# Patient Record
Sex: Male | Born: 1960 | Race: White | Hispanic: No | Marital: Married | Smoking: Current every day smoker
Health system: Southern US, Community
[De-identification: ages and names within clinical notes are randomized; demographics above are authoritative.]

## PROBLEM LIST (undated history)

## (undated) DIAGNOSIS — F32A Depression, unspecified: Secondary | ICD-10-CM

## (undated) DIAGNOSIS — J449 Chronic obstructive pulmonary disease, unspecified: Secondary | ICD-10-CM

## (undated) DIAGNOSIS — K219 Gastro-esophageal reflux disease without esophagitis: Secondary | ICD-10-CM

## (undated) DIAGNOSIS — N200 Calculus of kidney: Secondary | ICD-10-CM

## (undated) DIAGNOSIS — I10 Essential (primary) hypertension: Secondary | ICD-10-CM

## (undated) DIAGNOSIS — I639 Cerebral infarction, unspecified: Secondary | ICD-10-CM

## (undated) DIAGNOSIS — M1990 Unspecified osteoarthritis, unspecified site: Secondary | ICD-10-CM

## (undated) DIAGNOSIS — F329 Major depressive disorder, single episode, unspecified: Secondary | ICD-10-CM

## (undated) HISTORY — DX: Cerebral infarction, unspecified: I63.9

## (undated) HISTORY — PX: CHOLECYSTECTOMY: SHX55

## (undated) HISTORY — DX: Depression, unspecified: F32.A

## (undated) HISTORY — DX: Major depressive disorder, single episode, unspecified: F32.9

## (undated) HISTORY — DX: Chronic obstructive pulmonary disease, unspecified: J44.9

## (undated) HISTORY — DX: Essential (primary) hypertension: I10

---

## 1998-05-09 ENCOUNTER — Inpatient Hospital Stay (HOSPITAL_COMMUNITY): Admission: EM | Admit: 1998-05-09 | Discharge: 1998-05-11 | Payer: Self-pay | Admitting: Emergency Medicine

## 1998-05-20 ENCOUNTER — Encounter: Admission: RE | Admit: 1998-05-20 | Discharge: 1998-05-20 | Payer: Self-pay | Admitting: Hematology and Oncology

## 1998-10-29 ENCOUNTER — Emergency Department (HOSPITAL_COMMUNITY): Admission: EM | Admit: 1998-10-29 | Discharge: 1998-10-29 | Payer: Self-pay | Admitting: Emergency Medicine

## 1998-10-29 ENCOUNTER — Encounter: Payer: Self-pay | Admitting: Emergency Medicine

## 1999-01-06 ENCOUNTER — Emergency Department (HOSPITAL_COMMUNITY): Admission: EM | Admit: 1999-01-06 | Discharge: 1999-01-06 | Payer: Self-pay | Admitting: Emergency Medicine

## 2000-01-20 ENCOUNTER — Emergency Department (HOSPITAL_COMMUNITY): Admission: EM | Admit: 2000-01-20 | Discharge: 2000-01-20 | Payer: Self-pay | Admitting: Emergency Medicine

## 2001-12-24 ENCOUNTER — Encounter: Payer: Self-pay | Admitting: Emergency Medicine

## 2001-12-24 ENCOUNTER — Inpatient Hospital Stay (HOSPITAL_COMMUNITY): Admission: EM | Admit: 2001-12-24 | Discharge: 2001-12-25 | Payer: Self-pay | Admitting: Emergency Medicine

## 2004-01-29 ENCOUNTER — Emergency Department (HOSPITAL_COMMUNITY): Admission: EM | Admit: 2004-01-29 | Discharge: 2004-01-29 | Payer: Self-pay | Admitting: Emergency Medicine

## 2004-08-09 ENCOUNTER — Emergency Department (HOSPITAL_COMMUNITY): Admission: EM | Admit: 2004-08-09 | Discharge: 2004-08-09 | Payer: Self-pay | Admitting: Emergency Medicine

## 2004-08-11 ENCOUNTER — Inpatient Hospital Stay (HOSPITAL_COMMUNITY): Admission: EM | Admit: 2004-08-11 | Discharge: 2004-08-13 | Payer: Self-pay

## 2004-11-18 ENCOUNTER — Ambulatory Visit: Payer: Self-pay | Admitting: Internal Medicine

## 2004-11-18 ENCOUNTER — Inpatient Hospital Stay (HOSPITAL_COMMUNITY): Admission: EM | Admit: 2004-11-18 | Discharge: 2004-11-21 | Payer: Self-pay | Admitting: Emergency Medicine

## 2004-11-24 ENCOUNTER — Ambulatory Visit (HOSPITAL_COMMUNITY): Admission: RE | Admit: 2004-11-24 | Discharge: 2004-11-24 | Payer: Self-pay | Admitting: Internal Medicine

## 2005-02-01 ENCOUNTER — Emergency Department (HOSPITAL_COMMUNITY): Admission: EM | Admit: 2005-02-01 | Discharge: 2005-02-01 | Payer: Self-pay | Admitting: Emergency Medicine

## 2005-02-02 ENCOUNTER — Emergency Department (HOSPITAL_COMMUNITY): Admission: EM | Admit: 2005-02-02 | Discharge: 2005-02-03 | Payer: Self-pay | Admitting: Emergency Medicine

## 2005-10-28 ENCOUNTER — Emergency Department (HOSPITAL_COMMUNITY): Admission: EM | Admit: 2005-10-28 | Discharge: 2005-10-28 | Payer: Self-pay | Admitting: Emergency Medicine

## 2006-08-07 ENCOUNTER — Inpatient Hospital Stay (HOSPITAL_COMMUNITY): Admission: EM | Admit: 2006-08-07 | Discharge: 2006-08-11 | Payer: Self-pay | Admitting: Emergency Medicine

## 2006-08-08 ENCOUNTER — Encounter (INDEPENDENT_AMBULATORY_CARE_PROVIDER_SITE_OTHER): Payer: Self-pay | Admitting: *Deleted

## 2010-09-29 ENCOUNTER — Emergency Department (HOSPITAL_COMMUNITY)
Admission: EM | Admit: 2010-09-29 | Discharge: 2010-09-29 | Payer: Self-pay | Source: Home / Self Care | Admitting: Emergency Medicine

## 2010-10-03 ENCOUNTER — Emergency Department (HOSPITAL_COMMUNITY)
Admission: EM | Admit: 2010-10-03 | Discharge: 2010-10-03 | Payer: Self-pay | Source: Home / Self Care | Admitting: Emergency Medicine

## 2010-12-30 ENCOUNTER — Emergency Department (HOSPITAL_COMMUNITY)
Admission: EM | Admit: 2010-12-30 | Discharge: 2010-12-30 | Disposition: A | Discharge status: Home / Self Care | Payer: Self-pay | Attending: Emergency Medicine | Admitting: Emergency Medicine

## 2010-12-30 ENCOUNTER — Emergency Department (HOSPITAL_COMMUNITY): Payer: Self-pay

## 2010-12-30 DIAGNOSIS — R05 Cough: Secondary | ICD-10-CM | POA: Insufficient documentation

## 2010-12-30 DIAGNOSIS — K219 Gastro-esophageal reflux disease without esophagitis: Secondary | ICD-10-CM | POA: Insufficient documentation

## 2010-12-30 DIAGNOSIS — J209 Acute bronchitis, unspecified: Secondary | ICD-10-CM | POA: Insufficient documentation

## 2010-12-30 DIAGNOSIS — R059 Cough, unspecified: Secondary | ICD-10-CM | POA: Insufficient documentation

## 2011-01-04 LAB — URINE MICROSCOPIC-ADD ON

## 2011-01-04 LAB — HEPATIC FUNCTION PANEL
ALT: 18 U/L (ref 0–53)
AST: 17 U/L (ref 0–37)
Albumin: 4 g/dL (ref 3.5–5.2)
Alkaline Phosphatase: 75 U/L (ref 39–117)
Bilirubin, Direct: 0.1 mg/dL (ref 0.0–0.3)
Indirect Bilirubin: 0.7 mg/dL (ref 0.3–0.9)
Total Bilirubin: 0.8 mg/dL (ref 0.3–1.2)
Total Protein: 8 g/dL (ref 6.0–8.3)

## 2011-01-04 LAB — BASIC METABOLIC PANEL
BUN: 24 mg/dL — ABNORMAL HIGH (ref 6–23)
BUN: 30 mg/dL — ABNORMAL HIGH (ref 6–23)
CO2: 21 mEq/L (ref 19–32)
CO2: 25 mEq/L (ref 19–32)
Calcium: 9.4 mg/dL (ref 8.4–10.5)
Calcium: 9.8 mg/dL (ref 8.4–10.5)
Chloride: 102 mEq/L (ref 96–112)
Chloride: 103 mEq/L (ref 96–112)
Creatinine, Ser: 0.91 mg/dL (ref 0.4–1.5)
Creatinine, Ser: 1.05 mg/dL (ref 0.4–1.5)
GFR calc Af Amer: >60 mL/min (ref >60)
GFR calc Af Amer: >60 mL/min (ref >60)
GFR calc non Af Amer: >60 mL/min (ref >60)
GFR calc non Af Amer: >60 mL/min (ref >60)
Glucose, Bld: 95 mg/dL (ref 70–99)
Glucose, Bld: 97 mg/dL (ref 70–99)
Potassium: 3.8 mEq/L (ref 3.5–5.1)
Potassium: 4.1 mEq/L (ref 3.5–5.1)
Sodium: 137 mEq/L (ref 135–145)
Sodium: 140 mEq/L (ref 135–145)

## 2011-01-04 LAB — URINALYSIS, ROUTINE W REFLEX MICROSCOPIC
Glucose, UA: NEGATIVE mg/dL
Ketones, ur: 40 mg/dL — AB
Leukocytes, UA: NEGATIVE
Nitrite: NEGATIVE
Protein, ur: 30 mg/dL — AB
Specific Gravity, Urine: >1.03 — ABNORMAL HIGH (ref 1.005–1.030)
Urobilinogen, UA: 1 mg/dL (ref 0.0–1.0)
pH: 6 (ref 5.0–8.0)

## 2011-01-04 LAB — CBC
HCT: 44.7 % (ref 39.0–52.0)
Hemoglobin: 16.2 g/dL (ref 13.0–17.0)
MCH: 32 pg (ref 26.0–34.0)
MCHC: 36.2 g/dL — ABNORMAL HIGH (ref 30.0–36.0)
MCV: 88.2 fL (ref 78.0–100.0)
Platelets: 303 10*3/uL (ref 150–400)
RBC: 5.07 MIL/uL (ref 4.22–5.81)
RDW: 12.8 % (ref 11.5–15.5)
WBC: 9 10*3/uL (ref 4.0–10.5)

## 2011-01-04 LAB — DIFFERENTIAL
Basophils Absolute: 0 10*3/uL (ref 0.0–0.1)
Basophils Relative: 0 % (ref 0–1)
Eosinophils Absolute: 0 10*3/uL (ref 0.0–0.7)
Eosinophils Relative: 0 % (ref 0–5)
Lymphocytes Relative: 20 % (ref 12–46)
Lymphs Abs: 1.8 10*3/uL (ref 0.7–4.0)
Monocytes Absolute: 0.5 10*3/uL (ref 0.1–1.0)
Monocytes Relative: 5 % (ref 3–12)
Neutro Abs: 6.7 10*3/uL (ref 1.7–7.7)
Neutrophils Relative %: 75 % (ref 43–77)

## 2011-01-04 LAB — LIPASE, BLOOD: Lipase: 24 U/L (ref 11–59)

## 2011-03-12 NOTE — H&P (Signed)
[name], [name]                  ACCOUNT NO.:  [id number]   MEDICAL RECORD NO.:  [id number]          PATIENT TYPE:  INP   LOCATION:  1826                         FACILITY:  [facility]   PHYSICIAN:  Kela Millin, M.D.DATE OF BIRTH:  02-22-1961   DATE OF ADMISSION:  08/06/2006  DATE OF DISCHARGE:                                HISTORY & PHYSICAL   CHIEF COMPLAINT:  Persistent nausea and vomiting.   HISTORY OF PRESENT ILLNESS:  The patient is a 50 year old white male with  past medical history significant for GERD. He states that he just started  taking erythromycin today for ? bronchitis (patient is unclear of  diagnosis). He presents with worsening nausea and vomiting x1 day. He states  that he has been vomiting about every 35-45 minutes and that he has also  been having associated abdominal pain lasting about an hour at a time,  crampy in nature, 10/10 in intensity and diffuse. He denies fevers, chest  pain, dysuria, diarrhea, melena and hematemesis. He admits to a mild cough  productive of phlegm. He was seen in the ER and a Gastroccult was positive.  His lipase was within normal limits at 20. Urinalysis was unremarkable. LFTs  within normal limits. His white cell count 12.3 and his H&H stable at 17.5  and 51.2. He is admitted to the [facility] for further  evaluation and management. The patient had abdominal/chest x-ray done which  showed no acute cardiopulmonary disease, nonspecific bowel gas pattern, no  obstruction/free air, status post cholecystectomy noted.   PAST MEDICAL HISTORY:  1. As stated above.  2. History of kidney stones.  3. History of COPD.   MEDICATIONS:  1. Prilosec.  2. Erythromycin.  3. Rondec DM.   ALLERGIES:  PENICILLIN.   SOCIAL HISTORY:  He states that he quit tobacco 1 week ago. He denies  alcohol.   FAMILY HISTORY:  His dad has hypertension and heart problems.   REVIEW OF SYSTEMS:  As per HPI. Other review of systems  negative.   PHYSICAL EXAMINATION:  GENERAL:  The patient is a middle-aged white male. He  is alert and oriented, in no apparent distress.  VITAL SIGNS:  His temperature is 97.2, blood pressure 146/90, pulse 93, O2  sat of 99%.  HEENT:  PERRL. EOMI. Dry mucous membranes. No oral exudates.  NECK:  Supple. No adenopathy. No JVD.  LUNGS:  Moderate air movement. No crackles and no wheezes.  CARDIOVASCULAR:  Regular rate and rhythm. Normal S1 and S2.  ABDOMEN:  Epigastric tenderness. Bowel sounds present. Nondistended. No  organomegaly. No rebound tenderness. No masses palpable.  EXTREMITIES:  No cyanosis and no edema.  NEURO:  Alert and oriented x3. Cranial nerves 2-12 grossly intact. Nonfocal  exam.   LABORATORY DATA:  White cell count is 12.3, hemoglobin 17.5, hematocrit  51.2, platelet count 271, neutrophil count 85%. Sodium is 137, potassium  3.7, chloride 101, CO2 18, glucose 114, BUN 35, creatinine 1.1. His LFTs are  unremarkable. Lipase is 20. Gastroccult is positive. Urinalysis is  unremarkable. The chest/abdominal x-rays show no acute  cardiopulmonary  disease, nonspecific bowel gas pattern, no obstruction/free air, status post  chole.   ASSESSMENT/PLAN:  1. Persistent nausea and vomiting with abdominal pain. Mostly epigastric      on exam. As discussed above he has a history of gastroesophageal reflux      disease and also was just started on erythromycin prior to symptom      onset. I will keep the patient nothing by mouth, discontinue      erythromycin. The abdominal films show a nonspecific gas pattern with      no obstruction or free air. I will place the patient on a proton pump      inhibitor and Reglan. Per emergency room emesis, Gastroccult positive.      Will follow hemoglobin and hematocrit. Also noted above the lipase is      within normal limits, urinalysis is unremarkable and the patient is      status post cholecystectomy. Will check cardiac enzymes. Follow and       then consider Gastroenterology consult. Will follow up hemoglobins.  2. History of chronic obstructive pulmonary disease, as needed      bronchodilators.  3. Azotemia, secondary to #1. Hydrate and follow.      Kela Millin, M.D.  Electronically Signed     ACV/MEDQ  D:  08/07/2006  T:  08/07/2006  Job:  [id number]

## 2011-03-12 NOTE — Consult Note (Signed)
[name], [name]                  ACCOUNT NO.:  [id number]   MEDICAL RECORD NO.:  [id number]          PATIENT TYPE:  INP   LOCATION:  A323                          FACILITY:  [facility]   PHYSICIAN:  Lionel December, M.D.    DATE OF BIRTH:  [birth year]   DATE OF CONSULTATION:  11/19/2004  DATE OF DISCHARGE:                                   CONSULTATION   REASON FOR CONSULTATION:  Abnormal ultrasound.   HISTORY OF PRESENT ILLNESS:  The patient is a 50 year old Caucasian male who  was admitted with a two day history of epigastric pain, nausea and vomiting,  and possible coffee-ground emesis.  He states Tuesday he developed a severe  headache.  He went to bed and woke up at 1 a.m. in the morning with multiple  episodes of vomiting.  His symptoms persisted throughout Wednesday and  actually worsened.  He came into the emergency department, where he was  evaluated.  He vomited some brownish emesis.  His stool was heme-negative by  rectal examination in the ED.  Today he has no further nausea and vomiting  or abdominal pain.  He states he has had abdominal problems for quite some  time.  He had his gallbladder out two years ago in [place] for  cholelithiasis.  He has had at least two upper endoscopies at [facility], which he reports to be normal.  He also reports a normal  colonoscopy at some point.  He does have gastroesophageal reflux disease and  takes intermittent PPI therapy when he can afford it or obtain samples.  Currently he is on ranitidine 75 mg three times a day.   He had an abdominal ultrasound today, which revealed mild intrahepatic  biliary dilatation and mild hepatomegaly.  The common bile duct measured 5.5  mm.  There was mild fullness of the pancreatic duct and a possible small  stone in the common bile duct of the pancreatic head.  He is status post  cholecystectomy.  His LFTs have been normal twice since admission.  His  amylase and lipase are also  normal.  His urine drug screen was positive for  opiates and tetrahydrocannabinoids.  Notably, he had an abdominal ultrasound  in October 2005, which was unremarkable.   MEDICATIONS PRIOR TO ADMISSION:  Ranitidine 75 mg t.i.d. p.r.n.   ALLERGIES:  PENICILLIN causes hives.   PAST MEDICAL HISTORY:  1.  Gastroesophageal reflux disease.  2.  He was admitted for acute gastroenteritis in October 1975.  3.  He has a history of kidney stones.  4.  Status post cholecystectomy as outlined above.   FAMILY HISTORY:  Significant for leukemia but no GI illnesses, liver  disease, or colorectal cancer.   SOCIAL HISTORY:  He is married to his second wife.  He has two biological  children from his first wife.  He denies any alcohol use, although he was a  heavy user in the past.  He quit over 16 years ago.  He smokes two packs of  cigarettes daily and has smoked for most of his life.  He occasionally  smokes marijuana.  He denies any other drug use.   REVIEW OF SYSTEMS:  GASTROINTESTINAL :  See HPI.  In addition, he denies any  melena or rectal bleeding, constipation, diarrhea, or dysphasia.  CARDIOPULMONARY:  He denies any chest pain or shortness of breath.  GENITOURINARY:  He denies any dysuria.   PHYSICAL EXAMINATION:  VITAL SIGNS:  Temperature 98.3, pulse 100,  respirations 20, blood pressure 159/81.  Height 68 inches, weight 156.5.  GENERAL:  A pleasant, thin, Caucasian male in no acute distress.  SKIN:  Warm and dry.  No jaundice.  HEENT:  Conjunctivae were pink.  Sclerae nonicteric.  Oropharyngeal mucosa  moist and pink.  NECK:  No lymphadenopathy.  LUNGS:  Clear to auscultation after multiple coughs.  CARDIAC:  Reveals a regular rate and rhythm.  No murmurs, rubs, or gallops.  ABDOMEN:  Positive bowel sounds.  Soft, nontender, nondistended.  No  organomegaly or masses.  EXTREMITIES:  No edema.   LABORATORIES:  WBC 10,000 down from 12,700 on admission, hemoglobin 16.1,  hematocrit  45.3, platelets 287,000.  Total bilirubin 0.5, alkaline  phosphatase 72, AST 24, ALT 26, albumin 4.2, amylase 122, lipase 15.  Please  note LFTs were also normal on admission.  Sodium 132, potassium 3.5, BUN 16,  creatinine 1, glucose 121.  Urine drug screen positive opiates and  marijuana.  Alcohol level less than 5.  Urinalysis positive for ketones and  protein.  Acute abdominal series negative.   IMPRESSION:  The patient is a 50 year old gentleman with epigastric pain and  nausea and vomiting felt to be due to biliary colic.  An abdominal  ultrasound report suggest choledocholithiasis.  This would explain his  symptoms.  The patient is currently asymptomatic.  His liver function tests,  amylase, and lipase have been normal.  Given the liver function tests were  normal, endoscopic retrograde cholangiopancreatography would not be  indicated at this time.   RECOMMENDATIONS:  1.  Recommend and MRCP given his LFTs have been normal.  If he is found to      indeed have a common bile duct stone, he will then need to have an ERCP      for therapeutic measures.  2.  We will allow him to have a clear liquid diet.   I would like to thank Dr. Felecia Shelling for allowing [place] to take part in the care of  this patient.      LL/MEDQ  D:  11/19/2004  T:  11/19/2004  Job:  [id number]

## 2011-03-12 NOTE — Discharge Summary (Signed)
NAME:  [name], [name]                  ACCOUNT NO.:  [id number]   MEDICAL RECORD NO.:  [id number]          PATIENT TYPE:  INP   LOCATION:  A323                          FACILITY:  [facility]   PHYSICIAN:  Tesfaye D. Felecia Shelling, MD   DATE OF BIRTH:  Jan 19, 1961   DATE OF ADMISSION:  11/18/2004  DATE OF DISCHARGE:  01/28/2006LH                                 DISCHARGE SUMMARY   DISCHARGE DIAGNOSES:  1.  Nausea and vomiting, probably secondary to viral gastroenteritis.  2.  History of kidney stone.  3.  Gastroesophageal reflux disease.   DISCHARGE MEDICATIONS:  Protonix 40 mg p.o. q.d.   DISPOSITION:  The patient was discharged home in stable condition.   HOSPITAL COURSE:  This is a 50 year old male patient with no significant  past medical history who was admitted to nausea and vomiting. He was started  on IV fluids and was kept NPO. A GI consult was done. Ultrasound of the  abdomen showed mild dilatation of common bile duct; however, MRCP was  normal. The patient's symptoms resolved. He was back to his baseline. The  patient was evaluated and followed by GI. He was discharged in stable  condition. The patient is advised to be further evaluated by  gastroenterologist.      TDF/MEDQ  D:  12/16/2004  T:  12/16/2004  Job:  [id number]

## 2011-03-12 NOTE — Discharge Summary (Signed)
[name], [name]                  ACCOUNT NO.:  [id number]   MEDICAL RECORD NO.:  [id number]          PATIENT TYPE:  INP   LOCATION:  5738                         FACILITY:  [facility]   PHYSICIAN:  Hollice Espy, M.D.DATE OF BIRTH:  [birth year]   DATE OF ADMISSION:  08/06/2006  DATE OF DISCHARGE:  08/11/2006                                 DISCHARGE SUMMARY   CONSULTATIONS:  Dr. Jeanie Sewer of psychiatry and Dr. Elnoria Howard of GI.   DISCHARGE DIAGNOSES:  1. Esophagitis.  2. Gastritis.  3. Nonsteroidal antiinflammatory drugs causing numbers 1 and 2.  4. Benign-appearing 1.7 cm mass on liver unclear by MRI and CT __________      follow up.  5. Depressive disorder.  6. Chronic obstructive pulmonary disease.  7. Tobacco use.  8. Acute renal failure, now resolved.   HOSPITAL COURSE:  The patient is a 50 year old white male who comes with a  history of COPD and frequent use of Goody powder who presented with  localized abdominal pain that was mid epigastric __________ renal failure.  He was admitted for IV hydration __________  treatment and evaluation of  __________  pain.  The patient also had a history of __________      Hollice Espy, M.D.  Electronically Signed     SKK/MEDQ  D:  08/11/2006  T:  08/12/2006  Job:  [id number]

## 2011-03-12 NOTE — Discharge Summary (Signed)
[name], [name] NO.:  [id number]   MEDICAL RECORD NO.:  [id number]           PATIENT TYPE:   LOCATION:                                 FACILITY:   PHYSICIAN:  Hollice Espy, M.D.    DATE OF BIRTH:   DATE OF ADMISSION:  DATE OF DISCHARGE:                               DISCHARGE SUMMARY   DISCHARGE DIAGNOSES:  1. Severe reflux secondary to gastritis.  2. Depression.  3. Chronic obstructive pulmonary disease, stable.  4. Hypertension.   DISCHARGE MEDICATIONS:  1. Prilosec to be increased to 2 times a day for the next month.  2. Percocet 5/325 p.o. q. 8h. p.r.n.  3. Stop all use of  Goody's powder or any other form of NSAID powder.  4. Continue Ventolin and Atrovent inhalers.   ACTIVITY:  As tolerated.   DIET:  Regular diet.   FOLLOWUP:  With his PCP in [place] in 1 to 2 weeks.  As well, [facility] [place] will call him for an appointment to schedule  for help with his depression.   HOSPITAL COURSE:  The patient is a 50 year old white male with a past  medical history of hypertension and depression, previous GERD, who  presented to the emergency room complaining of severe abdominal pain and  epigastric burning.  He also had some nausea and vomiting, and there was  a concern that he may have an abdominal ulcer.  An EGD was done with  antral biopsies showing a narrow esophagus, a small hiatal hernia and  diffuse gastritis but no evidence of any ulceration.  CT abdomen and  pelvis was done which was unremarkable.  It was felt by GI that likely  he had severe gastritis and they recommended doubling his PPI.  In the  meantime, the patient still continued to feel overwhelmed with his  depression issues.  He was evaluated by Psych who recommended treatment  of his chronic headaches and counseling.  Counseling appointments were  set up.  The rest of his issues were stable, and he was discharged home.      Hollice Espy, M.D.  Electronically Signed    SKK/MEDQ  D:  03/07/2007  T:  03/07/2007  Job:  [id number]   cc:   Antonietta Breach, M.D.  Jordan Hawks Elnoria Howard, MD

## 2011-03-12 NOTE — Discharge Summary (Signed)
NAME:  [name], CELLI                  ACCOUNT NO.:  [id number]   MEDICAL RECORD NO.:  [id number]          PATIENT TYPE:  INP   LOCATION:  A330                          FACILITY:  [facility]   PHYSICIAN:  Tesfaye D. Felecia Shelling, MD   DATE OF BIRTH:  04-08-1961   DATE OF ADMISSION:  08/11/2004  DATE OF DISCHARGE:  LH                                 DISCHARGE SUMMARY   CHIEF COMPLAINT:  Nausea and vomiting of three days duration.   HISTORY OF PRESENT ILLNESS:  This is a 50 year old male patient with no  significant past medical history who came to the emergency room with the  above complaint.  The patient had sudden onset of nausea and vomiting four  days back.  He came to the emergency room where he was evaluated, and was  treated and sent home with Phenergan suppository.  For two days, his  symptoms subsided.  Yesterday while he was at work, the patient started  having recurrent nausea and vomiting.  He vomited more than 20 times.  The  patient became very weak and was brought to the emergency room.  He was  evaluated and was started on IV fluid.  His routine labs including amylase  and lipase became within the normal limits.  The patient was admitted for  further treatment.   REVIEW OF SYSTEMS:  The patient has no fever, chills, cough, chest pain,  palpitations, abdominal pain, diarrhea, dysuria, urgency or frequency of  urination.   PAST MEDICAL HISTORY:  The patient has no chronic known medical illness.   MEDICATIONS:  The patient is not on any prescription medications at this  time.   SOCIAL HISTORY:  The patient is self employed.  He smokes about three packs  of cigarettes a day.  No history of alcohol or substance abuse.  The patient  is married.   PHYSICAL EXAMINATION:  GENERAL:  The patient is alert, awake, sick looking.  VITAL SIGNS: Blood pressure 125/93, pulse 103, respiratory rate 24,  temperature 98.1 degrees Fahrenheit.  HEENT:  Pupils are equal and reactive.  NECK:  Supple.  CHEST:  Clear lung field.  Good air entry.  CARDIOVASCULAR:  First and second heart sounds heard.  No murmur, no gallop.  ABDOMEN:  Soft and relaxed.  Bowel sounds are positive.  No mass, no  organomegaly.  No area of tenderness.  EXTREMITIES:  No leg edema.   LABORATORY DATA:  On admission, WBC 12.4, hemoglobin 10.3, hematocrit 47.7.  Platelets 249.  Sodium 135.  Potassium 3.7.  Chloride 99.  Carbon dioxide  28.  Glucose 122.  BUN 28, creatinine 1.2.  Total bilirubin 0.9.  Alkaline  phosphatase 24, AST 20, ALT 19.  Total protein 7.4.  Albumin 4.1.  Calcium  9.2.  Amylase 90.  Lipase 18.  Urinalysis:  Specific gravity 1.020, pH  greater than 9.  Ketones positive.  Protein 30.  The wbc's 0-2 and rbc's 0-  2.   ASSESSMENT:  Acute gastroenteritis, probably viral in etiology.   PLAN:  1.  We will continue the patient on IV  fluid.  2.  We will keep him n.p.o. until his symptoms improve.  3.  We will do an ultrasound of the abdomen.  4.  We will continue the patient on Phenergan 25 mg IV q.6h.     Tesf   TDF/MEDQ  D:  08/12/2004  T:  08/12/2004  Job:  [id number]

## 2011-03-12 NOTE — H&P (Signed)
[facility]  Patient:    [name], [name] Visit Number: [id number] MRN: [id number]          Service Type: MED Location: 5000 [id number] Attending Physician:  Katy Apo. Dictated by:   Wayne C. Dorna Bloom, M.D. Admit Date:  12/24/2001   CC:         Renford Dills, M.D.   History and Physical  CHIEF COMPLAINT:  Vomiting.  HISTORY OF PRESENT ILLNESS:  This is a 50 year old white male who complains of unrelenting vomiting for two weeks, occasional hematemesis, no melena or bowel changes, no bloody bowel movements, positive reflux symptoms, mild lower abdominal discomfort.  He denies fever or weight loss.  Went to a [facility] hospital five times over last week, admitted overnight x1.  Had rectal exam, ultrasound and EGD which were apparently unremarkable.  Patient has been on Phenergan and Prevacid without good effect.  PAST MEDICAL HISTORY:  Unremarkable otherwise.  PAST SURGICAL HISTORY:  None.  REVIEW OF SYSTEMS:  Otherwise negative.  No respiratory or urinary complaints. No reports or eating spoiled foods, antibiotic use, travel outside of the area or recent exposure to illness.  Patient denies headache, dizziness, vision changes, stiff neck.  SOCIAL HISTORY:  Patient is married with children and lives in [place], [place].  Parents [place] in [place].  He is a former smoker. Denies alcohol or recreational drug use.  FAMILY MEDICAL HISTORY:  Father with cardiovascular disease.  PREVIOUS MEDICATIONS:  None prior to this episode.  ALLERGIES:  PENICILLIN, which causes hives.  PHYSICAL EXAMINATION:  GENERAL:  Patient looks well and in no distress whatsoever.  VITAL SIGNS:  Temperature is 97.3, blood pressure is 131/103, pulse is 109 and regular, respiratory rate of 24.  HEENT:  Negative.  CARDIOVASCULAR:  Regular rate and rhythm without murmurs, rubs, or gallops.  LUNGS:  Clear to auscultation bilaterally.  ABDOMEN:   Soft.  Normoactive bowel sounds.  Nontender.  Without organomegaly or masses felt.  RECTAL:  He is heme-negative.  EXTREMITIES:  No clubbing, cyanosis, or edema.  NEUROLOGIC:  Motor strength is 5/5 bilaterally.  Sensation intact to light touch and pinprick.  Cranial nerves II-XII are intact.  LABORATORY AND ACCESSORY DATA:  CT scan shows tiny punctate nonobstructing right renal calculus.  Bowels are negative.  Appendix negative.  Degenerative changes in LS spine.  Disk bulge and right paracentral herniated nucleus pulposus, L4-L5.  Labs showed a sodium of 132, potassium 3.8, BUN 24, creatinine 1.1, chloride 103, CO2 19, glucose of 69.  Liver function tests were essentially normal. Amylase and lipase are normal.  CBC:  White count 10.2, hemoglobin 16.9, hematocrit 50.9, platelet count of 232,000.  Urinalysis showed 80 ketones, total protein 30, negative nitrite, negative hemoglobin, 0-2 white cells, 0-2 red cells and rare bacteria.  ASSESSMENT AND PLAN:  Unrelenting nausea and vomiting, questionable gastroenteritis.  Workup apparently unrevealing so far.  Review the records from [place].  Questionable repeat workup if necessary.  Give intravenous fluids and intravenous Zofran for now.  Gastrointestinal consult considered. Dictated by:   Wayne C. Dorna Bloom, M.D. Attending Physician:  Renford Dills D. DD:  12/25/01 TD:  12/25/01 Job: [id number]

## 2011-03-12 NOTE — H&P (Signed)
NAMEDANNEL, RAFTER                  ACCOUNT NO.:  [id number]   MEDICAL RECORD NO.:  [id number]          PATIENT TYPE:  INP   LOCATION:  A209                          FACILITY:  [facility]   PHYSICIAN:  Tesfaye D. Felecia Shelling, MD   DATE OF BIRTH:  09/13/1961   DATE OF ADMISSION:  11/18/2004  DATE OF DISCHARGE:  LH                                HISTORY & PHYSICAL   CHIEF COMPLAINT:  Nausea/vomiting.   HISTORY OF PRESENT ILLNESS:  This is a 50 year old male patient who has no  significant past medical history.  Came to the emergency room with above  complaints.  The patient developed the sudden onset of nausea and vomiting  over two days.  These symptoms started getting worse as of yesterday.  He  had severe recurrent vomiting yesterday.  He came to the emergency room  where he was evaluated.  The patient claims his vomiting is coffee grounds  in appearance, however, there was no drop in his hemoglobin or hematocrit.  The patient has a similar history in the past for which he was admitted and  treated for gastroenteritis.   REVIEW OF SYSTEMS:  There are no fevers, chills, chest pain, cough,  abdominal pain, diarrhea, melena or hematemesis.  No dysuria or frequency of  urination.   PAST MEDICAL HISTORY:  History of acute viral gastroenteritis.   CURRENT MEDICATIONS:  The patient is not on any regular medications.   SOCIAL HISTORY:  The patient smokes cigarettes heavily.  He is married.  No  history of alcohol or substance abuse.   PHYSICAL EXAMINATION:  GENERAL:  The patient is alert, awake, and sick  looking.  VITALS:  Blood pressure 130/86, pulse 110, respiratory rate 20, temperature  97.7 degrees Fahrenheit.  HEENT:  Pupils are equal, reactive.  NECK:  Supple.  CHEST:  Decreased air entry.  A few rhonchi.  CARDIOVASCULAR:  First and second heart sounds heard.  No murmur, no gallop.  ABDOMEN:  Soft.  Bowel sounds are positive.  No organomegaly.  EXTREMITIES:  No leg edema.   LABS ON  ADMISSION:  CBC:  WBC 12.7, hemoglobin 16.4, hematocrit 46.6,  platelets 288.  BNP:  Sodium 132, potassium 3.7, chloride 99, CO2 24, sodium  121, BUN 16, creatinine 1, bilirubin 0.9, alkaline phosphatase 74, AST 28,  ALT 27, total protein 6.9, albumin 4.6, calcium 9.3, __________ 5.   ASSESSMENT:  This is a 50 year old male patient with no significant past  medical history, admitted with a history of nausea and vomiting.  The  patient's stool guaiac is negative and there is no drop in his  hemoglobin/hematocrit.  The patient probably has viral gastroenteritis.  It  is less likely to be a GI bleed.   PLAN:  Will continue patient on IV fluids.  We will keep him NPO.  We will  give Protonix 40 mg IV piggyback daily.  We will do an amylase and a lipase  level.  We will do an ultrasound of the abdomen.  We will get a GI consult.      TDF/MEDQ  D:  11/19/2004  T:  11/19/2004  Job:  [id number]
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
## 2011-03-12 NOTE — Op Note (Signed)
NAMEBRAELIN, COSTLOW NO.:  [id number]   MEDICAL RECORD NO.:  [id number]          PATIENT TYPE:  INP   LOCATION:  5738                         FACILITY:  [facility]   PHYSICIAN:  Anselmo Rod, M.D.  DATE OF BIRTH:  1961-08-05   DATE OF PROCEDURE:  DATE OF DISCHARGE:                                 OPERATIVE REPORT   PROCEDURE PERFORMED:  Esophagogastroduodenoscopy with antral biopsies.   ENDOSCOPIST:  Anselmo Rod, M.D.   INSTRUMENT USED:  Olympus video panendoscope.   INDICATIONS FOR PROCEDURE:  The patient is a 50 year old white male with a  history of nausea and vomiting.  The patient has a longstanding history of  Goody powder and BC powder use.  Rule out peptic ulcer disease, esophagitis,  gastritis, etc.   PREPROCEDURE PREPARATION:  Informed consent was procured from the patient.  The patient was fasted for four hours prior to the procedure.  Risks and  benefits of the procedure were discussed with the patient in great detail.   PREPROCEDURE PHYSICAL:  The patient had stable vital signs.  Neck supple,  chest clear to auscultation.  S1, S2 regular.  Abdomen soft with normal  bowel sounds.   DESCRIPTION OF PROCEDURE:  The patient was placed in the left lateral  decubitus position and sedated with fentanyl and Versed.  Once the patient  was adequately sedated and maintained on low-flow oxygen and continuous  cardiac monitoring, the Olympus video panendoscope was advanced through the  mouth piece over the tongue into the esophagus under direct vision.  The  entire esophagus was widely patent with no evidence of ring, stricture,  masses, esophagitis or Barrett's mucosa.  The scope was then advanced to the  stomach.  Diffuse gastritis was noted and biopsies were done to rule out  presence of Helicobacter pylori by pathology.  A hiatal hernia was seen on  high retroflexion.  Mild duodenitis was noted in the duodenal bulb.  There  was no evidence of ulcer  disease.  The proximal small bowel distal to the  bulb appeared normal.  There was no outlet obstruction.  The patient  tolerated the procedure well without complications.   IMPRESSION:  1. Normal-appearing esophagus with no evidence of ring, stricture, masses,      esophagitis or Barrett's mucosa.  2. Hiatal hernia present.  3. Antral biopsies done for Helicobacter pylori.  4. Mild duodenitis in the bulb.  5. Normal small bowel distal to the bulb up to 60 cm.   RECOMMENDATIONS:  1. CT scan of the abdomen should also be done to further evaluate the      patient's problem with nausea and vomiting to rule out gastric outlet      obstruction.  2. Continue serial CBCs.  3. Patient has been advised to avoid all nonsteroidals for now.  4. Agree with proton pump inhibitor.      Anselmo Rod, M.D.  Electronically Signed     JNM/MEDQ  D:  08/09/2006  T:  08/11/2006  Job:  [id number]

## 2011-04-13 ENCOUNTER — Emergency Department (HOSPITAL_COMMUNITY)
Admission: EM | Admit: 2011-04-13 | Discharge: 2011-04-13 | Disposition: A | Discharge status: Home / Self Care | Payer: Self-pay | Attending: Emergency Medicine | Admitting: Emergency Medicine

## 2011-04-13 DIAGNOSIS — K2970 Gastritis, unspecified, without bleeding: Secondary | ICD-10-CM | POA: Insufficient documentation

## 2011-04-13 DIAGNOSIS — R1013 Epigastric pain: Secondary | ICD-10-CM | POA: Insufficient documentation

## 2011-04-13 DIAGNOSIS — J4489 Other specified chronic obstructive pulmonary disease: Secondary | ICD-10-CM | POA: Insufficient documentation

## 2011-04-13 DIAGNOSIS — R112 Nausea with vomiting, unspecified: Secondary | ICD-10-CM | POA: Insufficient documentation

## 2011-04-13 DIAGNOSIS — K219 Gastro-esophageal reflux disease without esophagitis: Secondary | ICD-10-CM | POA: Insufficient documentation

## 2011-04-13 DIAGNOSIS — K2990 Gastroduodenitis, unspecified, without bleeding: Secondary | ICD-10-CM | POA: Insufficient documentation

## 2011-04-13 DIAGNOSIS — Z79899 Other long term (current) drug therapy: Secondary | ICD-10-CM | POA: Insufficient documentation

## 2011-04-13 DIAGNOSIS — J449 Chronic obstructive pulmonary disease, unspecified: Secondary | ICD-10-CM | POA: Insufficient documentation

## 2011-04-13 LAB — COMPREHENSIVE METABOLIC PANEL
ALT: 20 U/L (ref 0–53)
AST: 15 U/L (ref 0–37)
Albumin: 4.2 g/dL (ref 3.5–5.2)
Alkaline Phosphatase: 86 U/L (ref 39–117)
BUN: 37 mg/dL — ABNORMAL HIGH (ref 6–23)
CO2: 22 mEq/L (ref 19–32)
Calcium: 10 mg/dL (ref 8.4–10.5)
Chloride: 97 mEq/L (ref 96–112)
Creatinine, Ser: 1.01 mg/dL (ref 0.50–1.35)
GFR calc Af Amer: >60 mL/min (ref >60)
GFR calc non Af Amer: >60 mL/min (ref >60)
Glucose, Bld: 94 mg/dL (ref 70–99)
Potassium: 3.7 mEq/L (ref 3.5–5.1)
Sodium: 133 mEq/L — ABNORMAL LOW (ref 135–145)
Total Bilirubin: 0.5 mg/dL (ref 0.3–1.2)
Total Protein: 8.1 g/dL (ref 6.0–8.3)

## 2011-04-13 LAB — CBC
HCT: 45.2 % (ref 39.0–52.0)
Hemoglobin: 15.9 g/dL (ref 13.0–17.0)
MCH: 30.9 pg (ref 26.0–34.0)
MCHC: 35.2 g/dL (ref 30.0–36.0)
MCV: 87.9 fL (ref 78.0–100.0)
Platelets: 245 10*3/uL (ref 150–400)
RBC: 5.14 MIL/uL (ref 4.22–5.81)
RDW: 13.6 % (ref 11.5–15.5)
WBC: 8.9 10*3/uL (ref 4.0–10.5)

## 2011-04-13 LAB — DIFFERENTIAL
Basophils Absolute: 0 10*3/uL (ref 0.0–0.1)
Basophils Relative: 0 % (ref 0–1)
Eosinophils Absolute: 0 10*3/uL (ref 0.0–0.7)
Eosinophils Relative: 0 % (ref 0–5)
Lymphocytes Relative: 17 % (ref 12–46)
Lymphs Abs: 1.5 10*3/uL (ref 0.7–4.0)
Monocytes Absolute: 0.5 10*3/uL (ref 0.1–1.0)
Monocytes Relative: 5 % (ref 3–12)
Neutro Abs: 6.9 10*3/uL (ref 1.7–7.7)
Neutrophils Relative %: 78 % — ABNORMAL HIGH (ref 43–77)

## 2011-04-13 LAB — LIPASE, BLOOD: Lipase: 19 U/L (ref 11–59)

## 2011-04-18 ENCOUNTER — Emergency Department (HOSPITAL_COMMUNITY): Payer: Self-pay

## 2011-04-18 ENCOUNTER — Emergency Department (HOSPITAL_COMMUNITY)
Admission: EM | Admit: 2011-04-18 | Discharge: 2011-04-18 | Disposition: A | Discharge status: Home / Self Care | Payer: Self-pay | Attending: Emergency Medicine | Admitting: Emergency Medicine

## 2011-04-18 DIAGNOSIS — J4489 Other specified chronic obstructive pulmonary disease: Secondary | ICD-10-CM | POA: Insufficient documentation

## 2011-04-18 DIAGNOSIS — J449 Chronic obstructive pulmonary disease, unspecified: Secondary | ICD-10-CM | POA: Insufficient documentation

## 2011-04-18 DIAGNOSIS — I10 Essential (primary) hypertension: Secondary | ICD-10-CM | POA: Insufficient documentation

## 2011-04-18 DIAGNOSIS — K2970 Gastritis, unspecified, without bleeding: Secondary | ICD-10-CM | POA: Insufficient documentation

## 2011-04-18 DIAGNOSIS — R1013 Epigastric pain: Secondary | ICD-10-CM | POA: Insufficient documentation

## 2011-04-18 DIAGNOSIS — K219 Gastro-esophageal reflux disease without esophagitis: Secondary | ICD-10-CM | POA: Insufficient documentation

## 2011-04-18 LAB — COMPREHENSIVE METABOLIC PANEL
ALT: 16 U/L (ref 0–53)
AST: 13 U/L (ref 0–37)
Albumin: 4.3 g/dL (ref 3.5–5.2)
Alkaline Phosphatase: 82 U/L (ref 39–117)
BUN: 33 mg/dL — ABNORMAL HIGH (ref 6–23)
CO2: 31 mEq/L (ref 19–32)
Calcium: 10 mg/dL (ref 8.4–10.5)
Chloride: 96 mEq/L (ref 96–112)
Creatinine, Ser: 1.11 mg/dL (ref 0.50–1.35)
GFR calc Af Amer: >60 mL/min (ref >60)
GFR calc non Af Amer: >60 mL/min (ref >60)
Glucose, Bld: 107 mg/dL — ABNORMAL HIGH (ref 70–99)
Potassium: 4.6 mEq/L (ref 3.5–5.1)
Sodium: 138 mEq/L (ref 135–145)
Total Bilirubin: 0.6 mg/dL (ref 0.3–1.2)
Total Protein: 7.9 g/dL (ref 6.0–8.3)

## 2011-04-18 LAB — URINALYSIS, ROUTINE W REFLEX MICROSCOPIC
Glucose, UA: NEGATIVE mg/dL
Ketones, ur: 15 mg/dL — AB
Leukocytes, UA: NEGATIVE
Nitrite: NEGATIVE
Protein, ur: 100 mg/dL — AB
Specific Gravity, Urine: 1.028 (ref 1.005–1.030)
Urobilinogen, UA: 0.2 mg/dL (ref 0.0–1.0)
pH: 6 (ref 5.0–8.0)

## 2011-04-18 LAB — CBC
HCT: 48.2 % (ref 39.0–52.0)
Hemoglobin: 17.5 g/dL — ABNORMAL HIGH (ref 13.0–17.0)
MCH: 31.8 pg (ref 26.0–34.0)
MCHC: 36.3 g/dL — ABNORMAL HIGH (ref 30.0–36.0)
MCV: 87.5 fL (ref 78.0–100.0)
Platelets: 245 10*3/uL (ref 150–400)
RBC: 5.51 MIL/uL (ref 4.22–5.81)
RDW: 13.4 % (ref 11.5–15.5)
WBC: 12.4 10*3/uL — ABNORMAL HIGH (ref 4.0–10.5)

## 2011-04-18 LAB — DIFFERENTIAL
Basophils Absolute: 0 10*3/uL (ref 0.0–0.1)
Basophils Relative: 0 % (ref 0–1)
Eosinophils Absolute: 0.1 10*3/uL (ref 0.0–0.7)
Eosinophils Relative: 1 % (ref 0–5)
Lymphocytes Relative: 20 % (ref 12–46)
Lymphs Abs: 2.5 10*3/uL (ref 0.7–4.0)
Monocytes Absolute: 0.7 10*3/uL (ref 0.1–1.0)
Monocytes Relative: 6 % (ref 3–12)
Neutro Abs: 9.1 10*3/uL — ABNORMAL HIGH (ref 1.7–7.7)
Neutrophils Relative %: 73 % (ref 43–77)

## 2011-04-18 LAB — URINE MICROSCOPIC-ADD ON

## 2011-04-18 LAB — LIPASE, BLOOD: Lipase: 24 U/L (ref 11–59)

## 2011-04-20 ENCOUNTER — Emergency Department (HOSPITAL_COMMUNITY)
Admission: EM | Admit: 2011-04-20 | Discharge: 2011-04-20 | Discharge status: Against Medical Advice | Payer: Self-pay | Attending: Emergency Medicine | Admitting: Emergency Medicine

## 2011-04-20 DIAGNOSIS — R1110 Vomiting, unspecified: Secondary | ICD-10-CM | POA: Insufficient documentation

## 2012-01-25 DIAGNOSIS — R072 Precordial pain: Secondary | ICD-10-CM

## 2012-02-05 ENCOUNTER — Emergency Department (HOSPITAL_COMMUNITY): Payer: PRIVATE HEALTH INSURANCE

## 2012-02-05 ENCOUNTER — Emergency Department (HOSPITAL_COMMUNITY)
Admission: EM | Admit: 2012-02-05 | Discharge: 2012-02-05 | Disposition: A | Discharge status: Home / Self Care | Payer: PRIVATE HEALTH INSURANCE | Attending: Emergency Medicine | Admitting: Emergency Medicine

## 2012-02-05 ENCOUNTER — Encounter (HOSPITAL_COMMUNITY): Payer: Self-pay | Admitting: *Deleted

## 2012-02-05 DIAGNOSIS — F19939 Other psychoactive substance use, unspecified with withdrawal, unspecified: Secondary | ICD-10-CM | POA: Insufficient documentation

## 2012-02-05 DIAGNOSIS — K219 Gastro-esophageal reflux disease without esophagitis: Secondary | ICD-10-CM | POA: Insufficient documentation

## 2012-02-05 DIAGNOSIS — F19239 Other psychoactive substance dependence with withdrawal, unspecified: Secondary | ICD-10-CM | POA: Insufficient documentation

## 2012-02-05 DIAGNOSIS — F17203 Nicotine dependence unspecified, with withdrawal: Secondary | ICD-10-CM

## 2012-02-05 DIAGNOSIS — R112 Nausea with vomiting, unspecified: Secondary | ICD-10-CM | POA: Insufficient documentation

## 2012-02-05 DIAGNOSIS — R51 Headache: Secondary | ICD-10-CM

## 2012-02-05 DIAGNOSIS — F17200 Nicotine dependence, unspecified, uncomplicated: Secondary | ICD-10-CM | POA: Insufficient documentation

## 2012-02-05 DIAGNOSIS — R42 Dizziness and giddiness: Secondary | ICD-10-CM | POA: Insufficient documentation

## 2012-02-05 HISTORY — DX: Gastro-esophageal reflux disease without esophagitis: K21.9

## 2012-02-05 LAB — CBC
HCT: 43.6 % (ref 39.0–52.0)
Hemoglobin: 15.6 g/dL (ref 13.0–17.0)
MCH: 31.4 pg (ref 26.0–34.0)
MCHC: 35.8 g/dL (ref 30.0–36.0)
MCV: 87.7 fL (ref 78.0–100.0)
Platelets: 287 10*3/uL (ref 150–400)
RBC: 4.97 MIL/uL (ref 4.22–5.81)
RDW: 12.9 % (ref 11.5–15.5)
WBC: 9.9 10*3/uL (ref 4.0–10.5)

## 2012-02-05 LAB — BASIC METABOLIC PANEL
BUN: 25 mg/dL — ABNORMAL HIGH (ref 6–23)
CO2: 27 mEq/L (ref 19–32)
Calcium: 9.7 mg/dL (ref 8.4–10.5)
Chloride: 95 mEq/L — ABNORMAL LOW (ref 96–112)
Creatinine, Ser: 0.84 mg/dL (ref 0.50–1.35)
GFR calc Af Amer: >90 mL/min (ref >90)
GFR calc non Af Amer: >90 mL/min (ref >90)
Glucose, Bld: 124 mg/dL — ABNORMAL HIGH (ref 70–99)
Potassium: 3.7 mEq/L (ref 3.5–5.1)
Sodium: 132 mEq/L — ABNORMAL LOW (ref 135–145)

## 2012-02-05 LAB — DIFFERENTIAL
Basophils Absolute: 0 10*3/uL (ref 0.0–0.1)
Basophils Relative: 0 % (ref 0–1)
Eosinophils Absolute: 0.1 10*3/uL (ref 0.0–0.7)
Eosinophils Relative: 1 % (ref 0–5)
Lymphocytes Relative: 21 % (ref 12–46)
Lymphs Abs: 2.1 10*3/uL (ref 0.7–4.0)
Monocytes Absolute: 0.7 10*3/uL (ref 0.1–1.0)
Monocytes Relative: 7 % (ref 3–12)
Neutro Abs: 7 10*3/uL (ref 1.7–7.7)
Neutrophils Relative %: 71 % (ref 43–77)

## 2012-02-05 MED ORDER — METOCLOPRAMIDE HCL 5 MG/ML IJ SOLN
10.0000 mg | Freq: Once | INTRAMUSCULAR | Status: AC
  Administered 2012-02-05: 10 mg via INTRAVENOUS
  Filled 2012-02-05: qty 2

## 2012-02-05 MED ORDER — CYCLOBENZAPRINE HCL 5 MG PO TABS: 5.0000 mg | ORAL_TABLET | Freq: Three times a day (TID) | ORAL | Status: AC | PRN

## 2012-02-05 MED ORDER — PROMETHAZINE HCL 25 MG RE SUPP: RECTAL | Status: DC

## 2012-02-05 MED ORDER — SODIUM CHLORIDE 0.9 % IV BOLUS (SEPSIS)
1000.0000 mL | Freq: Once | INTRAVENOUS | Status: AC
  Administered 2012-02-05: 1000 mL via INTRAVENOUS

## 2012-02-05 MED ORDER — SODIUM CHLORIDE 0.9 % IV SOLN
INTRAVENOUS | Status: DC
  Administered 2012-02-05: 15:00:00 via INTRAVENOUS

## 2012-02-05 MED ORDER — DIPHENHYDRAMINE HCL 50 MG/ML IJ SOLN
25.0000 mg | Freq: Once | INTRAMUSCULAR | Status: AC
  Administered 2012-02-05: 25 mg via INTRAVENOUS
  Filled 2012-02-05: qty 1

## 2012-02-05 MED ORDER — KETOROLAC TROMETHAMINE 30 MG/ML IJ SOLN
30.0000 mg | Freq: Once | INTRAMUSCULAR | Status: AC
  Administered 2012-02-05: 30 mg via INTRAVENOUS
  Filled 2012-02-05: qty 1

## 2012-02-05 MED ORDER — IBUPROFEN 600 MG PO TABS: 600.0000 mg | ORAL_TABLET | Freq: Four times a day (QID) | ORAL | Status: AC | PRN

## 2012-02-05 NOTE — ED Notes (Addendum)
Pt c/o dizziness since last Saturday. Pt states that he feels like the room is spinning and it makes him nauseous. Able to smile, stick out tongue and squint eyes with symmetry. Able to raise arms and legs up and hold them with no drift noted. Also c/o headache radiating down into neck since dizziness started.

## 2012-02-05 NOTE — Discharge Instructions (Signed)
Look at the nicotine withdrawal symptoms sheet I gave you. Drink plenty of fluids. Take the medications as needed. CONTINUE THE GOOD WORK STOPPING SMOKING! Recheck if you feel worse.

## 2012-02-05 NOTE — ED Provider Notes (Signed)
History     CSN: [id number]  Arrival date & time 02/05/12  1234   First MD Initiated Contact with Patient 02/05/12 1259      Chief Complaint  Patient presents with  . Dizziness    (Consider location/radiation/quality/duration/timing/severity/associated sxs/prior treatment) HPI  Patient relates 2 weeks ago he was seen at [facility] ED for chest pain and was admitted to the hospital overnight. He had a stress test in the morning that was normal and he was discharged. He relates 8 days ago he was having nausea and vomiting without diarrhea or abdominal pain and begin return to [facility] ED and was admitted and discharged 6 days ago. He had a CT of his abdomen that was normal. He relates he felt well until days ago when he woke up with a headache. He states the headache starts in the back of his head and then shoots into the back of his eyes. He states he feels like he has pressure behind his eyes. He states he feels dizzy like he is going to pass out and denies spinning or vertigo symptoms. He relates that headache feels worse if he stands and in feels better if he lays flat. He states sometimes when the headache gets very intense he also gets a epigastric/periumbilical pain that is sharp it feels better when he lays flat. He states the headache is a throbbing pain and he denies visual changes. He states he had vomiting last night from the headache. He denies fever. He states he's never had headaches before. He denies any change in activity or injury. However during the course of our discussion I found out he quit smoking 3 packs a day approximately a week before all these symptoms started.  PCP none  Past Medical History  Diagnosis Date  . Acid reflux     Past Surgical History  Procedure Date  . Cholecystectomy     History reviewed. No pertinent family history. Coronary artery disease, diabetes, cancer No history of migraines  History  Substance Use Topics  . Smoking status: Former  Smoker quit 3 weeks ago was smoking 3ppd    Quit date: 01/15/2012  . Smokeless tobacco: Not on file  . Alcohol Use: No  lives at home a Lives with spouse Employed in saw mill    Review of Systems  All other systems reviewed and are negative.    Allergies  Penicillins  Home Medications   Current Outpatient Rx   prilosec OTC   BP 118/82  Pulse 102  Temp(Src) 97.5 F (36.4 C) (Oral)  Resp 24  Ht 5\' 9"  (1.753 m)  Wt 171 lb (77.565 kg)  BMI 25.25 kg/m2  SpO2 100%  Vital signs normal except tachycardia   Physical Exam  Nursing note and vitals reviewed. Constitutional: He is oriented to person, place, and time. He appears well-developed and well-nourished.  Non-toxic appearance. He does not appear ill. No distress.  HENT:  Head: Normocephalic and atraumatic.  Right Ear: External ear normal.  Left Ear: External ear normal.  Nose: Nose normal. No mucosal edema or rhinorrhea.  Mouth/Throat: Mucous membranes are normal. No dental abscesses or uvula swelling.       Tongue is dry  Eyes: Conjunctivae and EOM are normal. Pupils are equal, round, and reactive to light.  Neck: Normal range of motion and full passive range of motion without pain. Neck supple.       Patient states turning his head from left to right makes him feel like he  needs to "pop" his neck. He states that actually flexion of his neck forward makes his neck feel better. Therefore no nuchal rigidity  Cardiovascular: Normal rate, regular rhythm and normal heart sounds.  Exam reveals no gallop and no friction rub.   No murmur heard. Pulmonary/Chest: Effort normal and breath sounds normal. No respiratory distress. He has no wheezes. He has no rhonchi. He has no rales. He exhibits no tenderness and no crepitus.  Abdominal: Soft. Normal appearance and bowel sounds are normal. He exhibits no distension. There is no tenderness. There is no rebound and no guarding.  Musculoskeletal: Normal range of motion. He exhibits  no edema and no tenderness.       Moves all extremities well.   Neurological: He is alert and oriented to person, place, and time. He has normal strength. No cranial nerve deficit.  Skin: Skin is warm, dry and intact. No rash noted. No erythema. No pallor.  Psychiatric: His speech is normal and behavior is normal. His mood appears not anxious.       Affect is flat    ED Course  Procedures (including critical care time)   Medications  0.9 %  sodium chloride infusion (  Intravenous New Bag/Given 02/05/12 1448)  ibuprofen (ADVIL,MOTRIN) 600 MG tablet (not administered)  cyclobenzaprine (FLEXERIL) 5 MG tablet (not administered)  promethazine (PHENERGAN) 25 MG suppository (not administered)  sodium chloride 0.9 % bolus 1,000 mL (1000 mL Intravenous Given 02/05/12 1343)  metoCLOPramide (REGLAN) injection 10 mg (10 mg Intravenous Given 02/05/12 1342)  diphenhydrAMINE (BENADRYL) injection 25 mg (25 mg Intravenous Given 02/05/12 1342)  ketorolac (TORADOL) 30 MG/ML injection 30 mg (30 mg Intravenous Given 02/05/12 1525)    Pt is feeling better. His symptoms are most likely nicotene withdrawal symptoms. Pt given information sheet off the internet from [email or web address].  Results for orders placed during the hospital encounter of 02/05/12  BASIC METABOLIC PANEL      Component Value Range   Sodium 132 (*) 135 - 145 (mEq/L)   Potassium 3.7  3.5 - 5.1 (mEq/L)   Chloride 95 (*) 96 - 112 (mEq/L)   CO2 27  19 - 32 (mEq/L)   Glucose, Bld 124 (*) 70 - 99 (mg/dL)   BUN 25 (*) 6 - 23 (mg/dL)   Creatinine, Ser 1.61  0.50 - 1.35 (mg/dL)   Calcium 9.7  8.4 - 09.6 (mg/dL)   GFR calc non Af Amer >90  >90 (mL/min)   GFR calc Af Amer >90  >90 (mL/min)  CBC      Component Value Range   WBC 9.9  4.0 - 10.5 (K/uL)   RBC 4.97  4.22 - 5.81 (MIL/uL)   Hemoglobin 15.6  13.0 - 17.0 (g/dL)   HCT 04.5  40.9 - 81.1 (%)   MCV 87.7  78.0 - 100.0 (fL)   MCH 31.4  26.0 - 34.0 (pg)   MCHC 35.8  30.0 - 36.0 (g/dL)    RDW 91.4  78.2 - 95.6 (%)   Platelets 287  150 - 400 (K/uL)  DIFFERENTIAL      Component Value Range   Neutrophils Relative 71  43 - 77 (%)   Neutro Abs 7.0  1.7 - 7.7 (K/uL)   Lymphocytes Relative 21  12 - 46 (%)   Lymphs Abs 2.1  0.7 - 4.0 (K/uL)   Monocytes Relative 7  3 - 12 (%)   Monocytes Absolute 0.7  0.1 - 1.0 (K/uL)   Eosinophils Relative 1  0 -  5 (%)   Eosinophils Absolute 0.1  0.0 - 0.7 (K/uL)   Basophils Relative 0  0 - 1 (%)   Basophils Absolute 0.0  0.0 - 0.1 (K/uL)   Laboratory interpretation all normal except concentrated hemoglobin and BUN consistent with dehydration    Dg Cervical Spine Complete  02/05/2012  *RADIOLOGY REPORT*  Clinical Data: Neck pain arm numbness  CERVICAL SPINE - COMPLETE 4+ VIEW  Comparison: None.  Findings: No prevertebral soft tissue swelling.  Normal alignment of the cervical vertebral bodies.  Normal spinal laminar line. Neural foramen are patent.  Open mouth odontoid view is normal.  IMPRESSION: No acute findings of cervical spine.  Original Report Authenticated By: Genevive Bi, M.D.   Ct Head Wo Contrast  02/05/2012  *RADIOLOGY REPORT*  Clinical Data: 51 year old male with headache and dizziness.  CT HEAD WITHOUT CONTRAST  Technique:  Contiguous axial images were obtained from the base of the skull through the vertex without contrast.  Comparison: None  Findings: No acute intracranial abnormalities are identified, including mass lesion or mass effect, hydrocephalus, extra-axial fluid collection, midline shift, hemorrhage, or acute infarction.  The visualized bony calvarium is unremarkable. The visualized upper right maxillary sinus is opacified.  IMPRESSION: No evidence of intracranial abnormality.  Opacified upper right maxillary sinus - question mucous retention cyst/polyp or sinusitis.  Original Report Authenticated By: Rosendo Gros, M.D.     1. Headache   2. Nicotine withdrawal     New Prescriptions   CYCLOBENZAPRINE (FLEXERIL) 5  MG TABLET    Take 1 tablet (5 mg total) by mouth 3 (three) times daily as needed for muscle spasms.   IBUPROFEN (ADVIL,MOTRIN) 600 MG TABLET    Take 1 tablet (600 mg total) by mouth every 6 (six) hours as needed for pain.   PROMETHAZINE (PHENERGAN) 25 MG SUPPOSITORY    Unwrap and insert 1 PR PRN nausea, vomiting or headache   Plan discharge Devoria Albe, MD, FACEP   MDM          Ward Givens, MD 02/05/12 1550

## 2012-02-05 NOTE — ED Notes (Signed)
Pt a/ox4. Resp even and unlabored. NAD at this time. Pt presents with dizziness since last Saturday. Pt states he also is feeling nauseated. Pt states the dizziness makes him feel like the room is spinning. Pt has no neurological abnormalities noted upon assessment. IV established, blood drawn and sent to lab, and IVF at TKO rate. Pt hooked up to cardiac monitor. Pt lying supine in stretcher. Stretcher in low locked position. Side rail up for pt safety. Call light within reach. Education on plan of care provided. Pt verbalized understanding. Family member at bedside with pt. Waiting MD evaluation and orders. Will continue to monitor.

## 2012-10-20 ENCOUNTER — Emergency Department (HOSPITAL_COMMUNITY): Payer: Self-pay

## 2012-10-20 ENCOUNTER — Encounter (HOSPITAL_COMMUNITY): Payer: Self-pay | Admitting: *Deleted

## 2012-10-20 ENCOUNTER — Emergency Department (HOSPITAL_COMMUNITY)
Admission: EM | Admit: 2012-10-20 | Discharge: 2012-10-20 | Disposition: A | Discharge status: Home / Self Care | Payer: Self-pay | Attending: Emergency Medicine | Admitting: Emergency Medicine

## 2012-10-20 DIAGNOSIS — IMO0002 Reserved for concepts with insufficient information to code with codable children: Secondary | ICD-10-CM | POA: Insufficient documentation

## 2012-10-20 DIAGNOSIS — S39012A Strain of muscle, fascia and tendon of lower back, initial encounter: Secondary | ICD-10-CM

## 2012-10-20 DIAGNOSIS — X503XXA Overexertion from repetitive movements, initial encounter: Secondary | ICD-10-CM | POA: Insufficient documentation

## 2012-10-20 DIAGNOSIS — Y92009 Unspecified place in unspecified non-institutional (private) residence as the place of occurrence of the external cause: Secondary | ICD-10-CM | POA: Insufficient documentation

## 2012-10-20 DIAGNOSIS — X500XXA Overexertion from strenuous movement or load, initial encounter: Secondary | ICD-10-CM | POA: Insufficient documentation

## 2012-10-20 DIAGNOSIS — Y9389 Activity, other specified: Secondary | ICD-10-CM | POA: Insufficient documentation

## 2012-10-20 DIAGNOSIS — F17200 Nicotine dependence, unspecified, uncomplicated: Secondary | ICD-10-CM | POA: Insufficient documentation

## 2012-10-20 DIAGNOSIS — K219 Gastro-esophageal reflux disease without esophagitis: Secondary | ICD-10-CM | POA: Insufficient documentation

## 2012-10-20 DIAGNOSIS — Z79899 Other long term (current) drug therapy: Secondary | ICD-10-CM | POA: Insufficient documentation

## 2012-10-20 MED ORDER — METHOCARBAMOL 500 MG PO TABS
1000.0000 mg | ORAL_TABLET | Freq: Once | ORAL | Status: AC
  Administered 2012-10-20: 1000 mg via ORAL
  Filled 2012-10-20: qty 2

## 2012-10-20 MED ORDER — HYDROCODONE-ACETAMINOPHEN 5-325 MG PO TABS: 1.0000 | ORAL_TABLET | ORAL | Status: AC | PRN

## 2012-10-20 MED ORDER — HYDROCODONE-ACETAMINOPHEN 5-325 MG PO TABS
1.0000 | ORAL_TABLET | Freq: Once | ORAL | Status: AC
  Administered 2012-10-20: 1 via ORAL
  Filled 2012-10-20: qty 1

## 2012-10-20 MED ORDER — IBUPROFEN 600 MG PO TABS: 600.0000 mg | ORAL_TABLET | Freq: Four times a day (QID) | ORAL | Status: DC | PRN

## 2012-10-20 MED ORDER — METHOCARBAMOL 500 MG PO TABS: 1000.0000 mg | ORAL_TABLET | Freq: Four times a day (QID) | ORAL | Status: AC

## 2012-10-20 NOTE — ED Notes (Signed)
Low back pain, pt bent over to pick up a piece of wood and  Felt a"pop". Pain since then.

## 2012-10-20 NOTE — ED Notes (Signed)
Pt states lower back pain after cutting wood yesterday. Denies previous hx of back problems.

## 2012-10-22 NOTE — ED Provider Notes (Signed)
History     CSN: [id number]  Arrival date & time 10/20/12  1158   First MD Initiated Contact with Patient 10/20/12 1438      Chief Complaint  Patient presents with  . Back Pain    (Consider location/radiation/quality/duration/timing/severity/associated sxs/prior treatment) HPI Comments: [name] presents with new onset low back pain without radiation after working at home yesterday lifting and chopping wood.  Pain is worse with movement and with attempts to sit straight up, and worse when getting up after sitting for a while.  He denies numbness,  Weakness, radiation of pain and has had no urinary or bowel retention or incontinence.  He has taken tylenol without relief of symptoms.    The history is provided by the patient and the spouse.    Past Medical History  Diagnosis Date  . Acid reflux     Past Surgical History  Procedure Date  . Cholecystectomy     No family history on file.  History  Substance Use Topics  . Smoking status: Current Every Day Smoker    Last Attempt to Quit: 01/15/2012  . Smokeless tobacco: Not on file  . Alcohol Use: No      Review of Systems  Constitutional: Negative for fever.  Respiratory: Negative for shortness of breath.   Cardiovascular: Negative for chest pain and leg swelling.  Gastrointestinal: Negative for abdominal pain, constipation and abdominal distention.  Genitourinary: Negative for dysuria, urgency, frequency, flank pain and difficulty urinating.  Musculoskeletal: Positive for back pain. Negative for joint swelling and gait problem.  Skin: Negative for rash.  Neurological: Negative for weakness and numbness.    Allergies  Penicillins  Home Medications   Current Outpatient Rx  Name  Route  Sig  Dispense  Refill  . OMEPRAZOLE 20 MG PO CPDR   Oral   Take 20 mg by mouth daily.         Marland Kitchen HYDROCODONE-ACETAMINOPHEN 5-325 MG PO TABS   Oral   Take 1 tablet by mouth every 4 (four) hours as needed for pain.   15  tablet   0   . IBUPROFEN 600 MG PO TABS   Oral   Take 1 tablet (600 mg total) by mouth every 6 (six) hours as needed for pain.   30 tablet   0   . METHOCARBAMOL 500 MG PO TABS   Oral   Take 2 tablets (1,000 mg total) by mouth 4 (four) times daily.   40 tablet   0     BP 142/83  Pulse 75  Temp 98.1 F (36.7 C) (Oral)  Resp 16  Ht 5\' 9"  (1.753 m)  Wt 145 lb (65.772 kg)  BMI 21.41 kg/m2  SpO2 100%  Physical Exam  Nursing note and vitals reviewed. Constitutional: He appears well-developed and well-nourished.  HENT:  Head: Normocephalic.  Eyes: Conjunctivae normal are normal.  Neck: Normal range of motion. Neck supple.  Cardiovascular: Normal rate and intact distal pulses.        Pedal pulses normal.  Pulmonary/Chest: Effort normal.  Abdominal: Soft. Bowel sounds are normal. He exhibits no distension and no mass.  Musculoskeletal: Normal range of motion. He exhibits no edema.       Lumbar back: He exhibits tenderness and bony tenderness. He exhibits no swelling, no edema and no spasm.       Lumbar midline and paralumbar ttp.    Neurological: He is alert. He has normal strength. He displays no atrophy and no tremor.  No sensory deficit. Gait normal.  Reflex Scores:      Patellar reflexes are 2+ on the right side and 2+ on the left side.      Achilles reflexes are 2+ on the right side and 2+ on the left side.      No strength deficit noted in hip and knee flexor and extensor muscle groups.  Ankle flexion and extension intact.  Skin: Skin is warm and dry.  Psychiatric: He has a normal mood and affect.    ED Course  Procedures (including critical care time)  Labs Reviewed - No data to display Dg Lumbar Spine Complete  10/20/2012  *RADIOLOGY REPORT*  Clinical Data: Low back pain.  Lifting injury.  LUMBAR SPINE - COMPLETE 4+ VIEW  Comparison: 01/28/2012  Findings: Prior cholecystectomy.  Normal alignment.  No fracture. SI joints are symmetric and unremarkable.  Partial  sacralization of the L5 vertebral body.  IMPRESSION: No acute findings.   Original Report Authenticated By: Charlett Nose, M.D.      1. Lumbar strain       MDM  No neuro deficit on exam or by history to suggest emergent or surgical presentation.  Also discussed worsened sx that should prompt immediate re-evaluation including distal weakness, bowel/bladder retention/incontinence. Pt was prescribed hydrocodone, robaxin and ibuprofen - cautioned to dc ibuprofen if reflux worsens,  Continue taking prilosec.  F/u with pcp if not improved over next 5 days.  Patients labs and/or radiological studies were reviewed during the medical decision making and disposition process.               Burgess Amor, [place] 10/22/12 1116

## 2012-10-22 NOTE — ED Provider Notes (Signed)
Medical screening examination/treatment/procedure(s) were performed by non-physician practitioner and as supervising physician I was immediately available for consultation/collaboration.  Geoffery Lyons, MD 10/22/12 [phone]

## 2013-07-15 ENCOUNTER — Emergency Department (HOSPITAL_COMMUNITY): Payer: BC Managed Care – PPO

## 2013-07-15 ENCOUNTER — Encounter (HOSPITAL_COMMUNITY): Payer: Self-pay | Admitting: *Deleted

## 2013-07-15 ENCOUNTER — Emergency Department (HOSPITAL_COMMUNITY)
Admission: EM | Admit: 2013-07-15 | Discharge: 2013-07-15 | Disposition: A | Discharge status: Home / Self Care | Payer: BC Managed Care – PPO | Attending: Emergency Medicine | Admitting: Emergency Medicine

## 2013-07-15 DIAGNOSIS — R000 Tachycardia, unspecified: Secondary | ICD-10-CM | POA: Insufficient documentation

## 2013-07-15 DIAGNOSIS — H5789 Other specified disorders of eye and adnexa: Secondary | ICD-10-CM | POA: Insufficient documentation

## 2013-07-15 DIAGNOSIS — J3489 Other specified disorders of nose and nasal sinuses: Secondary | ICD-10-CM | POA: Insufficient documentation

## 2013-07-15 DIAGNOSIS — R509 Fever, unspecified: Secondary | ICD-10-CM | POA: Insufficient documentation

## 2013-07-15 DIAGNOSIS — H579 Unspecified disorder of eye and adnexa: Secondary | ICD-10-CM | POA: Insufficient documentation

## 2013-07-15 DIAGNOSIS — Z79899 Other long term (current) drug therapy: Secondary | ICD-10-CM | POA: Insufficient documentation

## 2013-07-15 DIAGNOSIS — K219 Gastro-esophageal reflux disease without esophagitis: Secondary | ICD-10-CM | POA: Insufficient documentation

## 2013-07-15 DIAGNOSIS — J40 Bronchitis, not specified as acute or chronic: Secondary | ICD-10-CM

## 2013-07-15 DIAGNOSIS — R51 Headache: Secondary | ICD-10-CM | POA: Insufficient documentation

## 2013-07-15 DIAGNOSIS — IMO0001 Reserved for inherently not codable concepts without codable children: Secondary | ICD-10-CM | POA: Insufficient documentation

## 2013-07-15 DIAGNOSIS — Z880 Allergy status to penicillin: Secondary | ICD-10-CM | POA: Insufficient documentation

## 2013-07-15 DIAGNOSIS — F17200 Nicotine dependence, unspecified, uncomplicated: Secondary | ICD-10-CM | POA: Insufficient documentation

## 2013-07-15 DIAGNOSIS — J209 Acute bronchitis, unspecified: Secondary | ICD-10-CM | POA: Insufficient documentation

## 2013-07-15 MED ORDER — PREDNISONE 50 MG PO TABS
60.0000 mg | ORAL_TABLET | Freq: Once | ORAL | Status: AC
  Administered 2013-07-15: 60 mg via ORAL
  Filled 2013-07-15: qty 1

## 2013-07-15 MED ORDER — ALBUTEROL SULFATE HFA 108 (90 BASE) MCG/ACT IN AERS
2.0000 | INHALATION_SPRAY | RESPIRATORY_TRACT | Status: DC | PRN
  Administered 2013-07-15: 2 via RESPIRATORY_TRACT
  Filled 2013-07-15: qty 6.7

## 2013-07-15 MED ORDER — PREDNISONE 10 MG PO TABS: ORAL_TABLET | ORAL | Status: DC

## 2013-07-15 MED ORDER — PROMETHAZINE-CODEINE 6.25-10 MG/5ML PO SYRP: 5.0000 mL | ORAL_SOLUTION | ORAL | Status: DC | PRN

## 2013-07-15 MED ORDER — AZITHROMYCIN 250 MG PO TABS: ORAL_TABLET | ORAL | Status: DC

## 2013-07-15 MED ORDER — HYDROCODONE-ACETAMINOPHEN 5-325 MG PO TABS
1.0000 | ORAL_TABLET | Freq: Once | ORAL | Status: AC
  Administered 2013-07-15: 1 via ORAL
  Filled 2013-07-15: qty 1

## 2013-07-15 MED ORDER — SODIUM CHLORIDE 0.9 % IN NEBU
INHALATION_SOLUTION | RESPIRATORY_TRACT | Status: AC
  Filled 2013-07-15: qty 3

## 2013-07-15 MED ORDER — ALBUTEROL SULFATE (5 MG/ML) 0.5% IN NEBU
2.5000 mg | INHALATION_SOLUTION | Freq: Once | RESPIRATORY_TRACT | Status: AC
  Administered 2013-07-15: 2.5 mg via RESPIRATORY_TRACT
  Filled 2013-07-15: qty 0.5

## 2013-07-15 MED ORDER — ALBUTEROL SULFATE (5 MG/ML) 0.5% IN NEBU
5.0000 mg | INHALATION_SOLUTION | Freq: Once | RESPIRATORY_TRACT | Status: AC
  Administered 2013-07-15: 5 mg via RESPIRATORY_TRACT
  Filled 2013-07-15: qty 1

## 2013-07-15 MED ORDER — IPRATROPIUM BROMIDE 0.02 % IN SOLN
0.5000 mg | Freq: Once | RESPIRATORY_TRACT | Status: AC
  Administered 2013-07-15: 0.5 mg via RESPIRATORY_TRACT
  Filled 2013-07-15: qty 2.5

## 2013-07-15 NOTE — ED Notes (Signed)
HHN in progress 

## 2013-07-15 NOTE — ED Provider Notes (Signed)
CSN: [id number]     Arrival date & time 07/15/13  1528 History   First MD Initiated Contact with Patient 07/15/13 1623     Chief Complaint  Patient presents with  . Cough   (Consider location/radiation/quality/duration/timing/severity/associated sxs/prior Treatment) Patient is a 52 y.o. male presenting with cough. The history is provided by the patient.  Cough Cough characteristics:  Productive Sputum characteristics:  White Severity:  Moderate Onset quality:  Gradual Duration:  3 days Timing:  Sporadic Chronicity:  New Smoker: yes   Relieved by:  Nothing Worsened by:  Activity and smoking Ineffective treatments:  Cough suppressants, decongestant and fluids Associated symptoms: chills, fever, headaches (from coughing so much), myalgias, shortness of breath and sinus congestion   Associated symptoms: no rash and no sore throat    [name] is a 52 y.o. male who presents to the ED with cough, congestion and fever x 3 days. He has tried over the counter medication without relief. He has had fever up to 100. He coughs until he vomits. Feels sore from coughing so much.   Past Medical History  Diagnosis Date  . Acid reflux    Past Surgical History  Procedure Laterality Date  . Cholecystectomy     No family history on file. History  Substance Use Topics  . Smoking status: Current Every Day Smoker    Last Attempt to Quit: 01/15/2012  . Smokeless tobacco: Not on file  . Alcohol Use: No    Review of Systems  Constitutional: Positive for fever and chills.  HENT: Positive for congestion. Negative for sore throat, trouble swallowing and neck pain.   Eyes: Positive for redness and itching. Negative for pain.  Respiratory: Positive for cough and shortness of breath.   Gastrointestinal: Negative for nausea. Vomiting: with cough.  Genitourinary: Negative for urgency and frequency.  Musculoskeletal: Positive for myalgias.  Skin: Negative for rash.  Neurological: Positive for  headaches (from coughing so much).  Psychiatric/Behavioral: The patient is not nervous/anxious.     Allergies  Penicillins  Home Medications   Current Outpatient Rx  Name  Route  Sig  Dispense  Refill  . ibuprofen (ADVIL,MOTRIN) 600 MG tablet   Oral   Take 1 tablet (600 mg total) by mouth every 6 (six) hours as needed for pain.   30 tablet   0   . omeprazole (PRILOSEC) 20 MG capsule   Oral   Take 20 mg by mouth daily.          BP 136/97  Pulse 131  Temp(Src) 100 F (37.8 C) (Oral)  Resp 20  Ht 5\' 9"  (1.753 m)  Wt 162 lb (73.483 kg)  BMI 23.91 kg/m2  SpO2 96% Physical Exam  Nursing note and vitals reviewed. Constitutional: He is oriented to person, place, and time. He appears well-developed and well-nourished. No distress.  HENT:  Head: Normocephalic and atraumatic.  Mouth/Throat: Uvula is midline, oropharynx is clear and moist and mucous membranes are normal.  Eyes: EOM are normal. Right conjunctiva is injected. Left conjunctiva is injected.  Neck: Normal range of motion. Neck supple.  Cardiovascular: Tachycardia present.   Pulmonary/Chest: He has decreased breath sounds. He has wheezes. He has rhonchi.  Abdominal: Soft. Bowel sounds are normal. There is no tenderness.  Musculoskeletal: Normal range of motion.  Lymphadenopathy:    He has no cervical adenopathy.  Neurological: He is alert and oriented to person, place, and time. No cranial nerve deficit.  Skin: Skin is warm and dry.  Psychiatric: He has a normal mood and affect. His behavior is normal.   Patient given Prednisone 60 mg PO and hydrocodone 5/325 mg PO  Albuterol and Atrovent neb treatment given and patient feeling some better. Re examined and lung sounds have improved but there continues to be wheezing and rhonchi bilateral.  Second neb treatment given and patient re examined. More improvement and lungs have wheezing but much less.   ED Course  ProceduresImaging Review Dg Chest 2 View  07/15/2013    CLINICAL DATA:  Cough, shortness of breath, fever, congestion, body aches  EXAM: CHEST  2 VIEW  COMPARISON:  03/15/2013  FINDINGS: Lungs are clear. No pleural effusion or pneumothorax.  The heart is normal in size.  Mild degenerative changes of the visualized thoracolumbar spine.  IMPRESSION: No evidence of acute cardiopulmonary disease.   Electronically Signed   By: Charline Bills M.D.   On: 07/15/2013 16:17    MDM  53 y.o. male with cough and congestion and fever x 3 days. Improved with treatments in the ED. Patient stable for discharge home without any immediate complications. Will treat with steroid taper, Z-pak and Phenergan Expectorant with Codeine. Patient also given Albuterol Inhaler with instructions prior to discharge.  I have reviewed this patient's vital signs, nurses notes, appropriate imaging and discussed findings and plan of care with the patient. He voices understanding.    Medication List    TAKE these medications       azithromycin 250 MG tablet  Commonly known as:  ZITHROMAX  Take first 2 tablets together, then 1 every day until finished.     predniSONE 10 MG tablet  Commonly known as:  DELTASONE  Starting 07/16/13 take 5 tablets PO then 4, 3, 2, 1     promethazine-codeine 6.25-10 MG/5ML syrup  Commonly known as:  PHENERGAN with CODEINE  Take 5 mLs by mouth every 4 (four) hours as needed for cough.      ASK your doctor about these medications       omeprazole 20 MG capsule  Commonly known as:  PRILOSEC  Take 20 mg by mouth daily.             [place], [place] 07/15/13 2024

## 2013-07-15 NOTE — ED Notes (Signed)
Repeat HHN

## 2013-07-15 NOTE — ED Notes (Addendum)
Pt c/o cough that is productive "very little" generalized body aches that started Thursday evening,

## 2013-07-15 NOTE — ED Notes (Signed)
Onset Thursday with cough,  Sinus congestion, vomits after coughing episodes , no nausea

## 2013-07-16 NOTE — ED Provider Notes (Signed)
Medical screening examination/treatment/procedure(s) were performed by non-physician practitioner and as supervising physician I was immediately available for consultation/collaboration.   Tiegan Jambor M Kiyla Ringler, MD 07/16/13 1704 

## 2014-02-15 ENCOUNTER — Ambulatory Visit (INDEPENDENT_AMBULATORY_CARE_PROVIDER_SITE_OTHER): Payer: 59 | Admitting: General Practice

## 2014-02-15 ENCOUNTER — Encounter: Payer: Self-pay | Admitting: General Practice

## 2014-02-15 ENCOUNTER — Encounter (INDEPENDENT_AMBULATORY_CARE_PROVIDER_SITE_OTHER): Payer: Self-pay

## 2014-02-15 VITALS — BP 147/90 | HR 81 | Temp 97.1°F | Ht 69.0 in | Wt 188.6 lb

## 2014-02-15 DIAGNOSIS — K219 Gastro-esophageal reflux disease without esophagitis: Secondary | ICD-10-CM

## 2014-02-15 DIAGNOSIS — I10 Essential (primary) hypertension: Secondary | ICD-10-CM

## 2014-02-15 MED ORDER — METOPROLOL TARTRATE 25 MG PO TABS: 25.0000 mg | ORAL_TABLET | Freq: Every day | ORAL | Status: DC

## 2014-02-15 MED ORDER — HYDROCHLOROTHIAZIDE 25 MG PO TABS: 12.5000 mg | ORAL_TABLET | Freq: Every day | ORAL | Status: DC

## 2014-02-15 MED ORDER — LISINOPRIL-HYDROCHLOROTHIAZIDE 20-12.5 MG PO TABS: 1.0000 | ORAL_TABLET | Freq: Every day | ORAL | Status: DC

## 2014-02-15 MED ORDER — PANTOPRAZOLE SODIUM 20 MG PO TBEC: 20.0000 mg | DELAYED_RELEASE_TABLET | Freq: Every day | ORAL | Status: DC

## 2014-02-15 NOTE — Patient Instructions (Addendum)
Hypertension As your heart beats, it forces blood through your arteries. This force is your blood pressure. If the pressure is too high, it is called hypertension (HTN) or high blood pressure. HTN is dangerous because you may have it and not know it. High blood pressure may mean that your heart has to work harder to pump blood. Your arteries may be narrow or stiff. The extra work puts you at risk for heart disease, stroke, and other problems.  Blood pressure consists of two numbers, a higher number over a lower, 110/72, for example. It is stated as "110 over 72." The ideal is below 120 for the top number (systolic) and under 80 for the bottom (diastolic). Write down your blood pressure today. You should pay close attention to your blood pressure if you have certain conditions such as:  Heart failure.  Prior heart attack.  Diabetes  Chronic kidney disease.  Prior stroke.  Multiple risk factors for heart disease. To see if you have HTN, your blood pressure should be measured while you are seated with your arm held at the level of the heart. It should be measured at least twice. A one-time elevated blood pressure reading (especially in the Emergency Department) does not mean that you need treatment. There may be conditions in which the blood pressure is different between your right and left arms. It is important to see your caregiver soon for a recheck. Most people have essential hypertension which means that there is not a specific cause. This type of high blood pressure may be lowered by changing lifestyle factors such as:  Stress.  Smoking.  Lack of exercise.  Excessive weight.  Drug/tobacco/alcohol use.  Eating less salt. Most people do not have symptoms from high blood pressure until it has caused damage to the body. Effective treatment can often prevent, delay or reduce that damage. TREATMENT  When a cause has been identified, treatment for high blood pressure is directed at the  cause. There are a large number of medications to treat HTN. These fall into several categories, and your caregiver will help you select the medicines that are best for you. Medications may have side effects. You should review side effects with your caregiver. If your blood pressure stays high after you have made lifestyle changes or started on medicines,   Your medication(s) may need to be changed.  Other problems may need to be addressed.  Be certain you understand your prescriptions, and know how and when to take your medicine.  Be sure to follow up with your caregiver within the time frame advised (usually within two weeks) to have your blood pressure rechecked and to review your medications.  If you are taking more than one medicine to lower your blood pressure, make sure you know how and at what times they should be taken. Taking two medicines at the same time can result in blood pressure that is too low. SEEK IMMEDIATE MEDICAL CARE IF:  You develop a severe headache, blurred or changing vision, or confusion.  You have unusual weakness or numbness, or a faint feeling.  You have severe chest or abdominal pain, vomiting, or breathing problems. MAKE SURE YOU:   Understand these instructions.  Will watch your condition.  Will get help right away if you are not doing well or get worse. Document Released: 10/11/2005 Document Revised: 01/03/2012 Document Reviewed: 05/31/2008 [facility] Patient Information 2014 [place], [place].  Diet for Gastroesophageal Reflux Disease, Adult Reflux (acid reflux) is when acid from your stomach  flows up into the esophagus. When acid comes in contact with the esophagus, the acid causes irritation and soreness (inflammation) in the esophagus. When reflux happens often or so severely that it causes damage to the esophagus, it is called gastroesophageal reflux disease (GERD). Nutrition therapy can help ease the discomfort of GERD. FOODS OR DRINKS TO AVOID OR  LIMIT  Smoking or chewing tobacco. Nicotine is one of the most potent stimulants to acid production in the gastrointestinal tract.  Caffeinated and decaffeinated coffee and black tea.  Regular or low-calorie carbonated beverages or energy drinks (caffeine-free carbonated beverages are allowed).   Strong spices, such as black pepper, white pepper, red pepper, cayenne, curry powder, and chili powder.  Peppermint or spearmint.  Chocolate.  High-fat foods, including meats and fried foods. Extra added fats including oils, butter, salad dressings, and nuts. Limit these to less than 8 tsp per day.  Fruits and vegetables if they are not tolerated, such as citrus fruits or tomatoes.  Alcohol.  Any food that seems to aggravate your condition. If you have questions regarding your diet, call your caregiver or a registered dietitian. OTHER THINGS THAT MAY HELP GERD INCLUDE:   Eating your meals slowly, in a relaxed setting.  Eating 5 to 6 small meals per day instead of 3 large meals.  Eliminating food for a period of time if it causes distress.  Not lying down until 3 hours after eating a meal.  Keeping the head of your bed raised 6 to 9 inches (15 to 23 cm) by using a foam wedge or blocks under the legs of the bed. Lying flat may make symptoms worse.  Being physically active. Weight loss may be helpful in reducing reflux in overweight or obese adults.  Wear loose fitting clothing EXAMPLE MEAL PLAN This meal plan is approximately 2,000 calories based on https://www.bernard.org/ChooseMyPlate.gov meal planning guidelines. Breakfast   cup cooked oatmeal.  1 cup strawberries.  1 cup low-fat milk.  1 oz almonds. Snack  1 cup cucumber slices.  6 oz yogurt (made from low-fat or fat-free milk). Lunch  2 slice whole-wheat bread.  2 oz sliced [place].  2 tsp mayonnaise.  1 cup blueberries.  1 cup snap peas. Snack  6 whole-wheat crackers.  1 oz string cheese. Dinner   cup brown rice.  1  cup mixed veggies.  1 tsp olive oil.  3 oz grilled fish. Document Released: 10/11/2005 Document Revised: 01/03/2012 Document Reviewed: 08/27/2011 [facility] Patient Information 2014 [place], [place].

## 2014-02-15 NOTE — Progress Notes (Signed)
   Subjective:    Patient ID: [name], male    DOB: 30-Sep-1961, 53 y.o.   MRN: [id number]  HPI Patient presents today to establish care. He reports being seen at [facility] in the emergency room in March, for headache and was diagnosed with hypertension. Started on metoprolol $RemoveBefor'25mg'akTOzbPFezGr$ , one tablet daily. Checked blood pressure after beginning medication and reading 134/87. Reports smoking cigarettes 2ppd. Reports eating healthy, but denies regular exercise. Reports a history of gerd, taking prilosec 20 mg daily and it is no longer effective. Hospital records indicate blood pressure ranged 150's/90-104 on 12/29/13. Also elevated heart rate 96-104.  Review of Systems  Constitutional: Negative for fever and chills.  Respiratory: Negative for chest tightness and shortness of breath.   Cardiovascular: Negative for chest pain and palpitations.  Gastrointestinal: Negative for nausea, vomiting, abdominal pain, diarrhea and blood in stool.  Genitourinary: Negative for difficulty urinating.  Neurological: Negative for dizziness, weakness and headaches.       Objective:   Physical Exam  Constitutional: He is oriented to person, place, and time. He appears well-developed and well-nourished.  HENT:  Head: Normocephalic and atraumatic.  Right Ear: External ear normal.  Left Ear: External ear normal.  Eyes: EOM are normal. Pupils are equal, round, and reactive to light.  Neck: Normal range of motion. Neck supple. No thyromegaly present.  Cardiovascular: Normal rate, regular rhythm and normal heart sounds.   Pulmonary/Chest: Effort normal and breath sounds normal. No respiratory distress. He exhibits no tenderness.  Abdominal: Soft. Bowel sounds are normal. He exhibits no distension. There is no tenderness.  Lymphadenopathy:    He has no cervical adenopathy.  Neurological: He is alert and oriented to person, place, and time.  Skin: Skin is warm and dry.  Psychiatric: He has a normal mood and  affect.          Assessment & Plan:  1. Hypertension - CMP14+EGFR - Lipid panel - hydrochlorothiazide (HYDRODIURIL) 25 MG tablet; Take 0.5 tablets (12.5 mg total) by mouth daily.  Dispense: 30 tablet; Refill: 1 - metoprolol tartrate (LOPRESSOR) 25 MG tablet; Take 1 tablet (25 mg total) by mouth daily.  Dispense: 30 tablet; Refill: 1  2. GERD (gastroesophageal reflux disease)  - pantoprazole (PROTONIX) 20 MG tablet; Take 1 tablet (20 mg total) by mouth daily.  Dispense: 30 tablet; Refill: 5 -discontinue prilosec Labs pending Maintain blood pressure diary and bring to next visit F/u in 2 weeks for blood pressure recheck Discussed benefits of regular exercise and healthy eating Patient verbalized understanding Erby Pian, FNP-C

## 2014-02-16 LAB — CMP14+EGFR
ALT: 18 IU/L (ref 0–44)
AST: 17 IU/L (ref 0–40)
Albumin/Globulin Ratio: 1.9 (ref 1.1–2.5)
Albumin: 4.3 g/dL (ref 3.5–5.5)
Alkaline Phosphatase: 73 IU/L (ref 39–117)
BUN/Creatinine Ratio: 15 (ref 9–20)
BUN: 14 mg/dL (ref 6–24)
CO2: 25 mmol/L (ref 18–29)
Calcium: 9.4 mg/dL (ref 8.7–10.2)
Chloride: 99 mmol/L (ref 97–108)
Creatinine, Ser: 0.95 mg/dL (ref 0.76–1.27)
GFR calc Af Amer: 107 mL/min/{1.73_m2} (ref >59)
GFR calc non Af Amer: 92 mL/min/{1.73_m2} (ref >59)
Globulin, Total: 2.3 g/dL (ref 1.5–4.5)
Glucose: 97 mg/dL (ref 65–99)
Potassium: 4.2 mmol/L (ref 3.5–5.2)
Sodium: 137 mmol/L (ref 134–144)
Total Bilirubin: 0.2 mg/dL (ref 0.0–1.2)
Total Protein: 6.6 g/dL (ref 6.0–8.5)

## 2014-02-16 LAB — LIPID PANEL
Chol/HDL Ratio: 8.1 ratio units — ABNORMAL HIGH (ref 0.0–5.0)
Cholesterol, Total: 244 mg/dL — ABNORMAL HIGH (ref 100–199)
HDL: 30 mg/dL — ABNORMAL LOW (ref >39)
LDL Calculated: 168 mg/dL — ABNORMAL HIGH (ref 0–99)
Triglycerides: 228 mg/dL — ABNORMAL HIGH (ref 0–149)
VLDL Cholesterol Cal: 46 mg/dL — ABNORMAL HIGH (ref 5–40)

## 2014-02-17 DIAGNOSIS — I10 Essential (primary) hypertension: Secondary | ICD-10-CM | POA: Insufficient documentation

## 2014-02-18 ENCOUNTER — Other Ambulatory Visit: Payer: Self-pay | Admitting: General Practice

## 2014-02-18 ENCOUNTER — Telehealth: Payer: Self-pay | Admitting: General Practice

## 2014-02-18 DIAGNOSIS — K219 Gastro-esophageal reflux disease without esophagitis: Secondary | ICD-10-CM

## 2014-02-18 MED ORDER — ESOMEPRAZOLE MAGNESIUM 20 MG PO CPDR: 20.0000 mg | DELAYED_RELEASE_CAPSULE | Freq: Every day | ORAL | Status: DC

## 2014-02-18 NOTE — Telephone Encounter (Signed)
Please inform nexium script sent to pharmacy. Also follow GERD diet. If symptoms persist RTO

## 2014-02-19 NOTE — Telephone Encounter (Signed)
Left detailed message on patients phone.

## 2014-02-22 ENCOUNTER — Telehealth: Payer: Self-pay | Admitting: *Deleted

## 2014-02-22 NOTE — Telephone Encounter (Signed)
Aware of lab results  

## 2014-02-22 NOTE — Telephone Encounter (Signed)
Message copied by Almeta MonasSTONE, Byanca Kasper M on Fri Feb 22, 2014  4:33 PM ------      Message from: Coralie KeensHALIBURTON, MAE E      Created: Fri Feb 22, 2014 10:28 AM       Please inform that cmp results normal. Cholesterol elevated, work on lifestyle modification (healthy eating-low fat, baked foods and some form of regular exercise). Will need labs rechecked in 3 months, if continues to rise or no improvement will need to start lipid lowering medication. ------

## 2014-02-28 ENCOUNTER — Ambulatory Visit: Payer: Self-pay | Admitting: Family Medicine

## 2014-02-28 ENCOUNTER — Telehealth: Payer: Self-pay | Admitting: Family Medicine

## 2014-02-28 NOTE — Telephone Encounter (Signed)
appt given for 5/13 with bill oxford

## 2014-03-06 ENCOUNTER — Ambulatory Visit (INDEPENDENT_AMBULATORY_CARE_PROVIDER_SITE_OTHER): Payer: 59 | Admitting: Family Medicine

## 2014-03-06 ENCOUNTER — Encounter: Payer: Self-pay | Admitting: Family Medicine

## 2014-03-06 VITALS — BP 136/78 | HR 84 | Temp 98.0°F | Ht 69.0 in | Wt 187.4 lb

## 2014-03-06 DIAGNOSIS — I10 Essential (primary) hypertension: Secondary | ICD-10-CM

## 2014-03-06 MED ORDER — METOPROLOL TARTRATE 25 MG PO TABS: 25.0000 mg | ORAL_TABLET | Freq: Every day | ORAL | Status: DC

## 2014-03-06 MED ORDER — OMEPRAZOLE 40 MG PO CPDR
40.0000 mg | DELAYED_RELEASE_CAPSULE | Freq: Every day | ORAL | Status: DC
Start: 2014-03-06 — End: 2017-06-26

## 2014-03-06 MED ORDER — HYDROCHLOROTHIAZIDE 25 MG PO TABS: 12.5000 mg | ORAL_TABLET | Freq: Every day | ORAL | Status: DC

## 2014-03-06 NOTE — Progress Notes (Signed)
   Subjective:    Patient ID: [name], male    DOB: 09/17/1961, 53 y.o.   MRN: [id number]  HPI  This 53 y.o. male presents for evaluation of hypertension.  He is having some muscle aches.  Review of Systems No chest pain, SOB, HA, dizziness, vision change, N/V, diarrhea, constipation, dysuria, urinary urgency or frequency, myalgias, arthralgias or rash.     Objective:   Physical Exam Vital signs noted  Well developed well nourished male.  HEENT - Head atraumatic Normocephalic                Eyes - PERRLA, Conjuctiva - clear Sclera- Clear EOMI                Ears - EAC's Wnl TM's Wnl Gross Hearing WNL                Nose - Nares patent                 Throat - oropharanx wnl Respiratory - Lungs CTA bilateral Cardiac - RRR S1 and S2 without murmur GI - Abdomen soft Nontender and bowel sounds active x 4 Extremities - No edema. Neuro - Grossly intact.       Assessment & Plan:  Hypertension - Plan: omeprazole (PRILOSEC) 40 MG capsule, metoprolol tartrate (LOPRESSOR) 25 MG tablet, hydrochlorothiazide (HYDRODIURIL) 25 MG tablet, BMP8+EGFR  Follow up in 3 months  Barry Penner FNP

## 2014-03-07 LAB — BMP8+EGFR
BUN/Creatinine Ratio: 20 (ref 9–20)
BUN: 19 mg/dL (ref 6–24)
CO2: 19 mmol/L (ref 18–29)
Calcium: 9.1 mg/dL (ref 8.7–10.2)
Chloride: 103 mmol/L (ref 97–108)
Creatinine, Ser: 0.96 mg/dL (ref 0.76–1.27)
GFR calc Af Amer: 105 mL/min/{1.73_m2} (ref >59)
GFR calc non Af Amer: 91 mL/min/{1.73_m2} (ref >59)
Glucose: 78 mg/dL (ref 65–99)
Potassium: 4.3 mmol/L (ref 3.5–5.2)
Sodium: 142 mmol/L (ref 134–144)

## 2014-06-25 ENCOUNTER — Ambulatory Visit: Payer: 59 | Admitting: Family Medicine

## 2014-08-16 ENCOUNTER — Emergency Department (HOSPITAL_COMMUNITY)
Admission: EM | Admit: 2014-08-16 | Discharge: 2014-08-16 | Disposition: A | Discharge status: Home / Self Care | Payer: Self-pay | Attending: Emergency Medicine | Admitting: Emergency Medicine

## 2014-08-16 ENCOUNTER — Encounter (HOSPITAL_COMMUNITY): Payer: Self-pay | Admitting: Emergency Medicine

## 2014-08-16 DIAGNOSIS — Z720 Tobacco use: Secondary | ICD-10-CM | POA: Insufficient documentation

## 2014-08-16 DIAGNOSIS — Z880 Allergy status to penicillin: Secondary | ICD-10-CM | POA: Insufficient documentation

## 2014-08-16 DIAGNOSIS — R21 Rash and other nonspecific skin eruption: Secondary | ICD-10-CM | POA: Insufficient documentation

## 2014-08-16 DIAGNOSIS — K219 Gastro-esophageal reflux disease without esophagitis: Secondary | ICD-10-CM | POA: Insufficient documentation

## 2014-08-16 DIAGNOSIS — I10 Essential (primary) hypertension: Secondary | ICD-10-CM | POA: Insufficient documentation

## 2014-08-16 DIAGNOSIS — Z79899 Other long term (current) drug therapy: Secondary | ICD-10-CM | POA: Insufficient documentation

## 2014-08-16 DIAGNOSIS — Z7952 Long term (current) use of systemic steroids: Secondary | ICD-10-CM | POA: Insufficient documentation

## 2014-08-16 MED ORDER — HYDROXYZINE HCL 25 MG PO TABS: 25.0000 mg | ORAL_TABLET | Freq: Four times a day (QID) | ORAL | Status: DC

## 2014-08-16 MED ORDER — PREDNISONE 10 MG PO TABS: ORAL_TABLET | ORAL | Status: DC

## 2014-08-16 MED ORDER — DEXAMETHASONE SODIUM PHOSPHATE 4 MG/ML IJ SOLN
10.0000 mg | Freq: Once | INTRAMUSCULAR | Status: AC
  Administered 2014-08-16: 10 mg via INTRAMUSCULAR
  Filled 2014-08-16: qty 3

## 2014-08-16 MED ORDER — HYDROXYZINE HCL 25 MG PO TABS
25.0000 mg | ORAL_TABLET | Freq: Once | ORAL | Status: AC
  Administered 2014-08-16: 25 mg via ORAL
  Filled 2014-08-16: qty 1

## 2014-08-16 NOTE — Discharge Instructions (Signed)
Rash A rash is a change in the color or feel of your skin. There are many different types of rashes. You may have other problems along with your rash. HOME CARE  Avoid the thing that caused your rash.  Do not scratch your rash.  You may take cools baths to help stop itching.  Only take medicines as told by your doctor.  Keep all doctor visits as told. GET HELP RIGHT AWAY IF:   Your pain, puffiness (swelling), or redness gets worse.  You have a fever.  You have new or severe problems.  You have body aches, watery poop (diarrhea), or you throw up (vomit).  Your rash is not better after 3 days. MAKE SURE YOU:   Understand these instructions.  Will watch your condition.  Will get help right away if you are not doing well or get worse. Document Released: 03/29/2008 Document Revised: 01/03/2012 Document Reviewed: 07/26/2011 ExitCare Patient Information 2015 ExitCare, LLC. This information is not intended to replace advice given to you by your health care provider. Make sure you discuss any questions you have with your health care provider.  

## 2014-08-16 NOTE — ED Notes (Signed)
Pt presents with rash to bilateral upper and lower ext. times 2 weeks, progressive worsening. Pt states he works outside. [facility], was told rash was due to "smoking" and given antibiotic which he completed with no relief. Rash is dry, in multiple states of healing. No temp.

## 2014-08-18 NOTE — ED Provider Notes (Signed)
CSN: [id number]     Arrival date & time 08/16/14  0920 History   First MD Initiated Contact with Patient 08/16/14 [phone]     Chief Complaint  Patient presents with  . Rash     (Consider location/radiation/quality/duration/timing/severity/associated sxs/prior Treatment) HPI  [name] is a 53 y.o. male who presents to the Emergency Department complaining of rash to both upper and lower extremities.  He states the rash has been present for two weeks.  He describes the rash as "severely itchy".  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  He also states that he was seen at another hospital ED at was treated with an antibiotic which did not help his symptoms.  He states the rash is continuing to spread and no OTC medications have helped his symptoms.  He denies fever, swelling, joint pain, difficulty swallowing or breathing.    Past Medical History  Diagnosis Date  . Acid reflux   . Hypertension    Past Surgical History  Procedure Laterality Date  . Cholecystectomy     Family History  Problem Relation Age of Onset  . Hypertension Father   . Heart disease Father   . Hyperlipidemia Father   . Diabetes Father    History  Substance Use Topics  . Smoking status: Current Every Day Smoker    Last Attempt to Quit: 01/15/2012  . Smokeless tobacco: Not on file  . Alcohol Use: No    Review of Systems  Constitutional: Negative for fever, chills, activity change and appetite change.  HENT: Negative for facial swelling, sore throat and trouble swallowing.   Respiratory: Negative for chest tightness, shortness of breath and wheezing.   Gastrointestinal: Negative for nausea and vomiting.  Musculoskeletal: Negative for myalgias, neck pain and neck stiffness.  Skin: Positive for rash. Negative for wound.  Neurological: Negative for dizziness, weakness, numbness and headaches.  All other systems reviewed and are  negative.     Allergies  Morphine and related and Penicillins  Home Medications   Prior to Admission medications   Medication Sig Start Date End Date Taking? Authorizing Provider  hydrochlorothiazide (HYDRODIURIL) 25 MG tablet Take 0.5 tablets (12.5 mg total) by mouth daily. 03/06/14   Deatra CanterWilliam J Oxford, FNP  hydrOXYzine (ATARAX/VISTARIL) 25 MG tablet Take 1 tablet (25 mg total) by mouth every 6 (six) hours. May cause drowiness 08/16/14   Navea Woodrow L. Annalycia Done, PA-C  metoprolol tartrate (LOPRESSOR) 25 MG tablet Take 1 tablet (25 mg total) by mouth daily. 03/06/14   Deatra CanterWilliam J Oxford, FNP  omeprazole (PRILOSEC) 40 MG capsule Take 1 capsule (40 mg total) by mouth daily. 03/06/14   Deatra CanterWilliam J Oxford, FNP  predniSONE (DELTASONE) 10 MG tablet Take 6 tablets day one, 5 tablets day two, 4 tablets day three, 3 tablets day four, 2 tablets day five, then 1 tablet day six 08/16/14   Nuno Brubacher L. Kenderick Kobler, PA-C   BP 168/103  Pulse 61  Temp(Src) 98.1 F (36.7 C) (Oral)  Resp 18  SpO2 99% Physical Exam  Nursing note and vitals reviewed. Constitutional: He is oriented to person, place, and time. He appears well-developed and well-nourished. No distress.  HENT:  Head: Normocephalic and atraumatic.  Mouth/Throat: Uvula is midline, oropharynx is clear and moist and mucous membranes are normal. No trismus in the jaw. No uvula swelling.  Neck: Normal range of motion. Neck supple.  Cardiovascular: Normal rate, regular rhythm,  normal heart sounds and intact distal pulses.   No murmur heard. Pulmonary/Chest: Effort normal and breath sounds normal. No respiratory distress.  Musculoskeletal: Normal range of motion. He exhibits no edema and no tenderness.  Lymphadenopathy:    He has no cervical adenopathy.  Neurological: He is alert and oriented to person, place, and time. He exhibits normal muscle tone. Coordination normal.  Skin: Skin is warm. Rash noted.  Multiple erythematous papules to the bilateral upper and  lower extremities with multiple excoriations.  No surrounding erythema, pustules, vesicles or petechiae.  No edema    ED Course  Procedures (including critical care time) Labs Review Labs Reviewed - No data to display  Imaging Review No results found.   EKG Interpretation None      MDM   Final diagnoses:  Rash/skin eruption    multiple excoriations and maculopapular rash to the bilateral upper and lower extremities.  Appears c/w plant dermatitis.  Hands and feet area spared, pt is well appearing.  VSS.  Will treat with prednisone taper and vistaril.  Given referral for dermatology and pt agrees to f/u with derm or his PMD.      Capone Schwinn L. Trisha Mangleriplett, PA-C 08/18/14 2058

## 2014-08-20 NOTE — ED Provider Notes (Signed)
Medical screening examination/treatment/procedure(s) were performed by non-physician practitioner and as supervising physician I was immediately available for consultation/collaboration.   EKG Interpretation None        [name] L [name], MD 08/20/14 0749 

## 2016-01-19 ENCOUNTER — Encounter (HOSPITAL_COMMUNITY): Payer: Self-pay | Admitting: Emergency Medicine

## 2016-01-19 ENCOUNTER — Emergency Department (HOSPITAL_COMMUNITY): Payer: BLUE CROSS/BLUE SHIELD

## 2016-01-19 ENCOUNTER — Emergency Department (HOSPITAL_COMMUNITY)
Admission: EM | Admit: 2016-01-19 | Discharge: 2016-01-19 | Disposition: A | Discharge status: Home / Self Care | Payer: BLUE CROSS/BLUE SHIELD | Attending: Emergency Medicine | Admitting: Emergency Medicine

## 2016-01-19 DIAGNOSIS — Y9339 Activity, other involving climbing, rappelling and jumping off: Secondary | ICD-10-CM | POA: Diagnosis not present

## 2016-01-19 DIAGNOSIS — I10 Essential (primary) hypertension: Secondary | ICD-10-CM | POA: Diagnosis not present

## 2016-01-19 DIAGNOSIS — Z79899 Other long term (current) drug therapy: Secondary | ICD-10-CM | POA: Insufficient documentation

## 2016-01-19 DIAGNOSIS — Y929 Unspecified place or not applicable: Secondary | ICD-10-CM | POA: Diagnosis not present

## 2016-01-19 DIAGNOSIS — F17210 Nicotine dependence, cigarettes, uncomplicated: Secondary | ICD-10-CM | POA: Diagnosis not present

## 2016-01-19 DIAGNOSIS — S8991XA Unspecified injury of right lower leg, initial encounter: Secondary | ICD-10-CM | POA: Diagnosis present

## 2016-01-19 DIAGNOSIS — S82141A Displaced bicondylar fracture of right tibia, initial encounter for closed fracture: Secondary | ICD-10-CM

## 2016-01-19 DIAGNOSIS — S82121A Displaced fracture of lateral condyle of right tibia, initial encounter for closed fracture: Secondary | ICD-10-CM | POA: Insufficient documentation

## 2016-01-19 DIAGNOSIS — Y999 Unspecified external cause status: Secondary | ICD-10-CM | POA: Diagnosis not present

## 2016-01-19 MED ORDER — HYDROCODONE-ACETAMINOPHEN 5-325 MG PO TABS: 1.0000 | ORAL_TABLET | Freq: Once | ORAL | Status: DC

## 2016-01-19 MED ORDER — HYDROCODONE-ACETAMINOPHEN 5-325 MG PO TABS: 1.0000 | ORAL_TABLET | ORAL | Status: DC | PRN

## 2016-01-19 MED ORDER — OXYCODONE-ACETAMINOPHEN 5-325 MG PO TABS
1.0000 | ORAL_TABLET | Freq: Once | ORAL | Status: AC
  Administered 2016-01-19: 1 via ORAL
  Filled 2016-01-19: qty 1

## 2016-01-19 MED ORDER — DICLOFENAC SODIUM 50 MG PO TBEC: 50.0000 mg | DELAYED_RELEASE_TABLET | Freq: Two times a day (BID) | ORAL | Status: DC
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
## 2016-01-21 ENCOUNTER — Encounter: Payer: Self-pay | Admitting: Orthopaedic Surgery

## 2016-01-21 ENCOUNTER — Ambulatory Visit (INDEPENDENT_AMBULATORY_CARE_PROVIDER_SITE_OTHER): Payer: BLUE CROSS/BLUE SHIELD | Admitting: Orthopaedic Surgery

## 2016-01-21 VITALS — BP 165/91 | HR 64 | Temp 98.1°F | Ht 69.0 in | Wt 200.0 lb

## 2016-01-21 DIAGNOSIS — S82141A Displaced bicondylar fracture of right tibia, initial encounter for closed fracture: Secondary | ICD-10-CM | POA: Diagnosis not present

## 2016-01-21 NOTE — Patient Instructions (Signed)
We will schedule CT scan for you and call you with appointment
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
## 2016-01-23 ENCOUNTER — Ambulatory Visit (HOSPITAL_COMMUNITY)
Admission: RE | Admit: 2016-01-23 | Discharge: 2016-01-23 | Disposition: A | Discharge status: Home / Self Care | Payer: BLUE CROSS/BLUE SHIELD | Source: Ambulatory Visit | Attending: Orthopaedic Surgery | Admitting: Orthopaedic Surgery

## 2016-01-23 DIAGNOSIS — W1789XD Other fall from one level to another, subsequent encounter: Secondary | ICD-10-CM | POA: Diagnosis not present

## 2016-01-23 DIAGNOSIS — M1711 Unilateral primary osteoarthritis, right knee: Secondary | ICD-10-CM | POA: Diagnosis not present

## 2016-01-23 DIAGNOSIS — S82141D Displaced bicondylar fracture of right tibia, subsequent encounter for closed fracture with routine healing: Secondary | ICD-10-CM | POA: Insufficient documentation

## 2016-01-23 DIAGNOSIS — S82141A Displaced bicondylar fracture of right tibia, initial encounter for closed fracture: Secondary | ICD-10-CM

## 2016-01-28 ENCOUNTER — Ambulatory Visit (INDEPENDENT_AMBULATORY_CARE_PROVIDER_SITE_OTHER): Payer: Self-pay | Admitting: Orthopaedic Surgery

## 2016-01-28 VITALS — BP 156/99 | HR 88 | Temp 97.9°F | Ht 69.0 in | Wt 200.0 lb

## 2016-01-28 DIAGNOSIS — S82141D Displaced bicondylar fracture of right tibia, subsequent encounter for closed fracture with routine healing: Secondary | ICD-10-CM

## 2016-01-28 MED ORDER — HYDROCODONE-ACETAMINOPHEN 5-325 MG PO TABS: 1.0000 | ORAL_TABLET | ORAL | Status: DC | PRN

## 2016-01-28 NOTE — Progress Notes (Signed)
CC:  My knee is better.  CT of the right knee shows subacute fracture of the lateral tibial plateau with old injury to the medial tibial plateau.   I have informed him of the findings.  His pain is less.  He is doing well.  Encounter Diagnosis  Name Primary?  . Tibial plateau fracture, right, closed, with routine healing, subsequent encounter Yes    I will see him in two weeks with x-rays at that time of the right knee.  Continue crutches and knee immobilizer.

## 2016-02-11 ENCOUNTER — Ambulatory Visit: Payer: BLUE CROSS/BLUE SHIELD | Admitting: Orthopaedic Surgery

## 2016-02-11 ENCOUNTER — Encounter: Payer: Self-pay | Admitting: Orthopaedic Surgery

## 2016-02-11 ENCOUNTER — Ambulatory Visit (INDEPENDENT_AMBULATORY_CARE_PROVIDER_SITE_OTHER): Payer: BLUE CROSS/BLUE SHIELD

## 2016-02-11 VITALS — BP 154/106 | HR 82 | Temp 98.6°F | Resp 16 | Ht 69.0 in | Wt 203.0 lb

## 2016-02-11 DIAGNOSIS — Z91199 Patient's noncompliance with other medical treatment and regimen due to unspecified reason: Secondary | ICD-10-CM

## 2016-02-11 DIAGNOSIS — Z9119 Patient's noncompliance with other medical treatment and regimen: Secondary | ICD-10-CM

## 2016-02-11 DIAGNOSIS — S82141D Displaced bicondylar fracture of right tibia, subsequent encounter for closed fracture with routine healing: Secondary | ICD-10-CM

## 2016-02-11 DIAGNOSIS — S82141A Displaced bicondylar fracture of right tibia, initial encounter for closed fracture: Secondary | ICD-10-CM | POA: Diagnosis not present

## 2016-02-11 NOTE — Progress Notes (Signed)
Patient ID: [name], male   DOB: 11/26/1960, 55 y.o.   MRN: [id number]  CC:  My knee is better.  His right knee is not hurting, no swelling.  He has no locking, no discomfort.  X-rays were done today.  He has healing lateral tibial plateau nondisplaced.  Encounter Diagnosis  Name Primary?  . Tibial plateau fracture, right, closed, with routine healing, subsequent encounter Yes   He can bear weight as tolerated now. He has been walking on it anyway, despite my precautions otherwise.  I will see him in one month.  X-rays on return.  Call if any problem.

## 2016-02-11 NOTE — Patient Instructions (Addendum)
Activities as tolearated No jumping off of anything

## 2016-02-16 ENCOUNTER — Telehealth: Payer: Self-pay | Admitting: Orthopaedic Surgery

## 2016-02-16 NOTE — Telephone Encounter (Signed)
Hydrocodone-Acetaminophen  7.5/325 mg    Qty 60 Tablets °

## 2016-02-17 MED ORDER — HYDROCODONE-ACETAMINOPHEN 5-325 MG PO TABS: 1.0000 | ORAL_TABLET | ORAL | Status: DC | PRN

## 2016-02-17 NOTE — Telephone Encounter (Signed)
Rx done. 

## 2016-03-10 ENCOUNTER — Ambulatory Visit (INDEPENDENT_AMBULATORY_CARE_PROVIDER_SITE_OTHER): Payer: BLUE CROSS/BLUE SHIELD

## 2016-03-10 ENCOUNTER — Encounter: Payer: Self-pay | Admitting: Orthopaedic Surgery

## 2016-03-10 ENCOUNTER — Ambulatory Visit (INDEPENDENT_AMBULATORY_CARE_PROVIDER_SITE_OTHER): Payer: BLUE CROSS/BLUE SHIELD | Admitting: Orthopaedic Surgery

## 2016-03-10 VITALS — BP 184/120 | HR 87 | Temp 98.1°F | Resp 16 | Ht 69.0 in | Wt 200.0 lb

## 2016-03-10 DIAGNOSIS — S82141D Displaced bicondylar fracture of right tibia, subsequent encounter for closed fracture with routine healing: Secondary | ICD-10-CM

## 2016-03-10 MED ORDER — HYDROCODONE-ACETAMINOPHEN 5-325 MG PO TABS: 1.0000 | ORAL_TABLET | ORAL | Status: DC | PRN

## 2016-03-10 NOTE — Progress Notes (Signed)
CC:  My right knee feels pretty good  He has done well with the right knee lateral nondisplaced tibial plateau fracture.  He has some pain at times.  He does a very manual type of work.  He has no new trauma.  Encounter Diagnosis  Name Primary?  . Tibial plateau fracture, right, closed, with routine healing, subsequent encounter Yes    I will see him in one month.  Pain medicine refilled.  Call if any problem.

## 2016-04-08 ENCOUNTER — Encounter: Payer: Self-pay | Admitting: Orthopaedic Surgery

## 2016-04-08 ENCOUNTER — Ambulatory Visit: Payer: BLUE CROSS/BLUE SHIELD | Admitting: Orthopaedic Surgery

## 2016-04-08 VITALS — BP 175/101 | HR 83 | Temp 98.1°F | Ht 69.0 in | Wt 197.0 lb

## 2016-04-08 DIAGNOSIS — S82141D Displaced bicondylar fracture of right tibia, subsequent encounter for closed fracture with routine healing: Secondary | ICD-10-CM

## 2016-04-08 MED ORDER — HYDROCODONE-ACETAMINOPHEN 5-325 MG PO TABS: 1.0000 | ORAL_TABLET | ORAL | Status: DC | PRN

## 2016-04-08 NOTE — Progress Notes (Signed)
CC:  My knees swells after a long day  He has some swelling of the right knee and feeling of tenderness.  He has no giving way, no locking.  NV is intact. ROM is 0 to 115.  He has crepitus.  Encounter Diagnosis  Name Primary?  . Tibial plateau fracture, right, closed, with routine healing, subsequent encounter Yes    I will see him as needed.  Call if any problem.  Precautions discussed.  Electronically Signed Darreld McleanWayne Terik Haughey, MD 6/15/20178:40 AM

## 2017-06-26 ENCOUNTER — Emergency Department (HOSPITAL_COMMUNITY)
Admission: EM | Admit: 2017-06-26 | Discharge: 2017-06-26 | Disposition: A | Discharge status: Home / Self Care | Payer: BLUE CROSS/BLUE SHIELD | Attending: Emergency Medicine | Admitting: Emergency Medicine

## 2017-06-26 ENCOUNTER — Encounter (HOSPITAL_COMMUNITY): Payer: Self-pay | Admitting: *Deleted

## 2017-06-26 ENCOUNTER — Emergency Department (HOSPITAL_COMMUNITY): Payer: BLUE CROSS/BLUE SHIELD

## 2017-06-26 DIAGNOSIS — R1084 Generalized abdominal pain: Secondary | ICD-10-CM | POA: Diagnosis present

## 2017-06-26 DIAGNOSIS — N201 Calculus of ureter: Secondary | ICD-10-CM | POA: Insufficient documentation

## 2017-06-26 DIAGNOSIS — N23 Unspecified renal colic: Secondary | ICD-10-CM

## 2017-06-26 DIAGNOSIS — F17210 Nicotine dependence, cigarettes, uncomplicated: Secondary | ICD-10-CM | POA: Diagnosis not present

## 2017-06-26 DIAGNOSIS — Z79899 Other long term (current) drug therapy: Secondary | ICD-10-CM | POA: Diagnosis not present

## 2017-06-26 DIAGNOSIS — I10 Essential (primary) hypertension: Secondary | ICD-10-CM | POA: Insufficient documentation

## 2017-06-26 HISTORY — DX: Calculus of kidney: N20.0

## 2017-06-26 LAB — COMPREHENSIVE METABOLIC PANEL
ALT: 17 U/L (ref 17–63)
AST: 23 U/L (ref 15–41)
Albumin: 4 g/dL (ref 3.5–5.0)
Alkaline Phosphatase: 70 U/L (ref 38–126)
Anion gap: 11 (ref 5–15)
BUN: 35 mg/dL — ABNORMAL HIGH (ref 6–20)
CO2: 25 mmol/L (ref 22–32)
Calcium: 9 mg/dL (ref 8.9–10.3)
Chloride: 103 mmol/L (ref 101–111)
Creatinine, Ser: 1.6 mg/dL — ABNORMAL HIGH (ref 0.61–1.24)
GFR calc Af Amer: 55 mL/min — ABNORMAL LOW (ref >60)
GFR calc non Af Amer: 47 mL/min — ABNORMAL LOW (ref >60)
Glucose, Bld: 115 mg/dL — ABNORMAL HIGH (ref 65–99)
Potassium: 3.7 mmol/L (ref 3.5–5.1)
Sodium: 139 mmol/L (ref 135–145)
Total Bilirubin: 0.6 mg/dL (ref 0.3–1.2)
Total Protein: 7.7 g/dL (ref 6.5–8.1)

## 2017-06-26 LAB — CBC
HCT: 43.5 % (ref 39.0–52.0)
Hemoglobin: 14.8 g/dL (ref 13.0–17.0)
MCH: 31.4 pg (ref 26.0–34.0)
MCHC: 34 g/dL (ref 30.0–36.0)
MCV: 92.2 fL (ref 78.0–100.0)
Platelets: 260 10*3/uL (ref 150–400)
RBC: 4.72 MIL/uL (ref 4.22–5.81)
RDW: 14.1 % (ref 11.5–15.5)
WBC: 13.7 10*3/uL — ABNORMAL HIGH (ref 4.0–10.5)

## 2017-06-26 LAB — URINALYSIS, ROUTINE W REFLEX MICROSCOPIC
Bacteria, UA: NONE SEEN
Bilirubin Urine: NEGATIVE
Glucose, UA: NEGATIVE mg/dL
Ketones, ur: 5 mg/dL — AB
Leukocytes, UA: NEGATIVE
Nitrite: NEGATIVE
Protein, ur: 100 mg/dL — AB
Specific Gravity, Urine: 1.031 — ABNORMAL HIGH (ref 1.005–1.030)
pH: 5 (ref 5.0–8.0)

## 2017-06-26 LAB — DIFFERENTIAL
Basophils Absolute: 0 10*3/uL (ref 0.0–0.1)
Basophils Relative: 0 %
Eosinophils Absolute: 0 10*3/uL (ref 0.0–0.7)
Eosinophils Relative: 0 %
Lymphocytes Relative: 15 %
Lymphs Abs: 1.9 10*3/uL (ref 0.7–4.0)
Monocytes Absolute: 0.8 10*3/uL (ref 0.1–1.0)
Monocytes Relative: 6 %
Neutro Abs: 10.3 10*3/uL — ABNORMAL HIGH (ref 1.7–7.7)
Neutrophils Relative %: 79 %

## 2017-06-26 LAB — LIPASE, BLOOD: Lipase: 20 U/L (ref 11–51)

## 2017-06-26 MED ORDER — SODIUM CHLORIDE 0.9 % IV SOLN
INTRAVENOUS | Status: DC
  Administered 2017-06-26: 16:00:00 via INTRAVENOUS

## 2017-06-26 MED ORDER — HYDROMORPHONE HCL 1 MG/ML IJ SOLN
1.0000 mg | Freq: Once | INTRAMUSCULAR | Status: AC
  Administered 2017-06-26: 1 mg via INTRAVENOUS
  Filled 2017-06-26: qty 1

## 2017-06-26 MED ORDER — ONDANSETRON 4 MG PO TBDP: 4.0000 mg | ORAL_TABLET | Freq: Three times a day (TID) | ORAL | 0 refills | Status: DC | PRN

## 2017-06-26 MED ORDER — ONDANSETRON HCL 4 MG/2ML IJ SOLN
4.0000 mg | INTRAMUSCULAR | Status: DC | PRN
  Administered 2017-06-26: 4 mg via INTRAVENOUS
  Filled 2017-06-26: qty 2

## 2017-06-26 MED ORDER — TAMSULOSIN HCL 0.4 MG PO CAPS
0.4000 mg | ORAL_CAPSULE | Freq: Every day | ORAL | 0 refills | Status: DC
Start: 2017-06-26 — End: 2018-08-14

## 2017-06-26 NOTE — Discharge Instructions (Signed)
Take the prescriptions as directed.  Call the Urologist on Tuesday to schedule a follow up appointment this week.  Return to the Emergency Department immediately if worsening.

## 2017-06-26 NOTE — ED Triage Notes (Signed)
Pt c/o generalized abdominal pain that radiates around to bilateral flank areas, nausea, vomiting since Friday. Pt [facility] and was told he had kidney stones. Pt was having trouble urinating Friday evening, but now is not having any difficulties. Denies dysuria, hematuria, fever.

## 2017-06-26 NOTE — ED Provider Notes (Signed)
[facility]-EMERGENCY DEPT Provider Note   CSN: [id number] Arrival date & time: 06/26/17  1318     History   Chief Complaint Chief Complaint  Patient presents with  . Abdominal Pain    HPI [name] is a 56 y.o. male.  HPI  Pt was seen at 1535.  Per pt, c/o gradual onset and persistence of constant generalized abd "pain" for the past 2 days.  Has been associated with multiple intermittent episodes of N/V.  Describes the abd pain as "aching" with radiation into his bilat lower back area.  Denies diarrhea, no fevers, no back pain, no rash, no CP/SOB, no black or blood in stools or emesis, denies hematuria/dysuria, no testicular pain/swelling. Pt states he was evaluated at OSH 2 days ago for this complaint, dx kidney stones, rx "some pain meds."     Past Medical History:  Diagnosis Date  . Acid reflux   . Hypertension   . Kidney stone     Patient Active Problem List   Diagnosis Date Noted  . Hypertension 02/17/2014    Past Surgical History:  Procedure Laterality Date  . CHOLECYSTECTOMY         Home Medications    Prior to Admission medications   Medication Sig Start Date End Date Taking? Authorizing Provider  diclofenac (VOLTAREN) 50 MG EC tablet Take 1 tablet (50 mg total) by mouth 2 (two) times daily. 01/19/16   Janne NapoleonNeese, Hope M, NP  HYDROcodone-acetaminophen (NORCO/VICODIN) 5-325 MG tablet Take 1 tablet by mouth every 4 (four) hours as needed for moderate pain (Must last 30 days.  Do not take and drive a car or use machinery.). 04/08/16   Darreld McleanKeeling, Wayne, MD  hydrOXYzine (ATARAX/VISTARIL) 25 MG tablet Take 1 tablet (25 mg total) by mouth every 6 (six) hours. May cause drowiness 08/16/14   Triplett, Tammy, PA-C  metoprolol tartrate (LOPRESSOR) 25 MG tablet Take 25 mg by mouth 2 (two) times daily.    [provider]  omeprazole (PRILOSEC) 40 MG capsule Take 1 capsule (40 mg total) by mouth daily. 03/06/14   Deatra Canterxford, William J, FNP    Family History Family History    Problem Relation Age of Onset  . Hypertension Father   . Heart disease Father   . Hyperlipidemia Father   . Diabetes Father     Social History Social History  Substance Use Topics  . Smoking status: Current Every Day Smoker    Packs/day: 2.00    Types: Cigarettes    Last attempt to quit: 01/15/2012  . Smokeless tobacco: Never Used  . Alcohol use No     Allergies   Morphine and related and Penicillins   Review of Systems Review of Systems ROS: Statement: All systems negative except as marked or noted in the HPI; Constitutional: Negative for fever and chills. ; ; Eyes: Negative for eye pain, redness and discharge. ; ; ENMT: Negative for ear pain, hoarseness, nasal congestion, sinus pressure and sore throat. ; ; Cardiovascular: Negative for chest pain, palpitations, diaphoresis, dyspnea and peripheral edema. ; ; Respiratory: Negative for cough, wheezing and stridor. ; ; Gastrointestinal: +N/V, abd pain. Negative for diarrhea, blood in stool, hematemesis, jaundice and rectal bleeding. . ; ; Genitourinary: Negative for dysuria and hematuria. ; ; Genital:  No penile drainage or rash, no testicular pain or swelling, no scrotal rash or swelling. ;;  Musculoskeletal: +LBP. Negative for neck pain. Negative for swelling and trauma.; ; Skin: Negative for pruritus, rash, abrasions, blisters, bruising and skin  lesion.; ; Neuro: Negative for headache, lightheadedness and neck stiffness. Negative for weakness, altered level of consciousness, altered mental status, extremity weakness, paresthesias, involuntary movement, seizure and syncope.       Physical Exam Updated Vital Signs BP (!) 173/105 (BP Location: Right Arm)   Pulse (!) 103   Temp 98.1 F (36.7 C) (Oral)   Resp 18   Ht 5\' 9"  (1.753 m)   Wt 74.8 kg (165 lb)   SpO2 98%   BMI 24.37 kg/m   Physical Exam 1540: Physical examination:  Nursing notes reviewed; Vital signs and O2 SAT reviewed;  Constitutional: Well developed, Well  nourished, Uncomfortable appearing.; Head:  Normocephalic, atraumatic; Eyes: EOMI, PERRL, No scleral icterus; ENMT: Mouth and pharynx normal, Mucous membranes dry; Neck: Supple, Full range of motion, No lymphadenopathy; Cardiovascular: Regular rate and rhythm, No gallop; Respiratory: Breath sounds clear & equal bilaterally, No wheezes.  Speaking full sentences with ease, Normal respiratory effort/excursion; Chest: Nontender, Movement normal; Abdomen: Soft, Nontender, Nondistended, Normal bowel sounds; Genitourinary: No CVA tenderness; Spine:  No midline CS, TS, LS tenderness.;; Extremities: Pulses normal, No tenderness, No edema, No calf edema or asymmetry.; Neuro: AA&Ox3, Major CN grossly intact.  Speech clear. No gross focal motor or sensory deficits in extremities.; Skin: Color normal, Warm, Dry.   ED Treatments / Results  Labs (all labs ordered are listed, but only abnormal results are displayed)   EKG  EKG Interpretation None       Radiology   Procedures Procedures (including critical care time)  Medications Ordered in ED Medications  0.9 %  sodium chloride infusion (not administered)  ondansetron (ZOFRAN) injection 4 mg (not administered)  HYDROmorphone (DILAUDID) injection 1 mg (not administered)     Initial Impression / Assessment and Plan / ED Course  I have reviewed the triage vital signs and the nursing notes.  Pertinent labs & imaging results that were available during my care of the patient were reviewed by me and considered in my medical decision making (see chart for details).  MDM Reviewed: previous chart, nursing note and vitals Reviewed previous: labs Interpretation: labs and CT scan   Results for orders placed or performed during the hospital encounter of 06/26/17  Lipase, blood  Result Value Ref Range   Lipase 20 11 - 51 U/L  Comprehensive metabolic panel  Result Value Ref Range   Sodium 139 135 - 145 mmol/L   Potassium 3.7 3.5 - 5.1 mmol/L    Chloride 103 101 - 111 mmol/L   CO2 25 22 - 32 mmol/L   Glucose, Bld 115 (H) 65 - 99 mg/dL   BUN 35 (H) 6 - 20 mg/dL   Creatinine, Ser 8.11 (H) 0.61 - 1.24 mg/dL   Calcium 9.0 8.9 - 91.4 mg/dL   Total Protein 7.7 6.5 - 8.1 g/dL   Albumin 4.0 3.5 - 5.0 g/dL   AST 23 15 - 41 U/L   ALT 17 17 - 63 U/L   Alkaline Phosphatase 70 38 - 126 U/L   Total Bilirubin 0.6 0.3 - 1.2 mg/dL   GFR calc non Af Amer 47 (L) >60 mL/min   GFR calc Af Amer 55 (L) >60 mL/min   Anion gap 11 5 - 15  CBC  Result Value Ref Range   WBC 13.7 (H) 4.0 - 10.5 K/uL   RBC 4.72 4.22 - 5.81 MIL/uL   Hemoglobin 14.8 13.0 - 17.0 g/dL   HCT 78.2 95.6 - 21.3 %   MCV 92.2 78.0 -  100.0 fL   MCH 31.4 26.0 - 34.0 pg   MCHC 34.0 30.0 - 36.0 g/dL   RDW 16.1 09.6 - 04.5 %   Platelets 260 150 - 400 K/uL  Urinalysis, Routine w reflex microscopic  Result Value Ref Range   Color, Urine YELLOW YELLOW   APPearance HAZY (A) CLEAR   Specific Gravity, Urine 1.031 (H) 1.005 - 1.030   pH 5.0 5.0 - 8.0   Glucose, UA NEGATIVE NEGATIVE mg/dL   Hgb urine dipstick SMALL (A) NEGATIVE   Bilirubin Urine NEGATIVE NEGATIVE   Ketones, ur 5 (A) NEGATIVE mg/dL   Protein, ur 409 (A) NEGATIVE mg/dL   Nitrite NEGATIVE NEGATIVE   Leukocytes, UA NEGATIVE NEGATIVE   RBC / HPF 6-30 0 - 5 RBC/hpf   WBC, UA 0-5 0 - 5 WBC/hpf   Bacteria, UA NONE SEEN NONE SEEN   Squamous Epithelial / LPF 0-5 (A) NONE SEEN   Mucus PRESENT   Differential  Result Value Ref Range   Neutrophils Relative % 79 %   Neutro Abs 10.3 (H) 1.7 - 7.7 K/uL   Lymphocytes Relative 15 %   Lymphs Abs 1.9 0.7 - 4.0 K/uL   Monocytes Relative 6 %   Monocytes Absolute 0.8 0.1 - 1.0 K/uL   Eosinophils Relative 0 %   Eosinophils Absolute 0.0 0.0 - 0.7 K/uL   Basophils Relative 0 %   Basophils Absolute 0.0 0.0 - 0.1 K/uL   Ct Renal Stone Study Result Date: 06/26/2017 CLINICAL DATA:  Flank pain.  Stone disease suspected. EXAM: CT ABDOMEN AND PELVIS WITHOUT CONTRAST TECHNIQUE:  Multidetector CT imaging of the abdomen and pelvis was performed following the standard protocol without IV contrast. COMPARISON:  06/24/2017 CT abdomen/ pelvis. FINDINGS: Lower chest: Peripheral left lower lobe 3 mm solid pulmonary nodule (series 4/image 9), stable since 06/18/2007 and considered benign. No acute abnormality at the lung bases . Hepatobiliary: Normal liver with no liver mass. Cholecystectomy . No biliary ductal dilatation. Pancreas: Normal, with no mass or duct dilation. Spleen: Normal size. No mass. Adrenals/Urinary Tract: Normal adrenals. Persistent obstructing 3 mm distal right ureteral stone just above the right ureterovesical junction, with mild right hydroureteronephrosis. The right renal collecting system and right ureter remain opacified by excreted contrast from the recent 06/24/2017 IV contrast-enhanced CT study. Asymmetric enlargement of the right kidney with patchy right contrast nephrogram. Simple 1.3 cm medial upper right renal cyst. Stable subcentimeter hypodense posterior lower right renal cortical lesion, too small to characterize. No additional contour deforming renal lesions. No left renal stones. No left hydronephrosis. Normal caliber left ureter, with no left ureteral stones. Excreted contrast within the nondistended bladder, which otherwise appears grossly normal. Stomach/Bowel: Grossly normal stomach. Normal caliber small bowel with no small bowel wall thickening. Normal appendix. Normal Ritter bowel with no diverticulosis, Ritter bowel wall thickening or pericolonic fat stranding. Vascular/Lymphatic: Atherosclerotic abdominal aorta with stable 3.0 cm infrarenal abdominal aortic aneurysm. No pathologically enlarged lymph nodes in the abdomen or pelvis. Reproductive: Normal size prostate. Nonspecific extensive coarse internal prostatic calcification. Other: No pneumoperitoneum, ascites or focal fluid collection. Musculoskeletal: No aggressive appearing focal osseous lesions. Mild  thoracolumbar spondylosis. IMPRESSION: 1. Persistent obstructing 3 mm distal right ureteral stone just above the right UVJ with mild right hydroureteronephrosis. Persistent IV contrast within the right renal parenchyma, right renal collecting system and right ureter due to the obstruction. 2.  Aortic Atherosclerosis (ICD10-I70.0). 3. Infrarenal 3.0 cm Abdominal Aortic Aneurysm (ICD10-I71.9). Recommend follow-up aortic ultrasound in 3 years.  This recommendation follows ACR consensus guidelines: White Paper of the ACR Incidental Findings Committee II on Vascular Findings. J Am Coll Radiol 2013; 10:789-794. Electronically Signed   By: Delbert Phenix M.D.   On: 06/26/2017 16:23     1750:  Pt's pain under control. States he feels better and wants to go home now. BUN/Cr mildly elevated from baseline, but previous values are from 3+ years ago. T/C to Uro Dr. Ronne Binning, case discussed, including:  HPI, pertinent PM/SHx, VS/PE, dx testing, ED course and treatment:  States delayed IV contrast excretion on CT scan there due to obstruction, no acute surgical intervention needed at this time, OK to d/c and f/u ofc if pt's pain under control, if not call back and pt will need admit.   1805:  Pt continues comfortable and wants to go home now. Dx and testing d/w pt and family.  Questions answered.  Verb understanding, agreeable to d/c home with outpt f/u.   Final Clinical Impressions(s) / ED Diagnoses   Final diagnoses:  None    New Prescriptions New Prescriptions   No medications on file     Samuel Jester, DO 06/29/17 2119

## 2018-08-11 ENCOUNTER — Other Ambulatory Visit: Payer: Self-pay

## 2018-08-11 ENCOUNTER — Emergency Department (HOSPITAL_COMMUNITY): Payer: Self-pay

## 2018-08-11 ENCOUNTER — Inpatient Hospital Stay (HOSPITAL_COMMUNITY)
Admission: EM | Admit: 2018-08-11 | Discharge: 2018-08-14 | DRG: 062 | Disposition: A | Discharge status: Rehabilitation Facility | Payer: Self-pay | Attending: Neurology | Admitting: Neurology

## 2018-08-11 ENCOUNTER — Encounter (HOSPITAL_COMMUNITY): Payer: Self-pay

## 2018-08-11 ENCOUNTER — Inpatient Hospital Stay (HOSPITAL_COMMUNITY): Payer: Self-pay

## 2018-08-11 DIAGNOSIS — E785 Hyperlipidemia, unspecified: Secondary | ICD-10-CM | POA: Diagnosis present

## 2018-08-11 DIAGNOSIS — I739 Peripheral vascular disease, unspecified: Secondary | ICD-10-CM | POA: Diagnosis present

## 2018-08-11 DIAGNOSIS — F1410 Cocaine abuse, uncomplicated: Secondary | ICD-10-CM | POA: Diagnosis present

## 2018-08-11 DIAGNOSIS — Z880 Allergy status to penicillin: Secondary | ICD-10-CM

## 2018-08-11 DIAGNOSIS — F17200 Nicotine dependence, unspecified, uncomplicated: Secondary | ICD-10-CM | POA: Diagnosis present

## 2018-08-11 DIAGNOSIS — E669 Obesity, unspecified: Secondary | ICD-10-CM | POA: Diagnosis present

## 2018-08-11 DIAGNOSIS — K219 Gastro-esophageal reflux disease without esophagitis: Secondary | ICD-10-CM | POA: Diagnosis present

## 2018-08-11 DIAGNOSIS — I10 Essential (primary) hypertension: Secondary | ICD-10-CM | POA: Diagnosis present

## 2018-08-11 DIAGNOSIS — F1910 Other psychoactive substance abuse, uncomplicated: Secondary | ICD-10-CM | POA: Diagnosis present

## 2018-08-11 DIAGNOSIS — F17210 Nicotine dependence, cigarettes, uncomplicated: Secondary | ICD-10-CM | POA: Diagnosis present

## 2018-08-11 DIAGNOSIS — R29707 NIHSS score 7: Secondary | ICD-10-CM | POA: Diagnosis present

## 2018-08-11 DIAGNOSIS — Z8249 Family history of ischemic heart disease and other diseases of the circulatory system: Secondary | ICD-10-CM

## 2018-08-11 DIAGNOSIS — Z87442 Personal history of urinary calculi: Secondary | ICD-10-CM

## 2018-08-11 DIAGNOSIS — I6350 Cerebral infarction due to unspecified occlusion or stenosis of unspecified cerebral artery: Secondary | ICD-10-CM | POA: Diagnosis present

## 2018-08-11 DIAGNOSIS — R062 Wheezing: Secondary | ICD-10-CM

## 2018-08-11 DIAGNOSIS — R29704 NIHSS score 4: Secondary | ICD-10-CM | POA: Diagnosis present

## 2018-08-11 DIAGNOSIS — G8194 Hemiplegia, unspecified affecting left nondominant side: Secondary | ICD-10-CM | POA: Diagnosis present

## 2018-08-11 DIAGNOSIS — I161 Hypertensive emergency: Secondary | ICD-10-CM | POA: Diagnosis present

## 2018-08-11 DIAGNOSIS — R471 Dysarthria and anarthria: Secondary | ICD-10-CM | POA: Diagnosis present

## 2018-08-11 DIAGNOSIS — Z79899 Other long term (current) drug therapy: Secondary | ICD-10-CM

## 2018-08-11 DIAGNOSIS — Z8349 Family history of other endocrine, nutritional and metabolic diseases: Secondary | ICD-10-CM

## 2018-08-11 DIAGNOSIS — N400 Enlarged prostate without lower urinary tract symptoms: Secondary | ICD-10-CM | POA: Diagnosis present

## 2018-08-11 DIAGNOSIS — R252 Cramp and spasm: Secondary | ICD-10-CM | POA: Diagnosis present

## 2018-08-11 DIAGNOSIS — Z6831 Body mass index (BMI) 31.0-31.9, adult: Secondary | ICD-10-CM

## 2018-08-11 DIAGNOSIS — F1290 Cannabis use, unspecified, uncomplicated: Secondary | ICD-10-CM | POA: Diagnosis present

## 2018-08-11 DIAGNOSIS — I6300 Cerebral infarction due to thrombosis of unspecified precerebral artery: Secondary | ICD-10-CM

## 2018-08-11 DIAGNOSIS — I5030 Unspecified diastolic (congestive) heart failure: Secondary | ICD-10-CM

## 2018-08-11 DIAGNOSIS — R29709 NIHSS score 9: Secondary | ICD-10-CM | POA: Diagnosis present

## 2018-08-11 DIAGNOSIS — I672 Cerebral atherosclerosis: Secondary | ICD-10-CM | POA: Diagnosis present

## 2018-08-11 DIAGNOSIS — R29711 NIHSS score 11: Secondary | ICD-10-CM | POA: Diagnosis present

## 2018-08-11 DIAGNOSIS — R29708 NIHSS score 8: Secondary | ICD-10-CM | POA: Diagnosis present

## 2018-08-11 DIAGNOSIS — I5189 Other ill-defined heart diseases: Secondary | ICD-10-CM

## 2018-08-11 DIAGNOSIS — Z23 Encounter for immunization: Secondary | ICD-10-CM

## 2018-08-11 DIAGNOSIS — I639 Cerebral infarction, unspecified: Principal | ICD-10-CM | POA: Diagnosis present

## 2018-08-11 DIAGNOSIS — Z885 Allergy status to narcotic agent status: Secondary | ICD-10-CM

## 2018-08-11 DIAGNOSIS — R29706 NIHSS score 6: Secondary | ICD-10-CM | POA: Diagnosis present

## 2018-08-11 LAB — CBC
HCT: 41.4 % (ref 39.0–52.0)
HCT: 42.8 % (ref 39.0–52.0)
Hemoglobin: 13.6 g/dL (ref 13.0–17.0)
Hemoglobin: 14.1 g/dL (ref 13.0–17.0)
MCH: 30.5 pg (ref 26.0–34.0)
MCH: 30.8 pg (ref 26.0–34.0)
MCHC: 32.9 g/dL (ref 30.0–36.0)
MCHC: 32.9 g/dL (ref 30.0–36.0)
MCV: 92.4 fL (ref 80.0–100.0)
MCV: 93.9 fL (ref 80.0–100.0)
Platelets: 220 10*3/uL (ref 150–400)
Platelets: 256 10*3/uL (ref 150–400)
RBC: 4.41 MIL/uL (ref 4.22–5.81)
RBC: 4.63 MIL/uL (ref 4.22–5.81)
RDW: 13.8 % (ref 11.5–15.5)
RDW: 13.8 % (ref 11.5–15.5)
WBC: 10 10*3/uL (ref 4.0–10.5)
WBC: 10.9 10*3/uL — ABNORMAL HIGH (ref 4.0–10.5)
nRBC: 0 % (ref 0.0–0.2)
nRBC: 0 % (ref 0.0–0.2)

## 2018-08-11 LAB — DIFFERENTIAL
Abs Immature Granulocytes: 0.05 10*3/uL (ref 0.00–0.07)
Basophils Absolute: 0.1 10*3/uL (ref 0.0–0.1)
Basophils Relative: 1 %
Eosinophils Absolute: 0.1 10*3/uL (ref 0.0–0.5)
Eosinophils Relative: 1 %
Immature Granulocytes: 1 %
Lymphocytes Relative: 14 %
Lymphs Abs: 1.5 10*3/uL (ref 0.7–4.0)
Monocytes Absolute: 0.5 10*3/uL (ref 0.1–1.0)
Monocytes Relative: 4 %
Neutro Abs: 8.8 10*3/uL — ABNORMAL HIGH (ref 1.7–7.7)
Neutrophils Relative %: 79 %

## 2018-08-11 LAB — ECHOCARDIOGRAM COMPLETE
Height: 67 in
Weight: 3262.81 oz

## 2018-08-11 LAB — COMPREHENSIVE METABOLIC PANEL
ALT: 29 U/L (ref 0–44)
AST: 24 U/L (ref 15–41)
Albumin: 4 g/dL (ref 3.5–5.0)
Alkaline Phosphatase: 62 U/L (ref 38–126)
Anion gap: 7 (ref 5–15)
BUN: 15 mg/dL (ref 6–20)
CO2: 25 mmol/L (ref 22–32)
Calcium: 8.7 mg/dL — ABNORMAL LOW (ref 8.9–10.3)
Chloride: 103 mmol/L (ref 98–111)
Creatinine, Ser: 0.93 mg/dL (ref 0.61–1.24)
GFR calc Af Amer: >60 mL/min (ref >60)
GFR calc non Af Amer: >60 mL/min (ref >60)
Glucose, Bld: 146 mg/dL — ABNORMAL HIGH (ref 70–99)
Potassium: 3.4 mmol/L — ABNORMAL LOW (ref 3.5–5.1)
Sodium: 135 mmol/L (ref 135–145)
Total Bilirubin: 0.4 mg/dL (ref 0.3–1.2)
Total Protein: 7.3 g/dL (ref 6.5–8.1)

## 2018-08-11 LAB — MRSA PCR SCREENING: MRSA by PCR: NEGATIVE

## 2018-08-11 LAB — LIPID PANEL
Cholesterol: 225 mg/dL — ABNORMAL HIGH (ref 0–200)
HDL: 33 mg/dL — ABNORMAL LOW (ref >40)
LDL Cholesterol: 152 mg/dL — ABNORMAL HIGH (ref 0–99)
Total CHOL/HDL Ratio: 6.8 RATIO
Triglycerides: 200 mg/dL — ABNORMAL HIGH (ref <150)
VLDL: 40 mg/dL (ref 0–40)

## 2018-08-11 LAB — APTT: aPTT: 26 seconds (ref 24–36)

## 2018-08-11 LAB — I-STAT CHEM 8, ED
BUN: 15 mg/dL (ref 6–20)
Calcium, Ion: 1.12 mmol/L — ABNORMAL LOW (ref 1.15–1.40)
Chloride: 101 mmol/L (ref 98–111)
Creatinine, Ser: 0.8 mg/dL (ref 0.61–1.24)
Glucose, Bld: 142 mg/dL — ABNORMAL HIGH (ref 70–99)
HCT: 43 % (ref 39.0–52.0)
Hemoglobin: 14.6 g/dL (ref 13.0–17.0)
Potassium: 3.7 mmol/L (ref 3.5–5.1)
Sodium: 136 mmol/L (ref 135–145)
TCO2: 23 mmol/L (ref 22–32)

## 2018-08-11 LAB — I-STAT TROPONIN, ED: Troponin i, poc: 0 ng/mL (ref 0.00–0.08)

## 2018-08-11 LAB — HEMOGLOBIN A1C
Hgb A1c MFr Bld: 5.6 % (ref 4.8–5.6)
Mean Plasma Glucose: 114.02 mg/dL

## 2018-08-11 LAB — HIV ANTIBODY (ROUTINE TESTING W REFLEX): HIV Screen 4th Generation wRfx: NONREACTIVE

## 2018-08-11 LAB — CBG MONITORING, ED: Glucose-Capillary: 137 mg/dL — ABNORMAL HIGH (ref 70–99)

## 2018-08-11 LAB — PROTIME-INR
INR: 0.92
Prothrombin Time: 12.3 seconds (ref 11.4–15.2)

## 2018-08-11 LAB — RPR: RPR Ser Ql: NONREACTIVE

## 2018-08-11 LAB — ETHANOL: Alcohol, Ethyl (B): <10 mg/dL (ref <10)

## 2018-08-11 MED ORDER — ACETAMINOPHEN 160 MG/5ML PO SOLN: 650.0000 mg | ORAL | Status: DC | PRN

## 2018-08-11 MED ORDER — AMLODIPINE BESYLATE 5 MG PO TABS
5.0000 mg | ORAL_TABLET | Freq: Every day | ORAL | Status: DC
  Administered 2018-08-11 – 2018-08-14 (×4): 5 mg via ORAL
  Filled 2018-08-11 (×4): qty 1

## 2018-08-11 MED ORDER — LABETALOL HCL 5 MG/ML IV SOLN: 10.0000 mg | Freq: Once | INTRAVENOUS | Status: DC | PRN

## 2018-08-11 MED ORDER — SODIUM CHLORIDE 0.9 % IV SOLN
50.0000 mL | Freq: Once | INTRAVENOUS | Status: AC
  Administered 2018-08-11: 50 mL via INTRAVENOUS

## 2018-08-11 MED ORDER — ONDANSETRON HCL 4 MG/2ML IJ SOLN
4.0000 mg | Freq: Once | INTRAMUSCULAR | Status: AC
  Administered 2018-08-11: 4 mg via INTRAVENOUS

## 2018-08-11 MED ORDER — STROKE: EARLY STAGES OF RECOVERY BOOK
Freq: Once | Status: AC
  Administered 2018-08-11: 1
  Filled 2018-08-11: qty 1

## 2018-08-11 MED ORDER — LABETALOL HCL 5 MG/ML IV SOLN
INTRAVENOUS | Status: AC
  Administered 2018-08-11: 20 mg
  Filled 2018-08-11: qty 4

## 2018-08-11 MED ORDER — ASPIRIN EC 325 MG PO TBEC: 325.0000 mg | DELAYED_RELEASE_TABLET | Freq: Every day | ORAL | Status: DC

## 2018-08-11 MED ORDER — IOPAMIDOL (ISOVUE-370) INJECTION 76%
100.0000 mL | Freq: Once | INTRAVENOUS | Status: AC | PRN
  Administered 2018-08-11: 100 mL via INTRAVENOUS

## 2018-08-11 MED ORDER — OMEPRAZOLE 20 MG PO CPDR
20.0000 mg | DELAYED_RELEASE_CAPSULE | Freq: Two times a day (BID) | ORAL | Status: DC
  Administered 2018-08-11 – 2018-08-14 (×8): 20 mg via ORAL
  Filled 2018-08-11 (×9): qty 1

## 2018-08-11 MED ORDER — AMLODIPINE BESY-BENAZEPRIL HCL 5-10 MG PO CAPS: 1.0000 | ORAL_CAPSULE | Freq: Every day | ORAL | Status: DC

## 2018-08-11 MED ORDER — ONDANSETRON HCL 4 MG/2ML IJ SOLN
INTRAMUSCULAR | Status: AC
  Filled 2018-08-11: qty 2

## 2018-08-11 MED ORDER — ACETAMINOPHEN 650 MG RE SUPP: 650.0000 mg | RECTAL | Status: DC | PRN

## 2018-08-11 MED ORDER — SODIUM CHLORIDE 0.9 % IV SOLN
50.0000 mL/h | INTRAVENOUS | Status: DC
  Administered 2018-08-11: 50 mL/h via INTRAVENOUS

## 2018-08-11 MED ORDER — ACETAMINOPHEN 325 MG PO TABS
650.0000 mg | ORAL_TABLET | ORAL | Status: DC | PRN
  Administered 2018-08-11 – 2018-08-14 (×4): 650 mg via ORAL
  Filled 2018-08-11 (×4): qty 2

## 2018-08-11 MED ORDER — ALTEPLASE 100 MG IV SOLR
INTRAVENOUS | Status: AC
  Filled 2018-08-11: qty 100

## 2018-08-11 MED ORDER — ASPIRIN EC 81 MG PO TBEC
81.0000 mg | DELAYED_RELEASE_TABLET | Freq: Every day | ORAL | Status: DC
  Administered 2018-08-12 – 2018-08-14 (×3): 81 mg via ORAL
  Filled 2018-08-11 (×3): qty 1

## 2018-08-11 MED ORDER — NICARDIPINE HCL IN NACL 20-0.86 MG/200ML-% IV SOLN
INTRAVENOUS | Status: AC
  Administered 2018-08-11: 5 mg/h via INTRAVENOUS
  Filled 2018-08-11: qty 200

## 2018-08-11 MED ORDER — ONDANSETRON HCL 4 MG/2ML IJ SOLN
4.0000 mg | INTRAMUSCULAR | Status: DC | PRN
  Administered 2018-08-11: 4 mg via INTRAVENOUS
  Filled 2018-08-11 (×2): qty 2

## 2018-08-11 MED ORDER — GABAPENTIN 300 MG PO CAPS
300.0000 mg | ORAL_CAPSULE | Freq: Three times a day (TID) | ORAL | Status: DC | PRN
  Administered 2018-08-11: 300 mg via ORAL
  Filled 2018-08-11: qty 1

## 2018-08-11 MED ORDER — NICARDIPINE HCL IN NACL 20-0.86 MG/200ML-% IV SOLN
0.0000 mg/h | INTRAVENOUS | Status: DC
  Administered 2018-08-11: 2.5 mg/h via INTRAVENOUS
  Administered 2018-08-11: 5 mg/h via INTRAVENOUS

## 2018-08-11 MED ORDER — BENAZEPRIL HCL 20 MG PO TABS
10.0000 mg | ORAL_TABLET | Freq: Every day | ORAL | Status: DC
  Administered 2018-08-11 – 2018-08-14 (×4): 10 mg via ORAL
  Filled 2018-08-11 (×4): qty 1

## 2018-08-11 MED ORDER — ACETAMINOPHEN-CODEINE #3 300-30 MG PO TABS
1.0000 | ORAL_TABLET | Freq: Four times a day (QID) | ORAL | Status: DC | PRN
  Administered 2018-08-11: 1 via ORAL
  Administered 2018-08-12: 2 via ORAL
  Filled 2018-08-11: qty 2
  Filled 2018-08-11: qty 1

## 2018-08-11 MED ORDER — CALCIUM CARBONATE ANTACID 500 MG PO CHEW: 1.0000 | CHEWABLE_TABLET | Freq: Once | ORAL | Status: DC

## 2018-08-11 MED ORDER — NICARDIPINE HCL IN NACL 20-0.86 MG/200ML-% IV SOLN: 0.0000 mg/h | INTRAVENOUS | Status: DC | PRN

## 2018-08-11 MED ORDER — CLOPIDOGREL BISULFATE 75 MG PO TABS
75.0000 mg | ORAL_TABLET | Freq: Every day | ORAL | Status: DC
  Administered 2018-08-11 – 2018-08-14 (×4): 75 mg via ORAL
  Filled 2018-08-11 (×4): qty 1

## 2018-08-11 MED ORDER — PANTOPRAZOLE SODIUM 40 MG PO TBEC: 40.0000 mg | DELAYED_RELEASE_TABLET | Freq: Every day | ORAL | Status: DC

## 2018-08-11 MED ORDER — TAMSULOSIN HCL 0.4 MG PO CAPS
0.4000 mg | ORAL_CAPSULE | Freq: Every day | ORAL | Status: DC
  Administered 2018-08-11 – 2018-08-13 (×3): 0.4 mg via ORAL
  Filled 2018-08-11 (×3): qty 1

## 2018-08-11 MED ORDER — ALTEPLASE (STROKE) FULL DOSE INFUSION
0.9000 mg/kg | Freq: Once | INTRAVENOUS | Status: AC
  Administered 2018-08-11: 75.5 mg via INTRAVENOUS
  Filled 2018-08-11: qty 100

## 2018-08-11 MED ORDER — NICOTINE 21 MG/24HR TD PT24
21.0000 mg | MEDICATED_PATCH | Freq: Every day | TRANSDERMAL | Status: DC
  Administered 2018-08-11 – 2018-08-14 (×4): 21 mg via TRANSDERMAL
  Filled 2018-08-11 (×4): qty 1

## 2018-08-11 NOTE — Progress Notes (Signed)
  Echocardiogram 2D Echocardiogram has been performed.  Zuzanna Maroney G Tovia Kisner 08/11/2018, 3:44 PM

## 2018-08-11 NOTE — Progress Notes (Signed)
PT Cancellation Note  Patient Details Name: MAIKA KACZMAREK MRN: [id number] DOB: 1960/12/05   Cancelled Treatment:    Reason Eval/Treat Not Completed: Patient not medically ready.  Bedrest post TpA. 08/11/2018  McMillin Bing, PT [facility] [phone]  (pager) [phone]  (office)   Eliseo Gum Danta Baumgardner 08/11/2018, 12:26 PM

## 2018-08-11 NOTE — Consult Note (Addendum)
[facility] Tele[facility]urology Consult Services    Date of Service:   08/11/2018 00:30:53  Impression:      .  RO Acute Ischemic Stroke     .  Right Hemispheric     .  MCA Distribution       Comments: Acute onset left sided weakness, purely motor- arm>leg. r/o right mca. ? hyperdensity on ica terminus on ct head. No absolute contraindications for TPA, discussed with family and patient- who gave verbal consent.  Mechanism of Stroke: Not Clear  Metrics: Last Known Well: 08/11/2018 11:00:00 [facility] Notification Time: 08/11/2018 00:30:53 Arrival Time: 08/11/2018 00:18:00 Stamp Time: 08/11/2018 00:30:53 Time First Login Attempt: 08/11/2018 00:36:00 Video Start Time: 08/11/2018 00:36:00  Symptoms: left sided weakness NIHSS Start Assessment Time: 08/11/2018 00:37:17 tPA Verbal Order Time: 08/11/2018 00:50:12 Patient is a candidate for tPA. tPA CPOE Order Time: 08/11/2018 00:55:29 Needle Time: 08/11/2018 00:56:00 Weight Noted by Staff: 83.9 kg Video End Time: 08/11/2018 01:03:24  CT head was reviewed.  Advanced imaging CTA head and neck obtained. Advanced imaging CTP obtained.  This is deemed to be a candidate for thrombectomy, cta head and neck was ordered  ER physician notified of the decision on thrombolytics management.  Verbal Consent to tPA: I have explained to the Patient and Family the nature of the patient's condition, the use of tPA fibrinolytic agent, and the benefits to be reasonably expected compared with alternative approaches. I have discussed the likelihood of major risks or complications of this procedure including (if applicable) but not limited to loss of limb function, brain damage, paralysis, hemorrhage, infection, complications from transfusion of blood components, drug reactions, blood clots and loss of life. I have also indicated that with any procedure there is always the possibility of an unexpected complication. All questions were answered  and Patient and Family express understanding of the treatment plan and consent to the treatment.  Our recommendations are outlined below.  Recommendations: IV tPA recommended.  tPA bolus given Without Complication.  IV tPA Total Dose - 75.5 mg IV tPA Bolus Dose - 7.6 mg IV tPA Infusion Dose - 68.0 mg  Routine post tPA monitoring including neuro checks and blood pressure control during/after treatment Monitor blood pressure Check blood pressure and NIHSS every 15 min for 2 h, then every 30 min for 6 h, and finally every hour for 16 h.  Manage Blood Pressure per post tPA protocol. Cardene drip for bp > 180/105  Cta head and neck with perfusion pending; Per ED physician, patient will be transferred to [facility] regardless of results. Will follow results.      .  Admission to ICU     .  CT brain 24 hours post tPA     .  NPO until swallowing screen performed and passed     .  No antiplatelet agents or anticoagulants (including heparin for DVT prophylaxis) in first 24 hours     .  No Foley catheter, nasogastric tube, arterial catheter or central venous catheter for 24 hr, unless absolutely necessary     .  Telemetry     .  Bedside swallow evaluation     .  HOB less than 30 degrees     .  Euglycemia     .  Avoid hyperthermia, PRN acetaminophen     .  DVT prophylaxis     .  Inpatient Neurology Consultation     .  Stroke evaluation as per inpatient neurology recommendations  Discussed with ED  physician  Addendum: Negative cta head and neck for lvo No perfusion abnormalitiy Per rad reads  ------------------------------------------------------------------------------  History of Present Illness:  Patient is a 57 years old Male.  Patient was brought by EMS for symptoms of left sided weakness  The patient was brought to the hospital for left sided weakness. The symptoms started at around 1100 last night. He was with his wife when this happened. The patient stood up and went to the  kitchen and when he tried to come back he had complaints of the left leg being numb and weak. Does not take any anticoagulants.  CT head was reviewed: 1. No acute abnormality of the brain identified. 2. Asymmetric mildly increased density in right terminal ICA may reflect thrombus. 3. ASPECTS is 10    Examination: BP(164/100), BG 137; PT 12.3; INR 0.92; platelets 220) 1A: Level of Consciousness - Alert; keenly responsive + 0 1B: Ask Month and Age - Both Questions Right + 0 1C: Blink Eyes & Squeeze Hands - Performs Both Tasks + 0 2: Test Horizontal Extraocular Movements - Normal + 0 3: Test Visual Fields - No Visual Loss + 0 4: Test Facial Palsy (Use Grimace if Obtunded) - Normal symmetry + 0 5A: Test Left Arm Motor Drift - No Movement + 4 5B: Test Right Arm Motor Drift - No Drift for 10 Seconds + 0 6A: Test Left Leg Motor Drift - Some Effort Against Gravity + 2 6B: Test Right Leg Motor Drift - No Drift for 5 Seconds + 0 7: Test Limb Ataxia (FNF/Heel-Shin) - No Ataxia + 0 8: Test Sensation - Normal; No sensory loss + 0 9: Test Language/Aphasia - Normal; No aphasia + 0 10: Test Dysarthria - Mild-Moderate Dysarthria: Slurring but can be understood + 1 11: Test Extinction/Inattention - No abnormality + 0  NIHSS Score: 7  Patient was informed the Neurology Consult would happen via TeleHealth consult by way of interactive audio and video telecommunications and consented to receiving care in this manner.  Due to the immediate potential for life-threatening deterioration due to underlying acute neurologic illness, I spent 35 minutes providing critical care. This time includes time for face to face visit via telemedicine, review of medical records, imaging studies and discussion of findings with providers, the patient and/or family.  Dr Ferd Glassing   [facility] [phone]

## 2018-08-11 NOTE — Progress Notes (Addendum)
STROKE TEAM PROGRESS NOTE  HPI:( Dr Amada Jupiter )  [name] is a 57 y.o. male with a history of hypertension who was in his normal state of health earlier tonight.  He went and laid down getting ready for bed and then when he wanted to get back up he was unable to.  Due to this, EMS activated and he was taken to [facility] where tele-neurology was activated and administered IV TPA LKW: 11 PM on 08/10/18 tpa given?:yes Modified Rankin Scale: 0-   INTERVAL HISTORY His wife and son is at the bedside.  He received tPA at [facility] and transferred here. He now has some movement in his L leg, which is better than before. His blood pressure is better controlled. He is complaining of headache which has not responded to Tylenol. 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   Vitals:   08/11/18 0715 08/11/18 0730 08/11/18 0800 08/11/18 0830  BP: (!) 162/87 (!) 146/98 (!) 169/89 (!) 142/96  Pulse: 93 91 86 86  Resp: 19 (!) 23 20 19   Temp:    97.6 F (36.4 C)  TempSrc:    Oral  SpO2: 99% 97% 98%   Weight:      Height:        CBC:  Recent Labs  Lab 08/11/18 0024 08/11/18 0103 08/11/18 0830  WBC 10.9*  --  10.0  NEUTROABS 8.8*  --   --   HGB 13.6 14.6 14.1  HCT 41.4 43.0 42.8  MCV 93.9  --  92.4  PLT 220  --  256    Basic Metabolic Panel:  Recent Labs  Lab 08/11/18 0024 08/11/18 0103  NA 135 136  K 3.4* 3.7  CL 103 101  CO2 25  --   GLUCOSE 146* 142*  BUN 15 15  CREATININE 0.93 0.80  CALCIUM 8.7*  --    Lipid Panel:     Component Value Date/Time   CHOL 244 (H) 02/15/2014 1630   TRIG 228 (H) 02/15/2014 1630   HDL 30 (L) 02/15/2014 1630   CHOLHDL 8.1 (H) 02/15/2014 1630   LDLCALC 168 (H) 02/15/2014 1630   HgbA1c: No results found for: HGBA1C Urine Drug Screen: No results found for: LABOPIA, COCAINSCRNUR, LABBENZ, AMPHETMU, THCU, LABBARB  Alcohol Level     Component Value Date/Time   ETH <10 08/11/2018 0024     IMAGING Ct Angio Head W Or Wo Contrast  Result Date: 08/11/2018 CLINICAL DATA:  57 y/o  M; acute onset left-sided weakness. EXAM: CT ANGIOGRAPHY HEAD AND NECK CT PERFUSION BRAIN TECHNIQUE: Multidetector CT imaging of the head and neck was performed using the standard protocol during bolus administration of intravenous contrast. Multiplanar CT image reconstructions and MIPs were obtained to evaluate the vascular anatomy. Carotid stenosis measurements (when applicable) are obtained utilizing NASCET criteria, using the distal internal carotid diameter as the denominator. Multiphase CT imaging of the brain was performed following IV bolus contrast injection. Subsequent parametric perfusion maps were calculated using RAPID software. CONTRAST:  ISOVUE-370 IOPAMIDOL (ISOVUE-370) INJECTION 76% COMPARISON:  08/11/2018 CT head. FINDINGS: CTA NECK FINDINGS Aortic arch: Bovine variant branching. Imaged portion shows no evidence of aneurysm or dissection. No significant stenosis of the major arch vessel origins. Right carotid system: No evidence of dissection, stenosis (50% or greater) or occlusion. Non stenotic calcific atherosclerosis of carotid bifurcation. Left carotid system: No evidence of dissection, stenosis (50% or greater) or occlusion. Non stenotic  calcific atherosclerosis of carotid bifurcation. Vertebral arteries: Left dominant vertebral artery. Calcific atherosclerosis of left vertebral artery origin with severe stenosis. Beaded appearance of the left vertebral artery in the neck multiple segments of mild-to-moderate stenosis. Patent normal appearance of the right vertebral artery. Skeleton: Negative. Other neck: Negative. Upper chest: 3 mm right middle fissure perifissural nodule, likely intrapulmonary lymph node. Review of the MIP images confirms the above findings CTA HEAD FINDINGS Anterior circulation: No significant stenosis, proximal occlusion, aneurysm, or vascular malformation. Mixed plaque  of carotid siphons with mild stenosis. Posterior circulation: No significant stenosis, proximal occlusion, aneurysm, or vascular malformation. Mild mid basilar stenosis. Venous sinuses: As permitted by contrast timing, patent. Anatomic variants: Large right A1, large anterior communicating artery, diminutive left A1, normal variant. Fetal left PCA. Small patent right posterior communicating artery. Review of the MIP images confirms the above findings CT Brain Perfusion Findings: CBF (<30%) Volume: 0mL Perfusion (Tmax>6.0s) volume: 0mL Mismatch Volume: 0mL Infarction Location:Negative. IMPRESSION: CTA neck: 1. Patent bilateral carotid systems and right vertebral artery without hemodynamically significant stenosis by NASCET criteria, dissection, or aneurysm. 2. 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 CTA head: 1. No large vessel occlusion, high-grade stenosis, or aneurysm. 2. Mild atherosclerosis of carotid siphons and the basilar artery without significant stenosis. CT brain perfusion: Normal CT brain perfusion. These results were called by telephone at the time of interpretation on 08/11/2018 at 1:36 am to Dr. Preston Fleeting, who verbally acknowledged these results. Electronically Signed   By: Mitzi Hansen M.D.   On: 08/11/2018 01:40   Ct Angio Neck W Or Wo Contrast  Result Date: 08/11/2018 CLINICAL DATA:  57 y/o  M; acute onset left-sided weakness. EXAM: CT ANGIOGRAPHY HEAD AND NECK CT PERFUSION BRAIN TECHNIQUE: Multidetector CT imaging of the head and neck was performed using the standard protocol during bolus administration of intravenous contrast. Multiplanar CT image reconstructions and MIPs were obtained to evaluate the vascular anatomy. Carotid stenosis measurements (when applicable) are obtained utilizing NASCET criteria, using  the distal internal carotid diameter as the denominator. Multiphase CT imaging of the brain was performed following IV bolus contrast injection. Subsequent parametric perfusion maps were calculated using RAPID software. CONTRAST:  ISOVUE-370 IOPAMIDOL (ISOVUE-370) INJECTION 76% COMPARISON:  08/11/2018 CT head. FINDINGS: CTA NECK FINDINGS Aortic arch: Bovine variant branching. Imaged portion shows no evidence of aneurysm or dissection. No significant stenosis of the major arch vessel origins. Right carotid system: No evidence of dissection, stenosis (50% or greater) or occlusion. Non stenotic calcific atherosclerosis of carotid bifurcation. Left carotid system: No evidence of dissection, stenosis (50% or greater) or occlusion. Non stenotic calcific atherosclerosis of carotid bifurcation. Vertebral arteries: Left dominant vertebral artery. Calcific atherosclerosis of left vertebral artery origin with severe stenosis. Beaded appearance of the left vertebral artery in the neck multiple segments of mild-to-moderate stenosis. Patent normal appearance of the right vertebral artery. Skeleton: Negative. Other neck: Negative. Upper chest: 3 mm right middle fissure perifissural nodule, likely intrapulmonary lymph node. Review of the MIP images confirms the above findings CTA HEAD FINDINGS Anterior circulation: No significant stenosis, proximal occlusion, aneurysm, or vascular malformation. Mixed plaque of carotid siphons with mild stenosis. Posterior circulation: No significant stenosis, proximal occlusion, aneurysm, or vascular malformation. Mild mid basilar stenosis. Venous sinuses: As permitted by contrast timing, patent. Anatomic variants: Large right A1, large anterior communicating artery, diminutive left A1, normal variant. Fetal  left PCA. Small patent right posterior communicating artery. Review of the MIP images confirms the above findings CT Brain Perfusion Findings: CBF (<30%) Volume: 0mL Perfusion (Tmax>6.0s)  volume: 0mL Mismatch Volume: 0mL Infarction Location:Negative. IMPRESSION: CTA neck: 1. Patent bilateral carotid systems and right vertebral artery without hemodynamically significant stenosis by NASCET criteria, dissection, or aneurysm. 2. 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 CTA head: 1. No large vessel occlusion, high-grade stenosis, or aneurysm. 2. Mild atherosclerosis of carotid siphons and the basilar artery without significant stenosis. CT brain perfusion: Normal CT brain perfusion. These results were called by telephone at the time of interpretation on 08/11/2018 at 1:36 am to Dr. Preston Fleeting, who verbally acknowledged these results. Electronically Signed   By: Mitzi Hansen M.D.   On: 08/11/2018 01:40   Ct Cerebral Perfusion W Contrast  Result Date: 08/11/2018 CLINICAL DATA:  57 y/o  M; acute onset left-sided weakness. EXAM: CT ANGIOGRAPHY HEAD AND NECK CT PERFUSION BRAIN TECHNIQUE: Multidetector CT imaging of the head and neck was performed using the standard protocol during bolus administration of intravenous contrast. Multiplanar CT image reconstructions and MIPs were obtained to evaluate the vascular anatomy. Carotid stenosis measurements (when applicable) are obtained utilizing NASCET criteria, using the distal internal carotid diameter as the denominator. Multiphase CT imaging of the brain was performed following IV bolus contrast injection. Subsequent parametric perfusion maps were calculated using RAPID software. CONTRAST:  ISOVUE-370 IOPAMIDOL (ISOVUE-370) INJECTION 76% COMPARISON:  08/11/2018 CT head. FINDINGS: CTA NECK FINDINGS Aortic arch: Bovine variant branching. Imaged portion shows no evidence of aneurysm or dissection. No significant stenosis of the major arch vessel origins. Right carotid system: No  evidence of dissection, stenosis (50% or greater) or occlusion. Non stenotic calcific atherosclerosis of carotid bifurcation. Left carotid system: No evidence of dissection, stenosis (50% or greater) or occlusion. Non stenotic calcific atherosclerosis of carotid bifurcation. Vertebral arteries: Left dominant vertebral artery. Calcific atherosclerosis of left vertebral artery origin with severe stenosis. Beaded appearance of the left vertebral artery in the neck multiple segments of mild-to-moderate stenosis. Patent normal appearance of the right vertebral artery. Skeleton: Negative. Other neck: Negative. Upper chest: 3 mm right middle fissure perifissural nodule, likely intrapulmonary lymph node. Review of the MIP images confirms the above findings CTA HEAD FINDINGS Anterior circulation: No significant stenosis, proximal occlusion, aneurysm, or vascular malformation. Mixed plaque of carotid siphons with mild stenosis. Posterior circulation: No significant stenosis, proximal occlusion, aneurysm, or vascular malformation. Mild mid basilar stenosis. Venous sinuses: As permitted by contrast timing, patent. Anatomic variants: Large right A1, large anterior communicating artery, diminutive left A1, normal variant. Fetal left PCA. Small patent right posterior communicating artery. Review of the MIP images confirms the above findings CT Brain Perfusion Findings: CBF (<30%) Volume: 0mL Perfusion (Tmax>6.0s) volume: 0mL Mismatch Volume: 0mL Infarction Location:Negative. IMPRESSION: CTA neck: 1. Patent bilateral carotid systems and right vertebral artery without hemodynamically significant stenosis by NASCET criteria, dissection, or aneurysm. 2. 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 CTA head: 1. No large vessel occlusion, high-grade stenosis, or  aneurysm. 2. Mild atherosclerosis of carotid siphons and the basilar artery without significant stenosis. CT brain perfusion: Normal CT brain perfusion. These results were called by telephone at the time of interpretation on 08/11/2018 at 1:36 am to  Dr. Preston Fleeting, who verbally acknowledged these results. Electronically Signed   By: Mitzi Hansen M.D.   On: 08/11/2018 01:40   Ct Head Code Stroke Wo Contrast  Result Date: 08/11/2018 CLINICAL DATA:  Code stroke.  Sudden onset left-sided weakness. EXAM: CT HEAD WITHOUT CONTRAST TECHNIQUE: Contiguous axial images were obtained from the base of the skull through the vertex without intravenous contrast. COMPARISON:  06/24/2018 CT head FINDINGS: Brain: No evidence of acute infarction, hemorrhage, hydrocephalus, extra-axial collection or mass lesion/mass effect. Vascular: Asymmetric mildly increased density within the right terminal ICA may represent thrombus (series 4, image 27). Skull: Normal. Negative for fracture or focal lesion. Sinuses/Orbits: No acute finding. Other: None. ASPECTS [facility] Stroke Program Early CT Score) - Ganglionic level infarction (caudate, lentiform nuclei, internal capsule, insula, M1-M3 cortex): 7 - Supraganglionic infarction (M4-M6 cortex): 3 Total score (0-10 with 10 being normal): 10 IMPRESSION: 1. No acute abnormality of the brain identified. 2. Asymmetric mildly increased density in right terminal ICA may reflect thrombus. 3. ASPECTS is 10 These results were called by telephone at the time of interpretation on 08/11/2018 at 12:34 am to Dr. Dione Booze , who verbally acknowledged these results. Electronically Signed   By: Mitzi Hansen M.D.   On: 08/11/2018 00:38    PHYSICAL EXAM Pleasant middle aged Caucasian male currently not in distress. . Afebrile. Head is nontraumatic. Neck is supple without bruit.    Cardiac exam no murmur or gallop. Lungs are clear to auscultation. Distal pulses are well felt. Neurological  Exam :  Awake alert oriented x 3 mild dysarthria but no aphasia.. Mild left lower face asymmetry. Tongue midline. Left upper and lower extremity drift.left upper extremity flaccid with 1/5 strength in left lower extremity 3/5 strength. Mild diminished fine finger movements on left. Orbits right over left upper extremity. Mild left grip weak.. Diminished touch pinprick sensation in left lower extremity . Normal coordination.  NIH SS 8 ASSESSMENT/PLAN [name] is a 57 y.o. male with history of HTN and tobacco abuse presenting with L hemiparesis. Received IV tPA 08/11/2017 at 0056.  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 Asymmetry R terminal ICA ? Thrombus. ASPECTS 10.     CTA head no LVO. Mild atherosclerosis   CTA neck extensive atherosclerosis L VA, severe stenosis/near occlusion  CT perfusion normal  MRI  pending   2D Echo  pending   LDL pending   HgbA1c pending   HIV pending   RPR pending   UDS pending   SCDs for VTE prophylaxis Diet Order            Diet NPO time specified  Diet effective now              No antithrombotic prior to admission, now on No antithrombotic as within 24h of tPA administration. Plan to add aspirin following tPA 24h window  Therapy recommendations:  pending   Disposition:  pending   Hypertensive Emergency  BP as high as 203/182  Home meds:  Amlodipine-benezepril 5-10 daily  Stable . BP goal per post tPA guidelines . Resume home meds . Long-term BP goal normotensive  Other Stroke Risk Factors  Cigarette smoker, advised to stop smoking. Smoker 1.5 PPD. Add nicotine patch  Obesity, Body mass index is 31.94 kg/m., recommend weight loss, diet and exercise as appropriate   Other Active Problems  Reflux on prilosec  BPH on flomax  L leg cramps.  Add gabapentin prn  Hospital day # 0  Annie Main, MSN, APRN, ANVP-BC, AGPCNP-BC Advanced Practice  Stroke Nurse [facility] See Amion for Schedule & Pager information 08/11/2018 9:15 AM  I have personally examined this patient, reviewed notes, independently viewed imaging studies, participated in medical decision making and plan of care.ROS completed by me personally and pertinent positives fully documented  I have made any additions or clarifications directly to the above note. Agree with note above.  He presented with left hemiplegia due to right hemispheric infarct and was treated with IV TPA and shown some improvement in left leg strength. Continue close neurological monitoring and strict blood pressure control as per post TPA protocol. Check MRI scan of the brain later today. Patient counseled to quit smoking and is agreeable. Start gabapentin for elective cramps and tylenol#3 for headache. Long discussion with patient, wife and family at the bedside and answered questions. This patient is critically ill and at significant risk of neurological worsening, death and care requires constant monitoring of vital signs, hemodynamics,respiratory and cardiac monitoring, extensive review of multiple databases, frequent neurological assessment, discussion with family, other specialists and medical decision making of high complexity.I have made any additions or clarifications directly to the above note.This critical care time does not reflect procedure time, or teaching time or supervisory time of PA/NP/Med Resident etc but could involve care discussion time.  I spent 40 minutes of neurocritical care time  in the care of  this patient.     Delia Heady, MD Medical Director [facility] Pager: [phone] 08/11/2018 4:20 PM  To contact Stroke Continuity provider, please refer to [email or web address]. After hours, contact General Neurology

## 2018-08-11 NOTE — Progress Notes (Signed)
SLP Cancellation Note  Patient Details Name: SHRIYANS KUENZI MRN: [id number] DOB: Dec 10, 1960   Cancelled treatment:       Reason BSE Not Completed: RN reports pt passed the [employer] screen and po diet has been ordered. Please notify speech therapy if needs arise. Will proceed with cog/com evaluation.  Dao Mearns B. Murvin [facility], CCC-SLP Speech Language Pathologist [phone]  Leigh Aurora 08/11/2018, 10:05 AM

## 2018-08-11 NOTE — ED Notes (Signed)
Pt will not stay still for blood pressure to read accurately.

## 2018-08-11 NOTE — ED Notes (Signed)
Pt getting increasingly anxious and wants to get up to walk around.

## 2018-08-11 NOTE — ED Notes (Signed)
CODE STROKE paged @ [phone]

## 2018-08-11 NOTE — H&P (Signed)
Neurology H&P  CC: Left-sided weakness  History is obtained from: Patient  HPI: [name] is a 57 y.o. male with a history of hypertension who was in his normal state of health earlier tonight.  He went and laid down getting ready for bed and then when he wanted to get back up he was unable to.  Due to this, EMS activated and he was taken to [facility] where tele-neurology was activated and administered IV TPA   LKW: 11 PM tpa given?: Modified Rankin Scale: 0-Completely asymptomatic and back to baseline post- stroke  ROS: A 14 point ROS was performed and is negative except as noted in the HPI.   Past Medical History:  Diagnosis Date  . Acid reflux   . Hypertension   . Kidney stone      Family History  Problem Relation Age of Onset  . Hypertension Father   . Heart disease Father   . Hyperlipidemia Father   . Diabetes Father      Social History:  reports that he has been smoking cigarettes. He has been smoking about 2.00 packs per day. He has never used smokeless tobacco. He reports that he does not drink alcohol or use drugs.   Exam: Current vital signs: BP (!) 168/105   Pulse 76   Temp 98 F (36.7 C)   Resp (!) 23   Ht 5\' 7"  (1.702 m)   Wt 92.5 kg   SpO2 99%   BMI 31.94 kg/m  Vital signs in last 24 hours: Temp:  [97.4 F (36.3 C)-98 F (36.7 C)] 98 F (36.7 C) (10/18 0318) Pulse Rate:  [74-100] 76 (10/18 0318) Resp:  [14-29] 23 (10/18 0400) BP: (159-203)/(90-182) 168/105 (10/18 0415) SpO2:  [95 %-100 %] 99 % (10/18 0400) Weight:  [83.9 kg-92.5 kg] 92.5 kg (10/18 0400)  Physical Exam  Constitutional: Appears well-developed and well-nourished.  Psych: Affect appropriate to situation Eyes: No scleral injection HENT: No OP obstrucion Head: Normocephalic.  Cardiovascular: Normal rate and regular rhythm.  Respiratory: Effort normal and breath sounds normal to anterior ascultation GI: Soft.  No distension. There is no tenderness.  Skin:  WDI  Neuro: Mental Status: Patient is awake, alert, oriented to person, place, month, year, and situation. Patient is able to give a clear and coherent history. No signs of aphasia or neglect Cranial Nerves: II: Visual Fields are full. Pupils are equal, round, and reactive to light.   III,IV, VI: EOMI without ptosis or diploplia.  V: Facial sensation is symmetric to temperature VII: Facial movement is weak on the left VIII: hearing is intact to voice X: Uvula elevates symmetrically XI: Shoulder shrug is symmetric. XII: tongue is midline without atrophy or fasciculations.  Motor: Tone is normal. Bulk is normal. 5/5 strength was present on the right side, on the left side Sensory: Sensation is symmetric to light touch and temperature in the arms and legs. Cerebellar: FNF and HKS are intact bilaterally  I have reviewed labs in epic and the results pertinent to this consultation are: CMP-unremarkable  I have reviewed the images obtained: CTA-no LVO  Primary Diagnosis:  Cerebral infarction due to thrombosis of unspecified vessel   Secondary Diagnosis: Essential (primary) hypertension   Impression: 57 year old male with a history of hypertension and tobacco abuse who presents with pure motor ischemic stroke.  He has received IV TPA.  Recommendations: - HgbA1c, fasting lipid panel - MRI of the brain without contrast - Frequent neuro checks - Echocardiogram - Prophylactic  therapy-none for 24 hours - Risk factor modification - Telemetry monitoring - PT consult, OT consult, Speech consult - Stroke team to follow -Advised smoking cessation -Nicardipine for BP control   This patient is critically ill and at significant risk of neurological worsening, death and care requires constant monitoring of vital signs, hemodynamics,respiratory and cardiac monitoring, neurological assessment, discussion with family, other specialists and medical decision making of high complexity. I spent  35 minutes of neurocritical care time  in the care of  this patient.  Ritta Slot, MD [facility] [phone]  If 7pm- 7am, please page neurology on call as listed in AMION. 08/11/2018  4:55 AM

## 2018-08-11 NOTE — Progress Notes (Signed)
Patient arrived to 4N27 at 0400.

## 2018-08-11 NOTE — ED Triage Notes (Addendum)
Per ems and pt's wife, pt LKW was 2300 when pt had onset of left side weakness and slurring of his speech

## 2018-08-11 NOTE — ED Provider Notes (Signed)
[facility] EMERGENCY DEPARTMENT Provider Note   CSN: [id number] Arrival date & time: 08/11/18  0018     History   Chief Complaint Chief Complaint  Patient presents with  . Code Stroke    HPI [name] is a 57 y.o. male.  The history is provided by the patient and the EMS personnel.  He has history of hypertension and was brought in by EMS as a code stroke.  He is unable to walk because of weakness in his left leg.  Patient states that he was last able to walk normally at 9 PM, EMS reports that his wife stated he was last known normal at 11:30 PM.  He denies any headache.  Past Medical History:  Diagnosis Date  . Acid reflux   . Hypertension   . Kidney stone     Patient Active Problem List   Diagnosis Date Noted  . Hypertension 02/17/2014    Past Surgical History:  Procedure Laterality Date  . CHOLECYSTECTOMY          Home Medications    Prior to Admission medications   Medication Sig Start Date End Date Taking? Authorizing Provider  amLODipine-benazepril (LOTREL) 5-10 MG capsule Take 1 capsule by mouth daily. 05/25/17   [provider]  ketorolac (TORADOL) 10 MG tablet Take 10 mg by mouth every 6 (six) hours. 06/25/17   [provider]  omeprazole (PRILOSEC OTC) 20 MG tablet Take 20-40 mg by mouth daily.     [provider]  ondansetron (ZOFRAN ODT) 4 MG disintegrating tablet Take 1 tablet (4 mg total) by mouth every 8 (eight) hours as needed for nausea or vomiting. 06/26/17   Samuel Jester, DO  oxyCODONE (OXY IR/ROXICODONE) 5 MG immediate release tablet Take 10 mg by mouth every 6 (six) hours as needed. 06/25/17   [provider]  tamsulosin (FLOMAX) 0.4 MG CAPS capsule Take 1 capsule (0.4 mg total) by mouth at bedtime. 06/26/17   Samuel Jester, DO    Family History Family History  Problem Relation Age of Onset  . Hypertension Father   . Heart disease Father   . Hyperlipidemia Father   . Diabetes Father     Social  History Social History   Tobacco Use  . Smoking status: Current Every Day Smoker    Packs/day: 2.00    Types: Cigarettes    Last attempt to quit: 01/15/2012    Years since quitting: 6.5  . Smokeless tobacco: Never Used  Substance Use Topics  . Alcohol use: No    Alcohol/week: 0.0 standard drinks  . Drug use: No     Allergies   Morphine and related and Penicillins   Review of Systems Review of Systems  All other systems reviewed and are negative.    Physical Exam Updated Vital Signs BP (!) 203/182   Pulse 85   Temp 98 F (36.7 C)   Resp 16   Ht 5\' 9"  (1.753 m)   Wt 83.9 kg   SpO2 98%   BMI 27.32 kg/m   Physical Exam  Nursing note and vitals reviewed.  57 year old male, resting comfortably and in no acute distress. Vital signs are significant for elevated blood pressure. Oxygen saturation is 98%, which is normal. Head is normocephalic and atraumatic. PERRLA, EOMI. Oropharynx is clear. Neck is nontender and supple without adenopathy or JVD. Back is nontender and there is no CVA tenderness. Lungs have diffuse expiratory wheezes without rales or rhonchi. Chest is nontender. Heart  has regular rate and rhythm without murmur. Abdomen is soft, flat, nontender without masses or hepatosplenomegaly and peristalsis is normoactive. Extremities have no cyanosis or edema, full range of motion is present. Skin is warm and dry without rash. Neurologic: Awake and alert but with slightly dysarthric speech.  Left central facial droop present.  Left-sided weakness with left arm strength 3/5, left leg strength 2/5.  Normal strength on the right.  ED Treatments / Results  Labs (all labs ordered are listed, but only abnormal results are displayed) Labs Reviewed  CBC - Abnormal; Notable for the following components:      Result Value   WBC 10.9 (*)    All other components within normal limits  DIFFERENTIAL - Abnormal; Notable for the following components:   Neutro Abs 8.8 (*)     All other components within normal limits  COMPREHENSIVE METABOLIC PANEL - Abnormal; Notable for the following components:   Potassium 3.4 (*)    Glucose, Bld 146 (*)    Calcium 8.7 (*)    All other components within normal limits  I-STAT CHEM 8, ED - Abnormal; Notable for the following components:   Glucose, Bld 142 (*)    Calcium, Ion 1.12 (*)    All other components within normal limits  CBG MONITORING, ED - Abnormal; Notable for the following components:   Glucose-Capillary 137 (*)    All other components within normal limits  ETHANOL  PROTIME-INR  APTT  RAPID URINE DRUG SCREEN, HOSP PERFORMED  URINALYSIS, ROUTINE W REFLEX MICROSCOPIC  I-STAT TROPONIN, ED    EKG None  Radiology Ct Angio Head W Or Wo Contrast  Result Date: 08/11/2018 CLINICAL DATA:  57 y/o  M; acute onset left-sided weakness. EXAM: CT ANGIOGRAPHY HEAD AND NECK CT PERFUSION BRAIN TECHNIQUE: Multidetector CT imaging of the head and neck was performed using the standard protocol during bolus administration of intravenous contrast. Multiplanar CT image reconstructions and MIPs were obtained to evaluate the vascular anatomy. Carotid stenosis measurements (when applicable) are obtained utilizing NASCET criteria, using the distal internal carotid diameter as the denominator. Multiphase CT imaging of the brain was performed following IV bolus contrast injection. Subsequent parametric perfusion maps were calculated using RAPID software. CONTRAST:  ISOVUE-370 IOPAMIDOL (ISOVUE-370) INJECTION 76% COMPARISON:  08/11/2018 CT head. FINDINGS: CTA NECK FINDINGS Aortic arch: Bovine variant branching. Imaged portion shows no evidence of aneurysm or dissection. No significant stenosis of the major arch vessel origins. Right carotid system: No evidence of dissection, stenosis (50% or greater) or occlusion. Non stenotic calcific atherosclerosis of carotid bifurcation. Left carotid system: No evidence of dissection, stenosis (50% or  greater) or occlusion. Non stenotic calcific atherosclerosis of carotid bifurcation. Vertebral arteries: Left dominant vertebral artery. Calcific atherosclerosis of left vertebral artery origin with severe stenosis. Beaded appearance of the left vertebral artery in the neck multiple segments of mild-to-moderate stenosis. Patent normal appearance of the right vertebral artery. Skeleton: Negative. Other neck: Negative. Upper chest: 3 mm right middle fissure perifissural nodule, likely intrapulmonary lymph node. Review of the MIP images confirms the above findings CTA HEAD FINDINGS Anterior circulation: No significant stenosis, proximal occlusion, aneurysm, or vascular malformation. Mixed plaque of carotid siphons with mild stenosis. Posterior circulation: No significant stenosis, proximal occlusion, aneurysm, or vascular malformation. Mild mid basilar stenosis. Venous sinuses: As permitted by contrast timing, patent. Anatomic variants: Large right A1, large anterior communicating artery, diminutive left A1, normal variant. Fetal left PCA. Small patent right posterior communicating artery. Review of the MIP images  confirms the above findings CT Brain Perfusion Findings: CBF (<30%) Volume: 0mL Perfusion (Tmax>6.0s) volume: 0mL Mismatch Volume: 0mL Infarction Location:Negative. IMPRESSION: CTA neck: 1. Patent bilateral carotid systems and right vertebral artery without hemodynamically significant stenosis by NASCET criteria, dissection, or aneurysm. 2. 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 CTA head: 1. No large vessel occlusion, high-grade stenosis, or aneurysm. 2. Mild atherosclerosis of carotid siphons and the basilar artery without significant stenosis. CT brain perfusion: Normal CT brain perfusion. These results were called by telephone at the  time of interpretation on 08/11/2018 at 1:36 am to Dr. Preston Fleeting, who verbally acknowledged these results. Electronically Signed   By: Mitzi Hansen M.D.   On: 08/11/2018 01:40   Ct Angio Neck W Or Wo Contrast  Result Date: 08/11/2018 CLINICAL DATA:  57 y/o  M; acute onset left-sided weakness. EXAM: CT ANGIOGRAPHY HEAD AND NECK CT PERFUSION BRAIN TECHNIQUE: Multidetector CT imaging of the head and neck was performed using the standard protocol during bolus administration of intravenous contrast. Multiplanar CT image reconstructions and MIPs were obtained to evaluate the vascular anatomy. Carotid stenosis measurements (when applicable) are obtained utilizing NASCET criteria, using the distal internal carotid diameter as the denominator. Multiphase CT imaging of the brain was performed following IV bolus contrast injection. Subsequent parametric perfusion maps were calculated using RAPID software. CONTRAST:  ISOVUE-370 IOPAMIDOL (ISOVUE-370) INJECTION 76% COMPARISON:  08/11/2018 CT head. FINDINGS: CTA NECK FINDINGS Aortic arch: Bovine variant branching. Imaged portion shows no evidence of aneurysm or dissection. No significant stenosis of the major arch vessel origins. Right carotid system: No evidence of dissection, stenosis (50% or greater) or occlusion. Non stenotic calcific atherosclerosis of carotid bifurcation. Left carotid system: No evidence of dissection, stenosis (50% or greater) or occlusion. Non stenotic calcific atherosclerosis of carotid bifurcation. Vertebral arteries: Left dominant vertebral artery. Calcific atherosclerosis of left vertebral artery origin with severe stenosis. Beaded appearance of the left vertebral artery in the neck multiple segments of mild-to-moderate stenosis. Patent normal appearance of the right vertebral artery. Skeleton: Negative. Other neck: Negative. Upper chest: 3 mm right middle fissure perifissural nodule, likely intrapulmonary lymph node. Review of the  MIP images confirms the above findings CTA HEAD FINDINGS Anterior circulation: No significant stenosis, proximal occlusion, aneurysm, or vascular malformation. Mixed plaque of carotid siphons with mild stenosis. Posterior circulation: No significant stenosis, proximal occlusion, aneurysm, or vascular malformation. Mild mid basilar stenosis. Venous sinuses: As permitted by contrast timing, patent. Anatomic variants: Large right A1, large anterior communicating artery, diminutive left A1, normal variant. Fetal left PCA. Small patent right posterior communicating artery. Review of the MIP images confirms the above findings CT Brain Perfusion Findings: CBF (<30%) Volume: 0mL Perfusion (Tmax>6.0s) volume: 0mL Mismatch Volume: 0mL Infarction Location:Negative. IMPRESSION: CTA neck: 1. Patent bilateral carotid systems and right vertebral artery without hemodynamically significant stenosis by NASCET criteria, dissection, or aneurysm. 2. 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 CTA head: 1. No large vessel occlusion, high-grade stenosis, or aneurysm. 2. Mild atherosclerosis of carotid siphons and the basilar artery without significant stenosis. CT brain perfusion: Normal CT brain perfusion. These results were called by telephone at the time of interpretation on 08/11/2018 at 1:36 am to Dr. Preston Fleeting, who verbally acknowledged these results. Electronically Signed   By:  Mitzi Hansen M.D.   On: 08/11/2018 01:40   Ct Cerebral Perfusion W Contrast  Result Date: 08/11/2018 CLINICAL DATA:  57 y/o  M; acute onset left-sided weakness. EXAM: CT ANGIOGRAPHY HEAD AND NECK CT PERFUSION BRAIN TECHNIQUE: Multidetector CT imaging of the head and neck was performed using the standard protocol during bolus administration of intravenous contrast. Multiplanar  CT image reconstructions and MIPs were obtained to evaluate the vascular anatomy. Carotid stenosis measurements (when applicable) are obtained utilizing NASCET criteria, using the distal internal carotid diameter as the denominator. Multiphase CT imaging of the brain was performed following IV bolus contrast injection. Subsequent parametric perfusion maps were calculated using RAPID software. CONTRAST:  ISOVUE-370 IOPAMIDOL (ISOVUE-370) INJECTION 76% COMPARISON:  08/11/2018 CT head. FINDINGS: CTA NECK FINDINGS Aortic arch: Bovine variant branching. Imaged portion shows no evidence of aneurysm or dissection. No significant stenosis of the major arch vessel origins. Right carotid system: No evidence of dissection, stenosis (50% or greater) or occlusion. Non stenotic calcific atherosclerosis of carotid bifurcation. Left carotid system: No evidence of dissection, stenosis (50% or greater) or occlusion. Non stenotic calcific atherosclerosis of carotid bifurcation. Vertebral arteries: Left dominant vertebral artery. Calcific atherosclerosis of left vertebral artery origin with severe stenosis. Beaded appearance of the left vertebral artery in the neck multiple segments of mild-to-moderate stenosis. Patent normal appearance of the right vertebral artery. Skeleton: Negative. Other neck: Negative. Upper chest: 3 mm right middle fissure perifissural nodule, likely intrapulmonary lymph node. Review of the MIP images confirms the above findings CTA HEAD FINDINGS Anterior circulation: No significant stenosis, proximal occlusion, aneurysm, or vascular malformation. Mixed plaque of carotid siphons with mild stenosis. Posterior circulation: No significant stenosis, proximal occlusion, aneurysm, or vascular malformation. Mild mid basilar stenosis. Venous sinuses: As permitted by contrast timing, patent. Anatomic variants: Large right A1, large anterior communicating artery, diminutive left A1, normal variant. Fetal left PCA.  Small patent right posterior communicating artery. Review of the MIP images confirms the above findings CT Brain Perfusion Findings: CBF (<30%) Volume: 0mL Perfusion (Tmax>6.0s) volume: 0mL Mismatch Volume: 0mL Infarction Location:Negative. IMPRESSION: CTA neck: 1. Patent bilateral carotid systems and right vertebral artery without hemodynamically significant stenosis by NASCET criteria, dissection, or aneurysm. 2. 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 CTA head: 1. No large vessel occlusion, high-grade stenosis, or aneurysm. 2. Mild atherosclerosis of carotid siphons and the basilar artery without significant stenosis. CT brain perfusion: Normal CT brain perfusion. These results were called by telephone at the time of interpretation on 08/11/2018 at 1:36 am to Dr. Preston Fleeting, who verbally acknowledged these results. Electronically Signed   By: Mitzi Hansen M.D.   On: 08/11/2018 01:40   Ct Head Code Stroke Wo Contrast  Result Date: 08/11/2018 CLINICAL DATA:  Code stroke.  Sudden onset left-sided weakness. EXAM: CT HEAD WITHOUT CONTRAST TECHNIQUE: Contiguous axial images were obtained from the base of the skull through the vertex without intravenous contrast. COMPARISON:  06/24/2018 CT head FINDINGS: Brain: No evidence of acute infarction, hemorrhage, hydrocephalus, extra-axial collection or mass lesion/mass effect. Vascular: Asymmetric mildly increased density within the right terminal ICA may represent thrombus (series 4, image 27). Skull: Normal. Negative for fracture or focal lesion. Sinuses/Orbits: No acute finding. Other: None. ASPECTS [facility] Stroke Program Early CT Score) - Ganglionic level infarction (caudate, lentiform nuclei, internal capsule, insula, M1-M3 cortex): 7 - Supraganglionic infarction (M4-M6 cortex): 3 Total  score (0-10 with 10 being normal): 10 IMPRESSION: 1. No acute abnormality of the brain identified. 2. Asymmetric mildly increased density in right terminal ICA may reflect thrombus. 3. ASPECTS is 10 These results were called by telephone at the time of interpretation on 08/11/2018 at 12:34 am to Dr. Dione Booze , who verbally acknowledged these results. Electronically Signed   By: Mitzi Hansen M.D.   On: 08/11/2018 00:38    Procedures Procedures  CRITICAL CARE Performed by: Dione Booze Total critical care time: 100 minutes Critical care time was exclusive of separately billable procedures and treating other patients. Critical care was necessary to treat or prevent imminent or life-threatening deterioration. Critical care was time spent personally by me on the following activities: development of treatment plan with patient and/or surrogate as well as nursing, discussions with consultants, evaluation of patient's response to treatment, examination of patient, obtaining history from patient or surrogate, ordering and performing treatments and interventions, ordering and review of laboratory studies, ordering and review of radiographic studies, pulse oximetry and re-evaluation of patient's condition.  Medications Ordered in ED Medications  nicardipine (CARDENE) 20mg  in 0.86% saline IV infusion (0.1 mg/ml) (5 mg/hr Intravenous New Bag/Given 08/11/18 0220)  alteplase (ACTIVASE) 1 mg/mL injection (100 mg  Given 08/11/18 0057)  alteplase (ACTIVASE) 1 mg/mL infusion 75.5 mg (0 mg/kg  83.9 kg Intravenous Stopped 08/11/18 0156)    Followed by  0.9 %  sodium chloride infusion (0 mLs Intravenous Stopped 08/11/18 0203)  labetalol (NORMODYNE,TRANDATE) 5 MG/ML injection (20 mg  Given 08/11/18 0101)  iopamidol (ISOVUE-370) 76 % injection 100 mL (100 mLs Intravenous Contrast Given 08/11/18 0105)     Initial Impression / Assessment and Plan / ED Course  I have reviewed the triage vital  signs and the nursing notes.  Pertinent labs & imaging results that were available during my care of the patient were reviewed by me and considered in my medical decision making (see chart for details).  Right hemisphere stroke with left hemiparesis.  He is sent for emergent CT of head.  Need to clarify with his wife went the last known normal time was.  Old records are reviewed, and he has no relevant past visits.  His wife has arrived.  After more detailed questioning, last known normal time is 11PM.  She states that he was ambulatory at that time.  He is clearly within the timeframe where thrombolytics would be indicated.  Tele-neurology consult is placed.  CT is negative for bleed.  Tele-neurology has seen the patient and feels he is a candidate for thrombolytic therapy and TPA is administered.  He did require nicardipine for blood pressure control.  He is sent back for CT angiogram of head and neck which shows no acute occlusions.  Patient is reexamined, and facial droop has resolved.  Left arm is still weak, but strength is up to 4/5.  Left leg strength is improved to 4/5.  Case is discussed with Dr. Amada Jupiter, neuro hospitalist at [facility] who agrees to accept the patient in transfer.  Final Clinical Impressions(s) / ED Diagnoses   Final diagnoses:  Cerebrovascular accident (CVA), unspecified mechanism (HCC)  Elevated blood pressure reading with diagnosis of hypertension  Wheezing    ED Discharge Orders    None       Dione Booze, MD 08/11/18 [phone]

## 2018-08-11 NOTE — ED Notes (Signed)
Report given to [facility], approx 20 mins away

## 2018-08-11 NOTE — ED Notes (Signed)
Pt left via [facility] for [facility]

## 2018-08-11 NOTE — Progress Notes (Signed)
Code stroke  Call time  1158   EMS in route Beeper time  1216 Exam started   1222 Exam ended  1226 SOC   1226 Epic   1226 RAD called  1228

## 2018-08-11 NOTE — Evaluation (Signed)
Speech Language Pathology Evaluation Patient Details Name: [name] MRN: [id number] DOB: 1961/02/07 Today's Date: 08/11/2018 Time: 1015-1040 SLP Time Calculation (min) (ACUTE ONLY): 25 min  Problem List:  Patient Active Problem List   Diagnosis Date Noted  . Stroke (cerebrum) (HCC) 08/11/2018  . Hypertension 02/17/2014   Past Medical History:  Past Medical History:  Diagnosis Date  . Acid reflux   . Hypertension   . Kidney stone    Past Surgical History:  Past Surgical History:  Procedure Laterality Date  . CHOLECYSTECTOMY     HPI:  57 year old male admitted 08/11/18 with left sided weakness. TPA given. PMH: GERD, HTN, kidney stones, + tobacco use. CT = no acute abnormality. MRI pending.   Assessment / Plan / Recommendation Clinical Impression  The Montreal Cognitive Assessment (MoCA) was administered. Pt scored 17/30 (n=26+/30), indicating moderate cognitive impairment. Points were lost on executive function, immediate and delayed recall, thought organization, abstract reasoning, visuoperception, and selective attention. Pt was able to name his birth month (September), and the days of the week, but was unable to tell me the months of the year (pt responded with "the ____th month" to questions about when various holidays occur). Oral motor examination reveals slight lingual deviation to the left, as well as left facial weakness. Speech is fully intelligible, however. Pt reports having an 8th grade education, and not being able to read or write. He also indicates decreased near vision, which was present prior to this admission. MRI is pending. Pt reports his wife manages the finances and medications, does all the shopping cooking and cleaning at home. She does not work per pt, and is available 24/7. At this point, it is uncertain how far pt is from his baseline level of functioning. Further ST needs can be addressed at next level of care if needs arise.     SLP Assessment  SLP  Recommendation/Assessment: All further Speech Language Pathology needs can be addressed in the next venue of care  SLP Visit Diagnosis: Cognitive communication deficit (R41.841)    Follow Up Recommendations  (f/u at next venue if needs arise.)       SLP Evaluation Cognition  Overall Cognitive Status: No family/caregiver present to determine baseline cognitive functioning Arousal/Alertness: Awake/alert Orientation Level: Oriented X4 Attention: Focused;Sustained;Selective Focused Attention: Appears intact Sustained Attention: Appears intact Selective Attention: Impaired Selective Attention Impairment: Verbal basic Memory: Impaired Memory Impairment: Retrieval deficit;Decreased short term memory Decreased Short Term Memory: Verbal basic       Comprehension  Auditory Comprehension Overall Auditory Comprehension: Appears within functional limits for tasks assessed Reading Comprehension Reading Status: (Pt reports unable to read)    Expression Expression Primary Mode of Expression: Verbal Verbal Expression Overall Verbal Expression: Appears within functional limits for tasks assessed Written Expression Dominant Hand: Right Written Expression: (Pt reports unable to write)   Oral / Motor  Oral Motor/Sensory Function Overall Oral Motor/Sensory Function: Mild impairment Facial ROM: Reduced left Facial Symmetry: Abnormal symmetry left Facial Strength: Within Functional Limits Facial Sensation: Within Functional Limits Lingual ROM: Reduced left Lingual Symmetry: Abnormal symmetry left Lingual Strength: Within Functional Limits Lingual Sensation: Within Functional Limits Mandible: Within Functional Limits Motor Speech Overall Motor Speech: Appears within functional limits for tasks assessed Intelligibility: Intelligible   GO                   Celedonio Sortino B. Murvin Natal [facility], CCC-SLP Speech Language Pathologist [phone]  Leigh Aurora 08/11/2018, 10:49 AM

## 2018-08-12 DIAGNOSIS — F1210 Cannabis abuse, uncomplicated: Secondary | ICD-10-CM

## 2018-08-12 DIAGNOSIS — F17200 Nicotine dependence, unspecified, uncomplicated: Secondary | ICD-10-CM

## 2018-08-12 DIAGNOSIS — F1410 Cocaine abuse, uncomplicated: Secondary | ICD-10-CM

## 2018-08-12 DIAGNOSIS — E785 Hyperlipidemia, unspecified: Secondary | ICD-10-CM

## 2018-08-12 LAB — CBC
HCT: 40.8 % (ref 39.0–52.0)
Hemoglobin: 12.9 g/dL — ABNORMAL LOW (ref 13.0–17.0)
MCH: 29.9 pg (ref 26.0–34.0)
MCHC: 31.6 g/dL (ref 30.0–36.0)
MCV: 94.4 fL (ref 80.0–100.0)
Platelets: 225 10*3/uL (ref 150–400)
RBC: 4.32 MIL/uL (ref 4.22–5.81)
RDW: 13.9 % (ref 11.5–15.5)
WBC: 6.5 10*3/uL (ref 4.0–10.5)
nRBC: 0 % (ref 0.0–0.2)

## 2018-08-12 LAB — BASIC METABOLIC PANEL
Anion gap: 6 (ref 5–15)
BUN: 16 mg/dL (ref 6–20)
CO2: 26 mmol/L (ref 22–32)
Calcium: 8.9 mg/dL (ref 8.9–10.3)
Chloride: 105 mmol/L (ref 98–111)
Creatinine, Ser: 1.07 mg/dL (ref 0.61–1.24)
GFR calc Af Amer: >60 mL/min (ref >60)
GFR calc non Af Amer: >60 mL/min (ref >60)
Glucose, Bld: 100 mg/dL — ABNORMAL HIGH (ref 70–99)
Potassium: 3.9 mmol/L (ref 3.5–5.1)
Sodium: 137 mmol/L (ref 135–145)

## 2018-08-12 LAB — RAPID URINE DRUG SCREEN, HOSP PERFORMED
Amphetamines: NOT DETECTED
Barbiturates: NOT DETECTED
Benzodiazepines: NOT DETECTED
Cocaine: POSITIVE — AB
Opiates: POSITIVE — AB
Tetrahydrocannabinol: POSITIVE — AB

## 2018-08-12 MED ORDER — NON FORMULARY: 3.0000 mg | Freq: Every evening | Status: DC | PRN

## 2018-08-12 MED ORDER — MELATONIN 3 MG PO TABS
3.0000 mg | ORAL_TABLET | Freq: Every evening | ORAL | Status: DC | PRN
  Administered 2018-08-12: 3 mg via ORAL
  Filled 2018-08-12 (×2): qty 1

## 2018-08-12 MED ORDER — ATORVASTATIN CALCIUM 40 MG PO TABS
40.0000 mg | ORAL_TABLET | Freq: Every day | ORAL | Status: DC
  Administered 2018-08-12 – 2018-08-14 (×3): 40 mg via ORAL
  Filled 2018-08-12 (×3): qty 1

## 2018-08-12 MED ORDER — ENOXAPARIN SODIUM 40 MG/0.4ML ~~LOC~~ SOLN
40.0000 mg | SUBCUTANEOUS | Status: DC
  Administered 2018-08-12 – 2018-08-14 (×3): 40 mg via SUBCUTANEOUS
  Filled 2018-08-12 (×3): qty 0.4

## 2018-08-12 MED ORDER — LABETALOL HCL 5 MG/ML IV SOLN: 10.0000 mg | INTRAVENOUS | Status: DC | PRN

## 2018-08-12 NOTE — Progress Notes (Signed)
   Rehab Admissions Coordinator Note:  Patient was screened by Clois Dupes for appropriateness for an Inpatient Acute Rehab Consult.  At this time, we are recommending Inpatient Rehab consult.  Clois Dupes 08/12/2018, 11:33 AM  I can be reached at [phone].

## 2018-08-12 NOTE — Evaluation (Addendum)
Physical Therapy Evaluation Patient Details Name: [name] MRN: [id number] DOB: [birth year] Today's Date: 08/12/2018   History of Present Illness  57 year old male admitted 08/11/18 with left sided weakness. TPA given. PMH: GERD, HTN, kidney stones, + tobacco use. 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  Clinical Impression  Orders received for PT evaluation. Patient demonstrates deficits in functional mobility as indicated below. Will benefit from continued skilled PT to address deficits and maximize function. Will see as indicated and progress as tolerated.  Prior to admission patient was independent with activity. Currently, patient requires hands on physical assist for very basic and limited function. Patient with noted left UE and LE deficits. At this time, feel patient would benefit from comprehensive inpatient therapies. Recommend CIR consult.     Follow Up Recommendations CIR    Equipment Recommendations  (TBD)    Recommendations for Other Services Rehab consult     Precautions / Restrictions Precautions Precautions: Fall Restrictions Weight Bearing Restrictions: No      Mobility  Bed Mobility Overal bed mobility: Needs Assistance Bed Mobility: Supine to Sit     Supine to sit: Supervision     General bed mobility comments: able to come to EOB with increased time and effort, supervision for safety  Transfers Overall transfer level: Needs assistance Equipment used: 1 person hand held assist Transfers: Sit to/from Stand Sit to Stand: Min assist         General transfer comment: min assist to power to standing, noted LLE instability   Ambulation/Gait Ambulation/Gait assistance: Mod assist Gait Distance (Feet): 12 Feet Assistive device: 1 person hand held assist Gait Pattern/deviations: Step-to pattern;Decreased stride length;Decreased weight shift to left;Shuffle;Staggering left Gait velocity:  decreased Gait velocity interpretation: <1.31 ft/sec, indicative of household ambulator General Gait Details: patient with noted LLE stability and coordination deficits, relaince on UE support and external assist. Ambulated to the bathroom using HHA and wall then to chair. LLE fatigues quickly  Stairs            Wheelchair Mobility    Modified Rankin (Stroke Patients Only) Modified Rankin (Stroke Patients Only) Pre-Morbid Rankin Score: No symptoms Modified Rankin: Moderately severe disability     Balance Overall balance assessment: Needs assistance Sitting-balance support: Feet supported Sitting balance-Leahy Scale: Fair     Standing balance support: Single extremity supported Standing balance-Leahy Scale: Poor Standing balance comment: reliance on external support                             Pertinent Vitals/Pain Pain Assessment: 0-10 Pain Score: 6  Pain Location: head Pain Descriptors / Indicators: Headache Pain Intervention(s): Monitored during session;Patient requesting pain meds-RN notified    Home Living Family/patient expects to be discharged to:: Private residence Living Arrangements: Spouse/significant other Available Help at Discharge: Family;Available 24 hours/day Type of Home: Mobile home Home Access: Stairs to enter Entrance Stairs-Rails: None Entrance Stairs-Number of Steps: 3 Home Layout: One level Home Equipment: None      Prior Function Level of Independence: Independent               Hand Dominance   Dominant Hand: Right    Extremity/Trunk Assessment   Upper Extremity Assessment Upper Extremity Assessment: LUE deficits/detail LUE Coordination: decreased fine motor    Lower Extremity Assessment Lower Extremity Assessment: LLE deficits/detail LLE Deficits / Details: assymetrical weakness noted in Left LE. Functional  3+/5  but fatigues easily and gives way LLE Coordination: decreased fine motor;decreased gross motor        Communication   Communication: No difficulties  Cognition Arousal/Alertness: Awake/alert Behavior During Therapy: WFL for tasks assessed/performed Overall Cognitive Status: Within Functional Limits for tasks assessed                                        General Comments      Exercises     Assessment/Plan    PT Assessment Patient needs continued PT services  PT Problem List Decreased strength;Decreased activity tolerance;Decreased balance;Decreased mobility;Decreased coordination;Pain       PT Treatment Interventions DME instruction;Stair training;Gait training;Functional mobility training;Therapeutic activities;Therapeutic exercise;Balance training;Neuromuscular re-education;Patient/family education    PT Goals (Current goals can be found in the Care Plan section)  Acute Rehab PT Goals Patient Stated Goal: to get better PT Goal Formulation: With patient/family Time For Goal Achievement: 08/26/18 Potential to Achieve Goals: Good    Frequency Min 4X/week   Barriers to discharge Inaccessible home environment      Co-evaluation               AM-PAC PT "6 Clicks" Daily Activity  Outcome Measure Difficulty turning over in bed (including adjusting bedclothes, sheets and blankets)?: A Little Difficulty moving from lying on back to sitting on the side of the bed? : A Little Difficulty sitting down on and standing up from a chair with arms (e.g., wheelchair, bedside commode, etc,.)?: Unable Help needed moving to and from a bed to chair (including a wheelchair)?: A Little Help needed walking in hospital room?: A Lot Help needed climbing 3-5 steps with a railing? : A Lot 6 Click Score: 14    End of Session Equipment Utilized During Treatment: Gait belt;Oxygen Activity Tolerance: Patient tolerated treatment well Patient left: in chair;with call bell/phone within reach;with family/visitor present Nurse Communication: Mobility status PT Visit  Diagnosis: Unsteadiness on feet (R26.81);Other symptoms and signs involving the nervous system (R29.898)    Time: 4098-1191 PT Time Calculation (min) (ACUTE ONLY): 24 min   Charges:   PT Evaluation $PT Eval Moderate Complexity: 1 Mod PT Treatments $Therapeutic Activity: 8-22 mins        Charlotte Crumb, PT DPT  Board Certified Neurologic Specialist Acute Rehabilitation Services Pager [phone] Office [phone]   [name] 08/12/2018, 9:28 AM

## 2018-08-12 NOTE — Progress Notes (Signed)
STROKE TEAM PROGRESS NOTE   INTERVAL HISTORY His wife is at the bedside.  Patient sitting in chair, chest worked with PT.  Recommend CIR. still has left hemiparesis, arm more than leg.  UDS showed positive for cocaine, THC.  Patient is waiting for quit smoking.  Vitals:   08/12/18 0400 08/12/18 0500 08/12/18 0600 08/12/18 0700  BP: 133/81 (!) 115/58 134/74 (!) 143/90  Pulse: 79 83 71 92  Resp: 17 16 18 18   Temp: 98.1 F (36.7 C)     TempSrc: Oral     SpO2: 96% 97% 98% 94%  Weight:      Height:        CBC:  Recent Labs  Lab 08/11/18 0024  08/11/18 0830 08/12/18 0607  WBC 10.9*  --  10.0 6.5  NEUTROABS 8.8*  --   --   --   HGB 13.6   < > 14.1 12.9*  HCT 41.4   < > 42.8 40.8  MCV 93.9  --  92.4 94.4  PLT 220  --  256 225   < > = values in this interval not displayed.    Basic Metabolic Panel:  Recent Labs  Lab 08/11/18 0024 08/11/18 0103 08/12/18 0607  NA 135 136 137  K 3.4* 3.7 3.9  CL 103 101 105  CO2 25  --  26  GLUCOSE 146* 142* 100*  BUN 15 15 16   CREATININE 0.93 0.80 1.07  CALCIUM 8.7*  --  8.9   Lipid Panel:     Component Value Date/Time   CHOL 225 (H) 08/11/2018 0831   CHOL 244 (H) 02/15/2014 1630   TRIG 200 (H) 08/11/2018 0831   HDL 33 (L) 08/11/2018 0831   HDL 30 (L) 02/15/2014 1630   CHOLHDL 6.8 08/11/2018 0831   VLDL 40 08/11/2018 0831   LDLCALC 152 (H) 08/11/2018 0831   LDLCALC 168 (H) 02/15/2014 1630   HgbA1c:  Lab Results  Component Value Date   HGBA1C 5.6 08/11/2018   Urine Drug Screen:     Component Value Date/Time   LABOPIA POSITIVE (A) 08/12/2018 0201   COCAINSCRNUR POSITIVE (A) 08/12/2018 0201   LABBENZ NONE DETECTED 08/12/2018 0201   AMPHETMU NONE DETECTED 08/12/2018 0201   THCU POSITIVE (A) 08/12/2018 0201   LABBARB NONE DETECTED 08/12/2018 0201    Alcohol Level     Component Value Date/Time   ETH <10 08/11/2018 0024    IMAGING Ct Angio Head W Or Wo Contrast  Result Date: 08/11/2018 CLINICAL DATA:  57 y/o  M;  acute onset left-sided weakness. EXAM: CT ANGIOGRAPHY HEAD AND NECK CT PERFUSION BRAIN TECHNIQUE: Multidetector CT imaging of the head and neck was performed using the standard protocol during bolus administration of intravenous contrast. Multiplanar CT image reconstructions and MIPs were obtained to evaluate the vascular anatomy. Carotid stenosis measurements (when applicable) are obtained utilizing NASCET criteria, using the distal internal carotid diameter as the denominator. Multiphase CT imaging of the brain was performed following IV bolus contrast injection. Subsequent parametric perfusion maps were calculated using RAPID software. CONTRAST:  ISOVUE-370 IOPAMIDOL (ISOVUE-370) INJECTION 76% COMPARISON:  08/11/2018 CT head. FINDINGS: CTA NECK FINDINGS Aortic arch: Bovine variant branching. Imaged portion shows no evidence of aneurysm or dissection. No significant stenosis of the major arch vessel origins. Right carotid system: No evidence of dissection, stenosis (50% or greater) or occlusion. Non stenotic calcific atherosclerosis of carotid bifurcation. Left carotid system: No evidence of dissection, stenosis (50% or greater) or occlusion. Non stenotic  calcific atherosclerosis of carotid bifurcation. Vertebral arteries: Left dominant vertebral artery. Calcific atherosclerosis of left vertebral artery origin with severe stenosis. Beaded appearance of the left vertebral artery in the neck multiple segments of mild-to-moderate stenosis. Patent normal appearance of the right vertebral artery. Skeleton: Negative. Other neck: Negative. Upper chest: 3 mm right middle fissure perifissural nodule, likely intrapulmonary lymph node. Review of the MIP images confirms the above findings CTA HEAD FINDINGS Anterior circulation: No significant stenosis, proximal occlusion, aneurysm, or vascular malformation. Mixed plaque of carotid siphons with mild stenosis. Posterior circulation: No significant stenosis, proximal  occlusion, aneurysm, or vascular malformation. Mild mid basilar stenosis. Venous sinuses: As permitted by contrast timing, patent. Anatomic variants: Large right A1, large anterior communicating artery, diminutive left A1, normal variant. Fetal left PCA. Small patent right posterior communicating artery. Review of the MIP images confirms the above findings CT Brain Perfusion Findings: CBF (<30ReneAlbKentuckyaniaVolume: 0mL PerfusionReneAReneAReneAlbKentuckyaniaentuckyReneAlbKentuckyaniaa87menUpper Cumberl41manThe E60mndEast Ms State36 HospiArvid Rightf 19FRuther58[facility] Health RehabilReneAlbKentuckyaniation [facility] H59meaMinnesota Endoscopy ReneAlbKentuckyaniaenter Arvid Rightirh69mulMont35merCypress Creek Outpatient [facility]54enter ArvReneAlbKeReneAlbKentuckyaniaReneAlbKentuckyaniaaniaRightR65menP[place] General4 HoReneAlbKentuckyaniaArvid Rightae523m2mSan [place]35m AHutchinson Ambulatory Surgery C26enter Arvid Righte Co7Ruther51ReneAlbKentuckyaniaistus [facility] Medic21malSelect SpecReneAReneAlbKentuckyaniaeReneAlbKentuckyaniackyaReneAlbKentuckyReneAlbKentuckyaniaaeneAlbKentuckyania Hospital 15m-2[facility]21 GroupArvid Right 3InfRuther63Plainfield Surgery CenteThurmon FairhultzarykKentuckya osis throughout the neck. CTA head: 1. No large vessel occlusion, high-grade stenosis, or aneurysm. 2. Mild atherosclerosis of carotid siphons and the basilar artery without significant stenosis. CT brain perfusion: Normal CT brain perfusion. These results were called by telephone at the time of interpretation on 08/11/2018 at 1:36 am to Dr. Glick, who verbally acknowledged these results. Electronically Signed   By: Lance  Furusawa-Stratton M.D.   On: 08/11/2018 01:40   Ct Angio Neck W Or Wo Contrast  Result Date: 08/11/2018 CLINICAL DATA:  57 y/o  M; acute onset left-sided weakness. EXAM: CT ANGIOGRAPHY HEAD AND NECK CT PERFUSION BRAIN TECHNIQUE: Multidetector CT imaging of the head and neck was performed using the standard protocol during bolus administration of intravenous contrast. Multiplanar CT image reconstructions and MIPs were obtained to evaluate the vascular anatomy. Carotid stenosis measurements (when applicable) are obtained utilizing NASCET criteria, using the distal internal carotid diameter as the denominator. Multiphase CT imaging of the brain was  performed following IV bolus contrast injection. Subsequent parametric perfusion maps were calculated using RAPID software. CONTRAST:  <MEASUREMRudie37.<MEASUREMENRudie37.Me<MEASUREMENRudie37.[facility]<MEASUREMENRudi<MEASUREMENRudie37.[place]<MEASUREMENRudie37.Coral<MEASUREMEN<MEASUREMENRudie37.[facility]<MEASUREMENRudie37.[facility] [facility]<MEASUREMENRudie37.Diges<MEASUREMENRudie37.[facility]<MEASUREMENRudie37<MEASUREMENRudie37.<MEASUREMENR<MEASUREMENRudie37.Adventi16109nRoboAge.beISOVUE-[email or web address]) INJECTION 76% COMPARISON:  08/11/2018 CT head. FINDINGS: CTA NECK FINDINGS Aortic arch: Bovine variant branching. Imaged portion shows no evidence of aneurysm or dissection. No significant stenosis of the major arch vessel origins. Right carotid system: No evidence of dissection, stenosis (50% or greater) or occlusion. Non stenotic calcific atherosclerosis of carotid bifurcation. Left carotid system: No evidence of dissection, stenosis (50% or greater) or occlusion. Non stenotic calcific atherosclerosis of carotid bifurcation. Vertebral arteries: Left dominant vertebral artery. Calcific atherosclerosis of left vertebral artery origin with severe stenosis. Beaded appearance of the left vertebral artery in the neck multiple segments of mild-to-moderate stenosis. Patent normal appearance of the right vertebral artery. Skeleton: Negative. Other neck: Negative. Upper chest: 3 mm right middle fissure perifissural nodule, likely intrapulmonary lymph node. Review of the MIP images confirms the above findings CTA HEAD FINDINGS Anterior circulation: No significant stenosis, proximal occlusion, aneurysm, or vascular malformation. Mixed plaque of carotid siphons with mild stenosis. Posterior circulation: No significant stenosis, proximal occlusion, aneurysm, or vascular malformation. Mild mid basilar stenosis. Venous sinuses: As permitted by contrast timing, patent. Anatomic variants: Large right A1, large anterior communicating artery, diminutive left A1, normal variant. Fetal  left PCA. Small patent right posterior communicating artery. Review of the MIP images confirms the above findings CT Brain Perfusion Findings: CBF (<30%) Volume: 0mL Perfusion (Tmax>6.0s) volume: 0mL Mismatch Volume: 0mL Infarction Location:Negative. IMPRESSION: CTA neck: 1. Patent  bilateral carotid systems and right vertebral artery without hemodynamically significant stenosis by NASCET criteria, dissection, or aneurysm. 2. 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 CTA head: 1. No large vessel occlusion, high-grade stenosis, or aneurysm. 2. Mild atherosclerosis of carotid siphons and the basilar artery without significant stenosis. CT brain perfusion: Normal CT brain perfusion. These results were called by telephone at the time of interpretation on 08/11/2018 at 1:36 am to Dr. Preston Fleeting, who verbally acknowledged these results. Electronically Signed   By: Mitzi Hansen M.D.   On: 08/11/2018 01:40   Mr Brain Wo Contrast  Result Date: 08/11/2018 CLINICAL DATA:  Left-sided weakness. EXAM: MRI HEAD WITHOUT CONTRAST TECHNIQUE: Multiplanar, multiecho pulse sequences of the brain and surrounding structures were obtained without intravenous contrast. COMPARISON:  Head CT, CTA, and cerebral perfusion 08/11/2018 FINDINGS: Brain: An acute to early subacute right paramedian pontine infarct measures 12 mm. No intracranial hemorrhage, mass, midline shift, or extra-axial fluid collection is identified. The ventricles and sulci are normal. Scattered small foci of T2 hyperintensity in the cerebral white matter and pons separate from the infarct are nonspecific but compatible with mild chronic small vessel ischemic disease. Vascular: Major intracranial vascular flow voids are preserved. Skull and upper cervical spine: Unremarkable bone marrow signal. Sinuses/Orbits: Unremarkable orbits. Mild bilateral ethmoid air cell mucosal thickening. Trace mastoid effusions. Other: None. IMPRESSION: 1. Acute pontine infarct. 2. Mild chronic small vessel ischemic disease. Electronically Signed   By: Sebastian Ache M.D.   On: 08/11/2018 17:24    Ct Cerebral Perfusion W Contrast  Result Date: 08/11/2018 CLINICAL DATA:  57 y/o  M; acute onset left-sided weakness. EXAM: CT ANGIOGRAPHY HEAD AND NECK CT PERFUSION BRAIN TECHNIQUE: Multidetector CT imaging of the head and neck was performed using the standard protocol during bolus administration of intravenous contrast. Multiplanar CT image reconstructions and MIPs were obtained to evaluate the vascular anatomy. Carotid stenosis measurements (when applicable) are obtained utilizing NASCET criteria, using the distal internal carotid diameter as the denominator. Multiphase CT imaging of the brain was performed following IV bolus contrast injection. Subsequent parametric perfusion maps were calculated using RAPID software. CONTRAST:  ISOVUE-370 IOPAMIDOL (ISOVUE-370) INJECTION 76% COMPARISON:  08/11/2018 CT head. FINDINGS: CTA NECK FINDINGS Aortic arch: Bovine variant branching. Imaged portion shows no evidence of aneurysm or dissection. No significant stenosis of the major arch vessel origins. Right carotid system: No evidence of dissection, stenosis (50% or greater) or occlusion. Non stenotic calcific atherosclerosis of carotid bifurcation. Left carotid system: No evidence of dissection, stenosis (50% or greater) or occlusion. Non stenotic calcific atherosclerosis of carotid bifurcation. Vertebral arteries: Left dominant vertebral artery. Calcific atherosclerosis of left vertebral artery origin with severe stenosis. Beaded appearance of the left vertebral artery in the neck multiple segments of mild-to-moderate stenosis. Patent normal appearance of the right vertebral artery. Skeleton: Negative. Other neck: Negative. Upper chest: 3 mm right middle fissure perifissural nodule, likely intrapulmonary lymph node. Review of the MIP images confirms the above findings CTA HEAD FINDINGS Anterior circulation: No significant stenosis, proximal occlusion, aneurysm, or vascular malformation. Mixed plaque of  carotid siphons with mild stenosis. Posterior circulation: No significant stenosis, proximal occlusion,  aneurysm, or vascular malformation. Mild mid basilar stenosis. Venous sinuses: As permitted by contrast timing, patent. Anatomic variants: Large right A1, large anterior communicating artery, diminutive left A1, normal variant. Fetal left PCA. Small patent right posterior communicating artery. Review of the MIP images confirms the above findings CT Brain Perfusion Findings: CBF (<30%) Volume: 0mL Perfusion (Tmax>6.0s) volume: 0mL Mismatch Volume: 0mL Infarction Location:Negative. IMPRESSION: CTA neck: 1. Patent bilateral carotid systems and right vertebral artery without hemodynamically significant stenosis by NASCET criteria, dissection, or aneurysm. 2. 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 CTA head: 1. No large vessel occlusion, high-grade stenosis, or aneurysm. 2. Mild atherosclerosis of carotid siphons and the basilar artery without significant stenosis. CT brain perfusion: Normal CT brain perfusion. These results were called by telephone at the time of interpretation on 08/11/2018 at 1:36 am to Dr. Preston Fleeting, who verbally acknowledged these results. Electronically Signed   By: Mitzi Hansen M.D.   On: 08/11/2018 01:40   Ct Head Code Stroke Wo Contrast  Result Date: 08/11/2018 CLINICAL DATA:  Code stroke.  Sudden onset left-sided weakness. EXAM: CT HEAD WITHOUT CONTRAST TECHNIQUE: Contiguous axial images were obtained from the base of the skull through the vertex without intravenous contrast. COMPARISON:  06/24/2018 CT head FINDINGS: Brain: No evidence of acute infarction, hemorrhage, hydrocephalus, extra-axial collection or mass lesion/mass effect. Vascular: Asymmetric mildly increased density within the right terminal ICA  may represent thrombus (series 4, image 27). Skull: Normal. Negative for fracture or focal lesion. Sinuses/Orbits: No acute finding. Other: None. ASPECTS [facility] Stroke Program Early CT Score) - Ganglionic level infarction (caudate, lentiform nuclei, internal capsule, insula, M1-M3 cortex): 7 - Supraganglionic infarction (M4-M6 cortex): 3 Total score (0-10 with 10 being normal): 10 IMPRESSION: 1. No acute abnormality of the brain identified. 2. Asymmetric mildly increased density in right terminal ICA may reflect thrombus. 3. ASPECTS is 10 These results were called by telephone at the time of interpretation on 08/11/2018 at 12:34 am to Dr. Dione Booze , who verbally acknowledged these results. Electronically Signed   By: Mitzi Hansen M.D.   On: 08/11/2018 00:38    PHYSICAL EXAM  Temp:  [97.7 F (36.5 C)-98.4 F (36.9 C)] 98 F (36.7 C) (10/19 0800) Pulse Rate:  [71-97] 92 (10/19 0700) Resp:  [14-23] 18 (10/19 0700) BP: (102-168)/(58-104) 143/90 (10/19 0700) SpO2:  [93 %-99 %] 94 % (10/19 0700)  General - Well nourished, well developed, in no apparent distress.  Ophthalmologic - fundi not visualized due to noncooperation.  Cardiovascular - Regular rate and rhythm.  Mental Status -  Level of arousal and orientation to time, place, and person were intact. Language including expression, naming, repetition, comprehension was assessed and found intact. Fund of Knowledge was assessed and was intact.  Cranial Nerves II - XII - II - Visual field intact OU. III, IV, VI - Extraocular movements intact. V - Facial sensation intact bilaterally. VII -slight left nasolabial fold flattening. VIII - Hearing & vestibular intact bilaterally. X - Palate elevates symmetrically. XI - Chin turning & shoulder shrug intact bilaterally. XII - Tongue protrusion intact.  Motor Strength - The patient's strength was normal in RUE and RLE, however, 3/5 LUE proximal, 2/5 wrist extension, 0/5 finger  movement, 4+/5 LLE proximal, 3/5 distally. Bulk was normal and fasciculations were absent.   Motor Tone - Muscle tone was assessed at the neck and appendages and  was normal.  Reflexes - The patient's reflexes were symmetrical in all extremities and he had no pathological reflexes.  Sensory - Light touch, temperature/pinprick were assessed and were symmetrical.    Coordination - The patient had normal movements in the right hand with no ataxia or dysmetria.  Tremor was absent.  Gait and Station - deferred.   NIH SS 8 ASSESSMENT/PLAN Mr. NADIR VASQUES is a 57 y.o. male with history of HTN and tobacco abuse presenting with L hemiparesis. Received IV tPA 08/11/2017 at 0056.  Stroke:  right pontine infarct s/p tPA, likely due to small vessel disease given risk factors of hypertension, smoking, cocaine and THC use  Code Stroke CT head No acute stroke. Asymmetry R terminal ICA ? Thrombus. ASPECTS 10.     CTA head no LVO. Mild atherosclerosis   CTA neck extensive atherosclerosis L VA, severe stenosis/near occlusion  CT perfusion normal  MRI right pontine infarct  2D Echo EF 60 to 65%  LDL 152  HgbA1c 5.6  HIV/RPR negative  UDS positive for cocaine, THC and opiates  Lovenox for VTE prophylaxis  No antithrombotic prior to admission, now on aspirin 81 and Plavix 75.  Recommend DAPT for 3 weeks and then aspirin alone  Therapy recommendations: CIR  Disposition:  pending   Hypertensive Emergency  BP as high as 203/182  Home meds:  Amlodipine-benezepril 5-10 daily  Stable, off Cardene . Resumed home meds . Long-term BP goal normotensive  Hyperlipidemia  Home meds none  LDL 152, goal less than 70  On Lipitor 40  Continue statin on discharge  Cocaine abuse  UDS positive for cocaine  Pt denies cocaine use  Cessation education will be provided when wife leaves the room  Tobacco abuse  Current smoker  Smoking cessation counseling provided  Nicotine patch  provided  Pt is willing to quit  Other Stroke Risk Factors  Obesity, Body mass index is 31.94 kg/m., recommend weight loss, diet and exercise as appropriate   THC use, UDS positive for THC  Other Active Problems  Reflux on prilosec  BPH on flomax  L leg cramps. Add gabapentin prn  Hospital day # 1  This patient is critically ill due to pontine stroke status post TPA, hypertensive emergency, cocaine use and at significant risk of neurological worsening, death form recurrent stroke, hemorrhagic conversion, bleeding, seizure, cocaine withdrawal. This patient's care requires constant monitoring of vital signs, hemodynamics, respiratory and cardiac monitoring, review of multiple databases, neurological assessment, discussion with family, other specialists and medical decision making of high complexity. I spent 35 minutes of neurocritical care time in the care of this patient.  Marvel Plan, MD PhD Stroke Neurology 08/12/2018 9:34 AM     To contact Stroke Continuity provider, please refer to [email or web address]. After hours, contact General Neurology

## 2018-08-13 LAB — BASIC METABOLIC PANEL
Anion gap: 8 (ref 5–15)
BUN: 25 mg/dL — ABNORMAL HIGH (ref 6–20)
CO2: 24 mmol/L (ref 22–32)
Calcium: 8.7 mg/dL — ABNORMAL LOW (ref 8.9–10.3)
Chloride: 105 mmol/L (ref 98–111)
Creatinine, Ser: 1.07 mg/dL (ref 0.61–1.24)
GFR calc Af Amer: >60 mL/min (ref >60)
GFR calc non Af Amer: >60 mL/min (ref >60)
Glucose, Bld: 112 mg/dL — ABNORMAL HIGH (ref 70–99)
Potassium: 3.8 mmol/L (ref 3.5–5.1)
Sodium: 137 mmol/L (ref 135–145)

## 2018-08-13 LAB — CBC
HCT: 40.3 % (ref 39.0–52.0)
Hemoglobin: 13.2 g/dL (ref 13.0–17.0)
MCH: 30.8 pg (ref 26.0–34.0)
MCHC: 32.8 g/dL (ref 30.0–36.0)
MCV: 94.2 fL (ref 80.0–100.0)
Platelets: 214 10*3/uL (ref 150–400)
RBC: 4.28 MIL/uL (ref 4.22–5.81)
RDW: 13.7 % (ref 11.5–15.5)
WBC: 6.3 10*3/uL (ref 4.0–10.5)
nRBC: 0 % (ref 0.0–0.2)

## 2018-08-13 NOTE — Evaluation (Signed)
Occupational Therapy Evaluation Patient Details Name: [name] MRN: [id number] DOB: [birth year] Today's Date: 08/13/2018    History of Present Illness 57 year old male admitted 08/11/18 with left sided weakness. TPA given. PMH: GERD, HTN, kidney stones, + tobacco use. 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   Clinical Impression   PTA patient independent and working. Currently admitted for above and limited by L UE hemiparesis, impaired balance, and decreased safety.  Requires min assist for UB ADLs, mod-max assist for LB ADLs, min assist for toilet transfers, and mod assist for toileting.  Pt reports improving return of L UE strength/coordination, proximal to distal; provided with ROM, weightbearing and squeeze ball exercises to assist neuro return.  Patient will have support of spouse 24/7 at dc, and at this time believe intensive CIR level therapy will best benefit pt in order to maximize independence with ADLs and mobility.  Will continue to follow.      Follow Up Recommendations  CIR    Equipment Recommendations  Other (comment)(TBD at next venue of care)    Recommendations for Other Services Rehab consult     Precautions / Restrictions Precautions Precautions: Fall Restrictions Weight Bearing Restrictions: No      Mobility Bed Mobility Overal bed mobility: Needs Assistance Bed Mobility: Supine to Sit     Supine to sit: Min assist     General bed mobility comments: min assist for trunk support to come to EOB  Transfers Overall transfer level: Needs assistance Equipment used: 1 person hand held assist Transfers: Sit to/from Stand Sit to Stand: Min assist         General transfer comment: min assist for balance/stability when transitioning to standing    Balance Overall balance assessment: Needs assistance Sitting-balance support: Feet supported Sitting balance-Leahy Scale: Fair     Standing  balance support: Single extremity supported Standing balance-Leahy Scale: Poor Standing balance comment: reliance on external support                           ADL either performed or assessed with clinical judgement   ADL Overall ADL's : Needs assistance/impaired     Grooming: Set up;Sitting   Upper Body Bathing: Minimal assistance;Sitting   Lower Body Bathing: Moderate assistance;Sit to/from stand   Upper Body Dressing : Minimal assistance;Sitting   Lower Body Dressing: Maximal assistance;Sit to/from stand   Toilet Transfer: Minimal assistance;Ambulation(simulated to recliner)   Toileting- Clothing Manipulation and Hygiene: Moderate assistance;Sit to/from stand       Functional mobility during ADLs: Minimal assistance;Cueing for safety General ADL Comments: Pt educated on hemi dressing technqiues, safety and pacing.       Vision Baseline Vision/History: No visual deficits Vision Assessment?: Yes Eye Alignment: Within Functional Limits Ocular Range of Motion: Within Functional Limits Alignment/Gaze Preference: Within Defined Limits Tracking/Visual Pursuits: Able to track stimulus in all quads without difficulty Saccades: Within functional limits Visual Fields: No apparent deficits     Perception     Praxis      Pertinent Vitals/Pain Pain Assessment: No/denies pain     Hand Dominance Right   Extremity/Trunk Assessment Upper Extremity Assessment Upper Extremity Assessment: LUE deficits/detail LUE Deficits / Details: shoulder FF 3/5, elbow 3/5, forearm 3-/5, wrist 2-/5. hand 2-/5; able to use as gross stabilizer  LUE Sensation: WNL LUE Coordination: decreased fine motor;decreased gross motor   Lower Extremity Assessment Lower Extremity Assessment: Defer  to PT evaluation   Cervical / Trunk Assessment Cervical / Trunk Assessment: Normal   Communication Communication Communication: No difficulties   Cognition Arousal/Alertness:  Awake/alert Behavior During Therapy: WFL for tasks assessed/performed Overall Cognitive Status: Within Functional Limits for tasks assessed                                     General Comments  spouse present and supportive    Exercises Exercises: Other exercises Other Exercises Other Exercises: shoulder flexion to 90 x 5, elbow flex/ext x 5, issued squeeze ball for L hand; reviewed weightbearing through L hand   Shoulder Instructions      Home Living Family/patient expects to be discharged to:: Private residence Living Arrangements: Spouse/significant other Available Help at Discharge: Family;Available 24 hours/day Type of Home: Mobile home Home Access: Stairs to enter Entrance Stairs-Number of Steps: 3 Entrance Stairs-Rails: None Home Layout: One level     Bathroom Shower/Tub: Chief Strategy Officer: Standard     Home Equipment: None          Prior Functioning/Environment Level of Independence: Independent        Comments: working and driving         OT Problem List: Decreased strength;Decreased range of motion;Decreased activity tolerance;Impaired balance (sitting and/or standing);Decreased coordination;Decreased knowledge of use of DME or AE;Decreased knowledge of precautions;Impaired UE functional use      OT Treatment/Interventions: Self-care/ADL training;Therapeutic exercise;Neuromuscular education;Energy conservation;DME and/or AE instruction;Therapeutic activities;Patient/family education;Balance training;Modalities;Manual therapy    OT Goals(Current goals can be found in the care plan section) Acute Rehab OT Goals Patient Stated Goal: to get better OT Goal Formulation: With patient Time For Goal Achievement: 08/27/18 Potential to Achieve Goals: Good  OT Frequency: Min 3X/week   Barriers to D/C:            Co-evaluation              AM-PAC PT "6 Clicks" Daily Activity     Outcome Measure Help from another  person eating meals?: None Help from another person taking care of personal grooming?: None Help from another person toileting, which includes using toliet, bedpan, or urinal?: A Lot Help from another person bathing (including washing, rinsing, drying)?: A Lot Help from another person to put on and taking off regular upper body clothing?: A Little Help from another person to put on and taking off regular lower body clothing?: A Lot 6 Click Score: 17   End of Session Equipment Utilized During Treatment: Gait belt Nurse Communication: Mobility status  Activity Tolerance: Patient tolerated treatment well Patient left: in chair;with call bell/phone within reach;with family/visitor present  OT Visit Diagnosis: Unsteadiness on feet (R26.81);Other abnormalities of gait and mobility (R26.89);Muscle weakness (generalized) (M62.81);Hemiplegia and hemiparesis Hemiplegia - Right/Left: Left Hemiplegia - dominant/non-dominant: Non-Dominant Hemiplegia - caused by: Cerebral infarction                Time: 1610-9604 OT Time Calculation (min): 22 min Charges:  OT General Charges $OT Visit: 1 Visit OT Evaluation $OT Eval Moderate Complexity: 1 Mod  Chancy Milroy, OT Acute Rehabilitation Services Pager [phone] Office [phone]   Chancy Milroy 08/13/2018, 1:00 PM

## 2018-08-13 NOTE — Progress Notes (Signed)
Physical Therapy Treatment Patient Details Name: [name] MRN: [id number] DOB: 10-03-1961 Today's Date: 08/13/2018    History of Present Illness 57 year old male admitted 08/11/18 with left sided weakness. TPA given. PMH: GERD, HTN, kidney stones, + tobacco use. 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    PT Comments    Patient seen for activity progression with focus on Left sided deficits and gait retraining. 2 bouts of ambulation in room distance. Multi modal cues for quad setting and loading provided. Patient continues to demonstrate limitation in ability to take on and control LLE. Will continue to see and progress as tolerated. CIR remains appropriate at this time.   Follow Up Recommendations  CIR     Equipment Recommendations  (TBD)    Recommendations for Other Services Rehab consult     Precautions / Restrictions Precautions Precautions: Fall Restrictions Weight Bearing Restrictions: No    Mobility  Bed Mobility Overal bed mobility: Needs Assistance Bed Mobility: Supine to Sit     Supine to sit: Supervision     General bed mobility comments: Increased time to come to EOB, no physical assist required  Transfers Overall transfer level: Needs assistance Equipment used: 1 person hand held assist Transfers: Sit to/from Stand Sit to Stand: Min assist         General transfer comment: Continues to require min assist for stability upon power up. HHA provided on right due to LUE weakness  Ambulation/Gait Ambulation/Gait assistance: Mod assist Gait Distance (Feet): 22 Feet Assistive device: 1 person hand held assist Gait Pattern/deviations: Step-to pattern;Decreased stride length;Decreased weight shift to left;Shuffle;Staggering left Gait velocity: decreased Gait velocity interpretation: <1.31 ft/sec, indicative of household ambulator General Gait Details: Patient cued for quad setting during  loading response, noted extreme genu recurvatum during transition. Attempted HHA via LUE briefly but unable to maintain without significant loss of control.    Stairs             Wheelchair Mobility    Modified Rankin (Stroke Patients Only) Modified Rankin (Stroke Patients Only) Pre-Morbid Rankin Score: No symptoms Modified Rankin: Moderately severe disability     Balance Overall balance assessment: Needs assistance Sitting-balance support: Feet supported Sitting balance-Leahy Scale: Fair     Standing balance support: Single extremity supported Standing balance-Leahy Scale: Poor Standing balance comment: reliance on external support                            Cognition Arousal/Alertness: Awake/alert Behavior During Therapy: WFL for tasks assessed/performed Overall Cognitive Status: Within Functional Limits for tasks assessed                                        Exercises      General Comments        Pertinent Vitals/Pain Pain Assessment: 0-10 Pain Score: 5  Pain Location: head Pain Descriptors / Indicators: Headache Pain Intervention(s): Monitored during session    Home Living                      Prior Function            PT Goals (current goals can now be found in the care plan section) Acute Rehab PT Goals Patient Stated Goal: to get better PT Goal Formulation: With patient/family  Time For Goal Achievement: 08/26/18 Potential to Achieve Goals: Good Progress towards PT goals: Progressing toward goals    Frequency    Min 4X/week      PT Plan Current plan remains appropriate    Co-evaluation              AM-PAC PT "6 Clicks" Daily Activity  Outcome Measure  Difficulty turning over in bed (including adjusting bedclothes, sheets and blankets)?: A Little Difficulty moving from lying on back to sitting on the side of the bed? : A Little Difficulty sitting down on and standing up from a chair  with arms (e.g., wheelchair, bedside commode, etc,.)?: Unable Help needed moving to and from a bed to chair (including a wheelchair)?: A Little Help needed walking in hospital room?: A Lot Help needed climbing 3-5 steps with a railing? : A Lot 6 Click Score: 14    End of Session Equipment Utilized During Treatment: Gait belt Activity Tolerance: Patient tolerated treatment well Patient left: in chair;with call bell/phone within reach;with family/visitor present Nurse Communication: Mobility status PT Visit Diagnosis: Unsteadiness on feet (R26.81);Other symptoms and signs involving the nervous system (W09.811)     Time: 9147-8295 PT Time Calculation (min) (ACUTE ONLY): 18 min  Charges:  $Gait Training: 8-22 mins                     Charlotte Crumb, PT DPT  Board Certified Neurologic Specialist Acute Rehabilitation Services Pager [phone] Office [phone]    Fabio Asa 08/13/2018, 8:04 AM

## 2018-08-13 NOTE — Progress Notes (Signed)
STROKE TEAM PROGRESS NOTE   INTERVAL HISTORY His wife is at the bedside.  Patient lying in bed, stated left sided weakness getting better. Able to walk with PT this am with minimal assistance. Pending CIR  Vitals:   08/12/18 2045 08/13/18 0050 08/13/18 0437 08/13/18 0830  BP: (!) 149/93 115/72 118/69 126/81  Pulse: 74 78 85   Resp: 18 18 18    Temp: 98.2 F (36.8 C) 98.4 F (36.9 C) 97.8 F (36.6 C) 98 F (36.7 C)  TempSrc: Oral Oral Oral   SpO2: 96% 92% 94%   Weight:      Height:        CBC:  Recent Labs  Lab 08/11/18 0024  08/12/18 0607 08/13/18 0441  WBC 10.9*   < > 6.5 6.3  NEUTROABS 8.8*  --   --   --   HGB 13.6   < > 12.9* 13.2  HCT 41.4   < > 40.8 40.3  MCV 93.9   < > 94.4 94.2  PLT 220   < > 225 214   < > = values in this interval not displayed.    Basic Metabolic Panel:  Recent Labs  Lab 08/12/18 0607 08/13/18 0441  NA 137 137  K 3.9 3.8  CL 105 105  CO2 26 24  GLUCOSE 100* 112*  BUN 16 25*  CREATININE 1.07 1.07  CALCIUM 8.9 8.7*   Lipid Panel:     Component Value Date/Time   CHOL 225 (H) 08/11/2018 0831   CHOL 244 (H) 02/15/2014 1630   TRIG 200 (H) 08/11/2018 0831   HDL 33 (L) 08/11/2018 0831   HDL 30 (L) 02/15/2014 1630   CHOLHDL 6.8 08/11/2018 0831   VLDL 40 08/11/2018 0831   LDLCALC 152 (H) 08/11/2018 0831   LDLCALC 168 (H) 02/15/2014 1630   HgbA1c:  Lab Results  Component Value Date   HGBA1C 5.6 08/11/2018   Urine Drug Screen:     Component Value Date/Time   LABOPIA POSITIVE (A) 08/12/2018 0201   COCAINSCRNUR POSITIVE (A) 08/12/2018 0201   LABBENZ NONE DETECTED 08/12/2018 0201   AMPHETMU NONE DETECTED 08/12/2018 0201   THCU POSITIVE (A) 08/12/2018 0201   LABBARB NONE DETECTED 08/12/2018 0201    Alcohol Level     Component Value Date/Time   ETH <10 08/11/2018 0024    IMAGING  Ct Angio Head W Or Wo Contrast  Result Date: 08/11/2018 CLINICAL DATA:  57 y/o  M; acute onset left-sided weakness. EXAM: CT ANGIOGRAPHY HEAD  AND NECK CT PERFUSION BRAIN TECHNIQUE: Multidetector CT imaging of the head and neck was performed using the standard protocol during bolus administration of intravenous contrast. Multiplanar CT image reconstructions and MIPs were obtained to evaluate the vascular anatomy. Carotid stenosis measurements (when applicable) are obtained utilizing NASCET criteria, using the distal internal carotid diameter as the denominator. Multiphase CT imaging of the brain was performed following IV bolus contrast injection. Subsequent parametric perfusion maps were calculated using RAPID software. CONTRAST:  ISOVUE-370 IOPAMIDOL (ISOVUE-370) INJECTION 76% COMPARISON:  08/11/2018 CT head. FINDINGS: CTA NECK FINDINGS Aortic arch: Bovine variant branching. Imaged portion shows no evidence of aneurysm or dissection. No significant stenosis of the major arch vessel origins. Right carotid system: No evidence of dissection, stenosis (50% or greater) or occlusion. Non stenotic calcific atherosclerosis of carotid bifurcation. Left carotid system: No evidence of dissection, stenosis (50% or greater) or occlusion. Non stenotic calcific atherosclerosis of carotid bifurcation. Vertebral arteries: Left dominant vertebral artery. Calcific atherosclerosis  of left vertebral artery origin with severe stenosis. Beaded appearance of the left vertebral artery in the neck multiple segments of mild-to-moderate stenosis. Patent normal appearance of the right vertebral artery. Skeleton: Negative. Other neck: Negative. Upper chest: 3 mm right middle fissure perifissural nodule, likely intrapulmonary lymph node. Review of the MIP images confirms the above findings CTA HEAD FINDINGS Anterior circulation: No significant stenosis, proximal occlusion, aneurysm, or vascular malformation. Mixed plaque of carotid siphons with mild stenosis. Posterior circulation: No significant stenosis, proximal occlusion, aneurysm, or vascular malformation. Mild mid basilar  stenosis. Venous sinuses: As permitted by contrast timing, patent. Anatomic variants: Large right A1, large anterior communicating artery, diminutive left A1, normal variant. Fetal left PCA. Small patent right posterior communicating artery. Review of the MIP images confirms the above findings CT Brain Perfusion Findings: CBF (<30%) Volume: 0mL Perfusion (Tmax>6.0s) volume: 0mL Mismatch Volume: 0mL Infarction Location:Negative. IMPRESSION: CTA neck: 1. Patent bilateral carotid systems and right vertebral artery without hemodynamically significant stenosis by NASCET criteria, dissection, or aneurysm. 2. 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 CTA head: 1. No large vessel occlusion, high-grade stenosis, or aneurysm. 2. Mild atherosclerosis of carotid siphons and the basilar artery without significant stenosis. CT brain perfusion: Normal CT brain perfusion. These results were called by telephone at the time of interpretation on 08/11/2018 at 1:36 am to Dr. Preston Fleeting, who verbally acknowledged these results. Electronically Signed   By: Mitzi Hansen M.D.   On: 08/11/2018 01:40   Ct Angio Neck W Or Wo Contrast  Result Date: 08/11/2018 CLINICAL DATA:  57 y/o  M; acute onset left-sided weakness. EXAM: CT ANGIOGRAPHY HEAD AND NECK CT PERFUSION BRAIN TECHNIQUE: Multidetector CT imaging of the head and neck was performed using the standard protocol during bolus administration of intravenous contrast. Multiplanar CT image reconstructions and MIPs were obtained to evaluate the vascular anatomy. Carotid stenosis measurements (when applicable) are obtained utilizing NASCET criteria, using the distal internal carotid diameter as the denominator. Multiphase CT imaging of the brain was performed following IV bolus contrast injection. Subsequent  parametric perfusion maps were calculated using RAPID software. CONTRAST:  ISOVUE-370 IOPAMIDOL (ISOVUE-370) INJECTION 76% COMPARISON:  08/11/2018 CT head. FINDINGS: CTA NECK FINDINGS Aortic arch: Bovine variant branching. Imaged portion shows no evidence of aneurysm or dissection. No significant stenosis of the major arch vessel origins. Right carotid system: No evidence of dissection, stenosis (50% or greater) or occlusion. Non stenotic calcific atherosclerosis of carotid bifurcation. Left carotid system: No evidence of dissection, stenosis (50% or greater) or occlusion. Non stenotic calcific atherosclerosis of carotid bifurcation. Vertebral arteries: Left dominant vertebral artery. Calcific atherosclerosis of left vertebral artery origin with severe stenosis. Beaded appearance of the left vertebral artery in the neck multiple segments of mild-to-moderate stenosis. Patent normal appearance of the right vertebral artery. Skeleton: Negative. Other neck: Negative. Upper chest: 3 mm right middle fissure perifissural nodule, likely intrapulmonary lymph node. Review of the MIP images confirms the above findings CTA HEAD FINDINGS Anterior circulation: No significant stenosis, proximal occlusion, aneurysm, or vascular malformation. Mixed plaque of carotid siphons with mild stenosis. Posterior circulation: No significant stenosis, proximal occlusion, aneurysm, or vascular malformation. Mild mid basilar stenosis. Venous sinuses: As permitted by contrast timing, patent. Anatomic variants: Large right A1, large anterior communicating artery, diminutive left A1, normal variant. Fetal left PCA. Small patent right posterior communicating artery. Review of the MIP images  confirms the above findings CT Brain Perfusion Findings: CBF (<30%) Volume: 0mL Perfusion (Tmax>6.0s) volume: 0mL Mismatch Volume: 0mL Infarction Location:Negative. IMPRESSION: CTA neck: 1. Patent bilateral carotid systems and right vertebral artery without  hemodynamically significant stenosis by NASCET criteria, dissection, or aneurysm. 2. 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 CTA head: 1. No large vessel occlusion, high-grade stenosis, or aneurysm. 2. Mild atherosclerosis of carotid siphons and the basilar artery without significant stenosis. CT brain perfusion: Normal CT brain perfusion. These results were called by telephone at the time of interpretation on 08/11/2018 at 1:36 am to Dr. Preston Fleeting, who verbally acknowledged these results. Electronically Signed   By: Mitzi Hansen M.D.   On: 08/11/2018 01:40   Mr Brain Wo Contrast  Result Date: 08/11/2018 CLINICAL DATA:  Left-sided weakness. EXAM: MRI HEAD WITHOUT CONTRAST TECHNIQUE: Multiplanar, multiecho pulse sequences of the brain and surrounding structures were obtained without intravenous contrast. COMPARISON:  Head CT, CTA, and cerebral perfusion 08/11/2018 FINDINGS: Brain: An acute to early subacute right paramedian pontine infarct measures 12 mm. No intracranial hemorrhage, mass, midline shift, or extra-axial fluid collection is identified. The ventricles and sulci are normal. Scattered small foci of T2 hyperintensity in the cerebral white matter and pons separate from the infarct are nonspecific but compatible with mild chronic small vessel ischemic disease. Vascular: Major intracranial vascular flow voids are preserved. Skull and upper cervical spine: Unremarkable bone marrow signal. Sinuses/Orbits: Unremarkable orbits. Mild bilateral ethmoid air cell mucosal thickening. Trace mastoid effusions. Other: None. IMPRESSION: 1. Acute pontine infarct. 2. Mild chronic small vessel ischemic disease. Electronically Signed   By: Sebastian Ache M.D.   On: 08/11/2018 17:24   Ct Cerebral Perfusion W Contrast  Result Date:  08/11/2018 CLINICAL DATA:  57 y/o  M; acute onset left-sided weakness. EXAM: CT ANGIOGRAPHY HEAD AND NECK CT PERFUSION BRAIN TECHNIQUE: Multidetector CT imaging of the head and neck was performed using the standard protocol during bolus administration of intravenous contrast. Multiplanar CT image reconstructions and MIPs were obtained to evaluate the vascular anatomy. Carotid stenosis measurements (when applicable) are obtained utilizing NASCET criteria, using the distal internal carotid diameter as the denominator. Multiphase CT imaging of the brain was performed following IV bolus contrast injection. Subsequent parametric perfusion maps were calculated using RAPID software. CONTRAST:  ISOVUE-370 IOPAMIDOL (ISOVUE-370) INJECTION 76% COMPARISON:  08/11/2018 CT head. FINDINGS: CTA NECK FINDINGS Aortic arch: Bovine variant branching. Imaged portion shows no evidence of aneurysm or dissection. No significant stenosis of the major arch vessel origins. Right carotid system: No evidence of dissection, stenosis (50% or greater) or occlusion. Non stenotic calcific atherosclerosis of carotid bifurcation. Left carotid system: No evidence of dissection, stenosis (50% or greater) or occlusion. Non stenotic calcific atherosclerosis of carotid bifurcation. Vertebral arteries: Left dominant vertebral artery. Calcific atherosclerosis of left vertebral artery origin with severe stenosis. Beaded appearance of the left vertebral artery in the neck multiple segments of mild-to-moderate stenosis. Patent normal appearance of the right vertebral artery. Skeleton: Negative. Other neck: Negative. Upper chest: 3 mm right middle fissure perifissural nodule, likely intrapulmonary lymph node. Review of the MIP images confirms the above findings CTA HEAD FINDINGS Anterior circulation: No significant stenosis, proximal occlusion, aneurysm, or vascular malformation. Mixed plaque of carotid siphons with mild stenosis. Posterior circulation:  No significant stenosis, proximal occlusion, aneurysm, or vascular malformation. Mild mid basilar stenosis. Venous sinuses: As permitted by  contrast timing, patent. Anatomic variants: Large right A1, large anterior communicating artery, diminutive left A1, normal variant. Fetal left PCA. Small patent right posterior communicating artery. Review of the MIP images confirms the above findings CT Brain Perfusion Findings: CBF (<30%) Volume: 0mL Perfusion (Tmax>6.0s) volume: 0mL Mismatch Volume: 0mL Infarction Location:Negative. IMPRESSION: CTA neck: 1. Patent bilateral carotid systems and right vertebral artery without hemodynamically significant stenosis by NASCET criteria, dissection, or aneurysm. 2. 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 CTA head: 1. No large vessel occlusion, high-grade stenosis, or aneurysm. 2. Mild atherosclerosis of carotid siphons and the basilar artery without significant stenosis. CT brain perfusion: Normal CT brain perfusion. These results were called by telephone at the time of interpretation on 08/11/2018 at 1:36 am to Dr. Preston Fleeting, who verbally acknowledged these results. Electronically Signed   By: Mitzi Hansen M.D.   On: 08/11/2018 01:40   Ct Head Code Stroke Wo Contrast  Result Date: 08/11/2018 CLINICAL DATA:  Code stroke.  Sudden onset left-sided weakness. EXAM: CT HEAD WITHOUT CONTRAST TECHNIQUE: Contiguous axial images were obtained from the base of the skull through the vertex without intravenous contrast. COMPARISON:  06/24/2018 CT head FINDINGS: Brain: No evidence of acute infarction, hemorrhage, hydrocephalus, extra-axial collection or mass lesion/mass effect. Vascular: Asymmetric mildly increased density within the right terminal ICA may represent thrombus (series 4, image 27). Skull: Normal.  Negative for fracture or focal lesion. Sinuses/Orbits: No acute finding. Other: None. ASPECTS [facility] Stroke Program Early CT Score) - Ganglionic level infarction (caudate, lentiform nuclei, internal capsule, insula, M1-M3 cortex): 7 - Supraganglionic infarction (M4-M6 cortex): 3 Total score (0-10 with 10 being normal): 10 IMPRESSION: 1. No acute abnormality of the brain identified. 2. Asymmetric mildly increased density in right terminal ICA may reflect thrombus. 3. ASPECTS is 10 These results were called by telephone at the time of interpretation on 08/11/2018 at 12:34 am to Dr. Dione Booze , who verbally acknowledged these results. Electronically Signed   By: Mitzi Hansen M.D.   On: 08/11/2018 00:38    PHYSICAL EXAM  Temp:  [97.5 F (36.4 C)-98.4 F (36.9 C)] 98 F (36.7 C) (10/20 0830) Pulse Rate:  [74-93] 85 (10/20 0437) Resp:  [16-19] 18 (10/20 0437) BP: (115-149)/(67-93) 126/81 (10/20 0830) SpO2:  [92 %-97 %] 94 % (10/20 0437)  General - Well nourished, well developed, in no apparent distress.  Ophthalmologic - fundi not visualized due to noncooperation.  Cardiovascular - Regular rate and rhythm.  Mental Status -  Level of arousal and orientation to time, place, and person were intact. Language including expression, naming, repetition, comprehension was assessed and found intact. Fund of Knowledge was assessed and was intact.  Cranial Nerves II - XII - II - Visual field intact OU. III, IV, VI - Extraocular movements intact. V - Facial sensation intact bilaterally. VII -slight left nasolabial fold flattening. VIII - Hearing & vestibular intact bilaterally. X - Palate elevates symmetrically. XI - Chin turning & shoulder shrug intact bilaterally. XII - Tongue protrusion intact.  Motor Strength - The patient's strength was normal in RUE and RLE, however, 3+/5 LUE proximal, 2+/5 wrist extension and finger movement, 4+/5 LLE proximal, 3+/5 distally. Bulk was normal  and fasciculations were absent.   Motor Tone - Muscle tone was assessed at the neck and appendages and was normal.  Reflexes - The patient's reflexes were symmetrical in all extremities  and he had no pathological reflexes.  Sensory - Light touch, temperature/pinprick were assessed and were symmetrical.    Coordination - The patient had normal movements in the right hand with no ataxia or dysmetria.  Tremor was absent.  Gait and Station - deferred.  NIH SS 8   ASSESSMENT/PLAN Mr. CRU KRITIKOS is a 57 y.o. male with history of HTN and tobacco abuse presenting with L hemiparesis. Received IV tPA 08/11/2017 at 0056.  Stroke:  right pontine infarct s/p tPA, likely due to small vessel disease given risk factors of hypertension, smoking, cocaine and THC use  Code Stroke CT head No acute stroke. Asymmetry R terminal ICA ? Thrombus. ASPECTS 10.     CTA head no LVO. Mild atherosclerosis   CTA neck extensive atherosclerosis L VA, severe stenosis/near occlusion  CT perfusion normal  MRI right pontine infarct  2D Echo EF 60 to 65%  LDL 152  HgbA1c 5.6  HIV/RPR negative  UDS positive for cocaine, THC and opiates  Lovenox for VTE prophylaxis  No antithrombotic prior to admission, now on aspirin 81 and Plavix 75.  Recommend DAPT for 3 weeks and then aspirin alone  Therapy recommendations: CIR  Disposition:  pending   Hypertensive Emergency  Stable   Home meds:  Amlodipine-benezepril 5-10 daily  Stable, off Cardene . Resumed home meds . Long-term BP goal normotensive  Hyperlipidemia  Home meds none  LDL 152, goal less than 70  On Lipitor 40  Continue statin on discharge  Cocaine abuse  UDS positive for cocaine  Pt denies cocaine use  Cessation education will be provided when wife leaves the room  Tobacco abuse  Current smoker  Smoking cessation counseling provided  Nicotine patch provided  Pt is willing to quit  Other Stroke Risk Factors  Obesity,  Body mass index is 31.94 kg/m., recommend weight loss, diet and exercise as appropriate   THC use, UDS positive for THC  Other Active Problems  Reflux on prilosec  BPH on flomax  L leg cramps. Add gabapentin prn  Hospital day # 2  Marvel Plan, MD PhD Stroke Neurology 08/13/2018 11:49 AM    To contact Stroke Continuity provider, please refer to [email or web address]. After hours, contact General Neurology

## 2018-08-14 ENCOUNTER — Encounter (HOSPITAL_COMMUNITY): Payer: Self-pay

## 2018-08-14 ENCOUNTER — Inpatient Hospital Stay (HOSPITAL_COMMUNITY)
Admission: RE | Admit: 2018-08-14 | Discharge: 2018-08-20 | DRG: 057 | Disposition: A | Discharge status: Home Health | Payer: Self-pay | Source: Intra-hospital | Attending: Physical Medicine & Rehabilitation | Admitting: Physical Medicine & Rehabilitation

## 2018-08-14 ENCOUNTER — Other Ambulatory Visit: Payer: Self-pay

## 2018-08-14 DIAGNOSIS — N400 Enlarged prostate without lower urinary tract symptoms: Secondary | ICD-10-CM | POA: Diagnosis present

## 2018-08-14 DIAGNOSIS — Z8349 Family history of other endocrine, nutritional and metabolic diseases: Secondary | ICD-10-CM

## 2018-08-14 DIAGNOSIS — F17200 Nicotine dependence, unspecified, uncomplicated: Secondary | ICD-10-CM | POA: Diagnosis present

## 2018-08-14 DIAGNOSIS — I6350 Cerebral infarction due to unspecified occlusion or stenosis of unspecified cerebral artery: Secondary | ICD-10-CM | POA: Diagnosis present

## 2018-08-14 DIAGNOSIS — Z8249 Family history of ischemic heart disease and other diseases of the circulatory system: Secondary | ICD-10-CM

## 2018-08-14 DIAGNOSIS — E669 Obesity, unspecified: Secondary | ICD-10-CM | POA: Diagnosis present

## 2018-08-14 DIAGNOSIS — Z880 Allergy status to penicillin: Secondary | ICD-10-CM

## 2018-08-14 DIAGNOSIS — K219 Gastro-esophageal reflux disease without esophagitis: Secondary | ICD-10-CM | POA: Diagnosis present

## 2018-08-14 DIAGNOSIS — Z833 Family history of diabetes mellitus: Secondary | ICD-10-CM

## 2018-08-14 DIAGNOSIS — M549 Dorsalgia, unspecified: Secondary | ICD-10-CM | POA: Diagnosis present

## 2018-08-14 DIAGNOSIS — N182 Chronic kidney disease, stage 2 (mild): Secondary | ICD-10-CM | POA: Diagnosis present

## 2018-08-14 DIAGNOSIS — G8929 Other chronic pain: Secondary | ICD-10-CM | POA: Diagnosis present

## 2018-08-14 DIAGNOSIS — F1910 Other psychoactive substance abuse, uncomplicated: Secondary | ICD-10-CM | POA: Diagnosis present

## 2018-08-14 DIAGNOSIS — I639 Cerebral infarction, unspecified: Principal | ICD-10-CM

## 2018-08-14 DIAGNOSIS — E785 Hyperlipidemia, unspecified: Secondary | ICD-10-CM | POA: Diagnosis present

## 2018-08-14 DIAGNOSIS — Z9049 Acquired absence of other specified parts of digestive tract: Secondary | ICD-10-CM

## 2018-08-14 DIAGNOSIS — I69354 Hemiplegia and hemiparesis following cerebral infarction affecting left non-dominant side: Principal | ICD-10-CM

## 2018-08-14 DIAGNOSIS — I6302 Cerebral infarction due to thrombosis of basilar artery: Secondary | ICD-10-CM

## 2018-08-14 DIAGNOSIS — R252 Cramp and spasm: Secondary | ICD-10-CM | POA: Diagnosis present

## 2018-08-14 DIAGNOSIS — I10 Essential (primary) hypertension: Secondary | ICD-10-CM

## 2018-08-14 DIAGNOSIS — R0989 Other specified symptoms and signs involving the circulatory and respiratory systems: Secondary | ICD-10-CM

## 2018-08-14 DIAGNOSIS — Z87442 Personal history of urinary calculi: Secondary | ICD-10-CM

## 2018-08-14 DIAGNOSIS — I129 Hypertensive chronic kidney disease with stage 1 through stage 4 chronic kidney disease, or unspecified chronic kidney disease: Secondary | ICD-10-CM | POA: Diagnosis present

## 2018-08-14 DIAGNOSIS — I5189 Other ill-defined heart diseases: Secondary | ICD-10-CM

## 2018-08-14 DIAGNOSIS — F17210 Nicotine dependence, cigarettes, uncomplicated: Secondary | ICD-10-CM | POA: Diagnosis present

## 2018-08-14 DIAGNOSIS — Z885 Allergy status to narcotic agent status: Secondary | ICD-10-CM

## 2018-08-14 MED ORDER — ENOXAPARIN SODIUM 40 MG/0.4ML ~~LOC~~ SOLN: 40.0000 mg | SUBCUTANEOUS | Status: DC

## 2018-08-14 MED ORDER — CLOPIDOGREL BISULFATE 75 MG PO TABS
75.0000 mg | ORAL_TABLET | Freq: Every day | ORAL | Status: DC
  Administered 2018-08-15 – 2018-08-20 (×6): 75 mg via ORAL
  Filled 2018-08-14 (×6): qty 1

## 2018-08-14 MED ORDER — PNEUMOCOCCAL VAC POLYVALENT 25 MCG/0.5ML IJ INJ
0.5000 mL | INJECTION | Freq: Once | INTRAMUSCULAR | Status: AC
  Administered 2018-08-14: 0.5 mL via INTRAMUSCULAR
  Filled 2018-08-14: qty 0.5

## 2018-08-14 MED ORDER — ENOXAPARIN SODIUM 40 MG/0.4ML ~~LOC~~ SOLN
40.0000 mg | SUBCUTANEOUS | Status: DC
  Administered 2018-08-15 – 2018-08-20 (×6): 40 mg via SUBCUTANEOUS
  Filled 2018-08-14 (×6): qty 0.4

## 2018-08-14 MED ORDER — GABAPENTIN 300 MG PO CAPS: 300.0000 mg | ORAL_CAPSULE | Freq: Three times a day (TID) | ORAL | Status: DC | PRN

## 2018-08-14 MED ORDER — GABAPENTIN 300 MG PO CAPS
300.0000 mg | ORAL_CAPSULE | Freq: Three times a day (TID) | ORAL | Status: DC | PRN
  Administered 2018-08-15 – 2018-08-18 (×4): 300 mg via ORAL
  Filled 2018-08-14 (×4): qty 1

## 2018-08-14 MED ORDER — ASPIRIN EC 81 MG PO TBEC
81.0000 mg | DELAYED_RELEASE_TABLET | Freq: Every day | ORAL | Status: DC
  Administered 2018-08-15 – 2018-08-20 (×6): 81 mg via ORAL
  Filled 2018-08-14 (×6): qty 1

## 2018-08-14 MED ORDER — TAMSULOSIN HCL 0.4 MG PO CAPS
0.4000 mg | ORAL_CAPSULE | Freq: Every day | ORAL | Status: DC
  Administered 2018-08-14 – 2018-08-19 (×6): 0.4 mg via ORAL
  Filled 2018-08-14 (×6): qty 1

## 2018-08-14 MED ORDER — AMLODIPINE BESYLATE 5 MG PO TABS
5.0000 mg | ORAL_TABLET | Freq: Every day | ORAL | Status: DC
  Administered 2018-08-15 – 2018-08-20 (×6): 5 mg via ORAL
  Filled 2018-08-14 (×6): qty 1

## 2018-08-14 MED ORDER — ACETAMINOPHEN 325 MG PO TABS
650.0000 mg | ORAL_TABLET | ORAL | Status: DC | PRN
  Administered 2018-08-15 – 2018-08-20 (×10): 650 mg via ORAL
  Filled 2018-08-14 (×10): qty 2

## 2018-08-14 MED ORDER — ASPIRIN 81 MG PO TBEC: 81.0000 mg | DELAYED_RELEASE_TABLET | Freq: Every day | ORAL | Status: DC

## 2018-08-14 MED ORDER — BENAZEPRIL HCL 10 MG PO TABS
10.0000 mg | ORAL_TABLET | Freq: Every day | ORAL | Status: DC
  Administered 2018-08-15 – 2018-08-20 (×6): 10 mg via ORAL
  Filled 2018-08-14 (×6): qty 1

## 2018-08-14 MED ORDER — MELATONIN 3 MG PO TABS
3.0000 mg | ORAL_TABLET | Freq: Every evening | ORAL | Status: DC | PRN
  Filled 2018-08-14: qty 1

## 2018-08-14 MED ORDER — CLOPIDOGREL BISULFATE 75 MG PO TABS: 75.0000 mg | ORAL_TABLET | Freq: Every day | ORAL | Status: DC

## 2018-08-14 MED ORDER — ATORVASTATIN CALCIUM 40 MG PO TABS: 40.0000 mg | ORAL_TABLET | Freq: Every day | ORAL | Status: DC

## 2018-08-14 MED ORDER — CALCIUM CARBONATE ANTACID 500 MG PO CHEW: 1.0000 | CHEWABLE_TABLET | Freq: Once | ORAL | 0 refills | Status: DC

## 2018-08-14 MED ORDER — MELATONIN 3 MG PO TABS: 3.0000 mg | ORAL_TABLET | Freq: Every evening | ORAL | 0 refills | Status: DC | PRN

## 2018-08-14 MED ORDER — SORBITOL 70 % SOLN
30.0000 mL | Freq: Every day | Status: DC | PRN
  Administered 2018-08-14 – 2018-08-19 (×3): 30 mL via ORAL
  Filled 2018-08-14 (×3): qty 30

## 2018-08-14 MED ORDER — ATORVASTATIN CALCIUM 40 MG PO TABS
40.0000 mg | ORAL_TABLET | Freq: Every day | ORAL | Status: DC
  Administered 2018-08-15 – 2018-08-19 (×5): 40 mg via ORAL
  Filled 2018-08-14 (×5): qty 1

## 2018-08-14 MED ORDER — NICOTINE 21 MG/24HR TD PT24: 21.0000 mg | MEDICATED_PATCH | Freq: Every day | TRANSDERMAL | 0 refills | Status: DC

## 2018-08-14 MED ORDER — NICOTINE 21 MG/24HR TD PT24
21.0000 mg | MEDICATED_PATCH | Freq: Every day | TRANSDERMAL | Status: DC
  Administered 2018-08-15 – 2018-08-20 (×6): 21 mg via TRANSDERMAL
  Filled 2018-08-14 (×6): qty 1

## 2018-08-14 NOTE — Discharge Summary (Addendum)
Stroke Discharge Summary  Patient ID: [name]   MRN: [id number]      DOB: [birth year]  Date of Admission: 08/11/2018 Date of Discharge: 08/14/2018  Attending Physician:  Marvel Plan, MD, Stroke MD Consultant(s):   Maryla Morrow, MD (Physical Medicine & Rehabtilitation)  Patient's PCP:  Toma Deiters, MD  Discharge Diagnoses:  Principal Problem:   Right pontine cerebrovascular accident [facility]) s/p tPA Active Problems:   Hypertension   Diastolic dysfunction   Polysubstance abuse (HCC)   Hyperlipidemia   Tobacco use disorder   Obesity   GERD (gastroesophageal reflux disease)   Leg cramp, left  Past Medical History:  Diagnosis Date  . Acid reflux   . Hypertension   . Kidney stone    Past Surgical History:  Procedure Laterality Date  . CHOLECYSTECTOMY      Medications to be continued on Rehab Allergies as of 08/14/2018      Reactions   Morphine And Related    Altered mental status   Penicillins Hives   Has patient had a PCN reaction causing immediate rash, facial/tongue/throat swelling, SOB or lightheadedness with hypotension: Yes Has patient had a PCN reaction causing severe rash involving mucus membranes or skin necrosis: No Has patient had a PCN reaction that required hospitalization: No Has patient had a PCN reaction occurring within the last 10 years: No If all of the above answers are "NO", then may proceed with Cephalosporin use.      Medication List    STOP taking these medications   ondansetron 4 MG disintegrating tablet Commonly known as:  ZOFRAN-ODT   tamsulosin 0.4 MG Caps capsule Commonly known as:  FLOMAX     TAKE these medications   amLODipine-benazepril 5-10 MG capsule Commonly known as:  LOTREL Take 1 capsule by mouth daily.   aspirin 81 MG EC tablet Take 1 tablet (81 mg total) by mouth daily. Start taking on:  08/15/2018   atorvastatin 40 MG tablet Commonly known as:  LIPITOR Take 1 tablet (40 mg total) by mouth daily at 6 PM.    calcium carbonate 500 MG chewable tablet Commonly known as:  TUMS - dosed in mg elemental calcium Chew 1 tablet (200 mg of elemental calcium total) by mouth once for 1 dose.   clopidogrel 75 MG tablet Commonly known as:  PLAVIX Take 1 tablet (75 mg total) by mouth daily. Start taking on:  08/15/2018   enoxaparin 40 MG/0.4ML injection Commonly known as:  LOVENOX Inject 0.4 mLs (40 mg total) into the skin daily. Start taking on:  08/15/2018   gabapentin 300 MG capsule Commonly known as:  NEURONTIN Take 1 capsule (300 mg total) by mouth every 8 (eight) hours as needed (for leg cramps).   Melatonin 3 MG Tabs Take 1 tablet (3 mg total) by mouth at bedtime as needed (sleep).   multivitamin capsule Take 1 capsule by mouth daily. Gummies   nicotine 21 mg/24hr patch Commonly known as:  NICODERM CQ - dosed in mg/24 hours Place 1 patch (21 mg total) onto the skin daily. Start taking on:  08/15/2018   omeprazole 20 MG tablet Commonly known as:  PRILOSEC OTC Take 20 mg by mouth daily.       LABORATORY STUDIES CBC    Component Value Date/Time   WBC 6.3 08/13/2018 0441   RBC 4.28 08/13/2018 0441   HGB 13.2 08/13/2018 0441   HCT 40.3 08/13/2018 0441   PLT 214 08/13/2018 0441   MCV  94.2 08/13/2018 0441   MCH 30.8 08/13/2018 0441   MCHC 32.8 08/13/2018 0441   RDW 13.7 08/13/2018 0441   LYMPHSABS 1.5 08/11/2018 0024   MONOABS 0.5 08/11/2018 0024   EOSABS 0.1 08/11/2018 0024   BASOSABS 0.1 08/11/2018 0024   CMP    Component Value Date/Time   NA 137 08/13/2018 0441   NA 142 03/06/2014 1729   K 3.8 08/13/2018 0441   CL 105 08/13/2018 0441   CO2 24 08/13/2018 0441   GLUCOSE 112 (H) 08/13/2018 0441   BUN 25 (H) 08/13/2018 0441   BUN 19 03/06/2014 1729   CREATININE 1.07 08/13/2018 0441   CALCIUM 8.7 (L) 08/13/2018 0441   PROT 7.3 08/11/2018 0024   PROT 6.6 02/15/2014 1630   ALBUMIN 4.0 08/11/2018 0024   ALBUMIN 4.3 02/15/2014 1630   AST 24 08/11/2018 0024   ALT 29  08/11/2018 0024   ALKPHOS 62 08/11/2018 0024   BILITOT 0.4 08/11/2018 0024   GFRNONAA >60 08/13/2018 0441   GFRAA >60 08/13/2018 0441   COAGS Lab Results  Component Value Date   INR 0.92 08/11/2018   Lipid Panel    Component Value Date/Time   CHOL 225 (H) 08/11/2018 0831   CHOL 244 (H) 02/15/2014 1630   TRIG 200 (H) 08/11/2018 0831   HDL 33 (L) 08/11/2018 0831   HDL 30 (L) 02/15/2014 1630   CHOLHDL 6.8 08/11/2018 0831   VLDL 40 08/11/2018 0831   LDLCALC 152 (H) 08/11/2018 0831   LDLCALC 168 (H) 02/15/2014 1630   HgbA1C  Lab Results  Component Value Date   HGBA1C 5.6 08/11/2018   Urinalysis    Component Value Date/Time   COLORURINE YELLOW 06/26/2017 1343   APPEARANCEUR HAZY (A) 06/26/2017 1343   LABSPEC 1.031 (H) 06/26/2017 1343   PHURINE 5.0 06/26/2017 1343   GLUCOSEU NEGATIVE 06/26/2017 1343   HGBUR SMALL (A) 06/26/2017 1343   BILIRUBINUR NEGATIVE 06/26/2017 1343   KETONESUR 5 (A) 06/26/2017 1343   PROTEINUR 100 (A) 06/26/2017 1343   UROBILINOGEN 0.2 04/18/2011 1339   NITRITE NEGATIVE 06/26/2017 1343   LEUKOCYTESUR NEGATIVE 06/26/2017 1343   Urine Drug Screen     Component Value Date/Time   LABOPIA POSITIVE (A) 08/12/2018 0201   COCAINSCRNUR POSITIVE (A) 08/12/2018 0201   LABBENZ NONE DETECTED 08/12/2018 0201   AMPHETMU NONE DETECTED 08/12/2018 0201   THCU POSITIVE (A) 08/12/2018 0201   LABBARB NONE DETECTED 08/12/2018 0201    Alcohol Level    Component Value Date/Time   ETH <10 08/11/2018 0024     SIGNIFICANT DIAGNOSTIC STUDIES Ct Angio Head W Or Wo Contrast  Result Date: 08/11/2018 CLINICAL DATA:  57 y/o  M; acute onset left-sided weakness. EXAM: CT ANGIOGRAPHY HEAD AND NECK CT PERFUSION BRAIN TECHNIQUE: Multidetector CT imaging of the head and neck was performed using the standard protocol during bolus administration of intravenous contrast. Multiplanar CT image reconstructions and MIPs were obtained to evaluate the vascular anatomy. Carotid  stenosis measurements (when applicable) are obtained utilizing NASCET criteria, using the distal internal carotid diameter as the denominator. Multiphase CT imaging of the brain was performed following IV bolus contrast injection. Subsequent parametric perfusion maps were calculated using RAPID software. CONTRAST:  ISOVUE-370 IOPAMIDOL (ISOVUE-370) INJECTION 76% COMPARISON:  08/11/2018 CT head. FINDINGS: CTA NECK FINDINGS Aortic arch: Bovine variant branching. Imaged portion shows no evidence of aneurysm or dissection. No significant stenosis of the major arch vessel origins. Right carotid system: No evidence of dissection, stenosis (50% or greater)  or occlusion. Non stenotic calcific atherosclerosis of carotid bifurcation. Left carotid system: No evidence of dissection, stenosis (50% or greater) or occlusion. Non stenotic calcific atherosclerosis of carotid bifurcation. Vertebral arteries: Left dominant vertebral artery. Calcific atherosclerosis of left vertebral artery origin with severe stenosis. Beaded appearance of the left vertebral artery in the neck multiple segments of mild-to-moderate stenosis. Patent normal appearance of the right vertebral artery. Skeleton: Negative. Other neck: Negative. Upper chest: 3 mm right middle fissure perifissural nodule, likely intrapulmonary lymph node. Review of the MIP images confirms the above findings CTA HEAD FINDINGS Anterior circulation: No significant stenosis, proximal occlusion, aneurysm, or vascular malformation. Mixed plaque of carotid siphons with mild stenosis. Posterior circulation: No significant stenosis, proximal occlusion, aneurysm, or vascular malformation. Mild mid basilar stenosis. Venous sinuses: As permitted by contrast timing, patent. Anatomic variants: Large right A1, large anterior communicating artery, diminutive left A1, normal variant. Fetal left PCA. Small patent right posterior communicating artery. Review of the MIP images confirms the  above findings CT Brain Perfusion Findings: CBF (<30%) Volume: 0mL Perfusion (Tmax>6.0s) volume: 0mL Mismatch Volume: 0mL Infarction Location:Negative. IMPRESSION: CTA neck: 1. Patent bilateral carotid systems and right vertebral artery without hemodynamically significant stenosis by NASCET criteria, dissection, or aneurysm. 2. 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 CTA head: 1. No large vessel occlusion, high-grade stenosis, or aneurysm. 2. Mild atherosclerosis of carotid siphons and the basilar artery without significant stenosis. CT brain perfusion: Normal CT brain perfusion. These results were called by telephone at the time of interpretation on 08/11/2018 at 1:36 am to Dr. Preston Fleeting, who verbally acknowledged these results. Electronically Signed   By: Mitzi Hansen M.D.   On: 08/11/2018 01:40   Ct Angio Neck W Or Wo Contrast  Result Date: 08/11/2018 CLINICAL DATA:  57 y/o  M; acute onset left-sided weakness. EXAM: CT ANGIOGRAPHY HEAD AND NECK CT PERFUSION BRAIN TECHNIQUE: Multidetector CT imaging of the head and neck was performed using the standard protocol during bolus administration of intravenous contrast. Multiplanar CT image reconstructions and MIPs were obtained to evaluate the vascular anatomy. Carotid stenosis measurements (when applicable) are obtained utilizing NASCET criteria, using the distal internal carotid diameter as the denominator. Multiphase CT imaging of the brain was performed following IV bolus contrast injection. Subsequent parametric perfusion maps were calculated using RAPID software. CONTRAST:  ISOVUE-370 IOPAMIDOL (ISOVUE-370) INJECTION 76% COMPARISON:  08/11/2018 CT head. FINDINGS: CTA NECK FINDINGS Aortic arch: Bovine variant branching. Imaged portion shows no evidence of aneurysm or  dissection. No significant stenosis of the major arch vessel origins. Right carotid system: No evidence of dissection, stenosis (50% or greater) or occlusion. Non stenotic calcific atherosclerosis of carotid bifurcation. Left carotid system: No evidence of dissection, stenosis (50% or greater) or occlusion. Non stenotic calcific atherosclerosis of carotid bifurcation. Vertebral arteries: Left dominant vertebral artery. Calcific atherosclerosis of left vertebral artery origin with severe stenosis. Beaded appearance of the left vertebral artery in the neck multiple segments of mild-to-moderate stenosis. Patent normal appearance of the right vertebral artery. Skeleton: Negative. Other neck: Negative. Upper chest: 3 mm right middle fissure perifissural nodule, likely intrapulmonary lymph node. Review of the MIP images confirms the above findings CTA HEAD FINDINGS Anterior circulation: No significant stenosis, proximal occlusion, aneurysm, or vascular malformation. Mixed plaque of carotid siphons with mild stenosis. Posterior circulation: No significant stenosis, proximal occlusion, aneurysm, or vascular malformation. Mild mid basilar  stenosis. Venous sinuses: As permitted by contrast timing, patent. Anatomic variants: Large right A1, large anterior communicating artery, diminutive left A1, normal variant. Fetal left PCA. Small patent right posterior communicating artery. Review of the MIP images confirms the above findings CT Brain Perfusion Findings: CBF (<30%) Volume: 0mL Perfusion (Tmax>6.0s) volume: 0mL Mismatch Volume: 0mL Infarction Location:Negative. IMPRESSION: CTA neck: 1. Patent bilateral carotid systems and right vertebral artery without hemodynamically significant stenosis by NASCET criteria, dissection, or aneurysm. 2. 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 CTA head: 1. No large vessel occlusion, high-grade stenosis, or aneurysm. 2. Mild atherosclerosis of carotid siphons and the basilar artery without significant stenosis. CT brain perfusion: Normal CT brain perfusion. These results were called by telephone at the time of interpretation on 08/11/2018 at 1:36 am to Dr. Preston Fleeting, who verbally acknowledged these results. Electronically Signed   By: Mitzi Hansen M.D.   On: 08/11/2018 01:40   Mr Brain Wo Contrast  Result Date: 08/11/2018 CLINICAL DATA:  Left-sided weakness. EXAM: MRI HEAD WITHOUT CONTRAST TECHNIQUE: Multiplanar, multiecho pulse sequences of the brain and surrounding structures were obtained without intravenous contrast. COMPARISON:  Head CT, CTA, and cerebral perfusion 08/11/2018 FINDINGS: Brain: An acute to early subacute right paramedian pontine infarct measures 12 mm. No intracranial hemorrhage, mass, midline shift, or extra-axial fluid collection is identified. The ventricles and sulci are normal. Scattered small foci of T2 hyperintensity in the cerebral white matter and pons separate from the infarct are nonspecific but compatible with mild chronic small vessel ischemic disease. Vascular: Major intracranial vascular flow voids are preserved. Skull and upper cervical spine: Unremarkable bone marrow signal. Sinuses/Orbits: Unremarkable orbits. Mild bilateral ethmoid air cell mucosal thickening. Trace mastoid effusions. Other: None. IMPRESSION: 1. Acute pontine infarct. 2. Mild chronic small vessel ischemic disease. Electronically Signed   By: Sebastian Ache M.D.   On: 08/11/2018 17:24   Ct Cerebral Perfusion W Contrast  Result Date: 08/11/2018 CLINICAL DATA:  57 y/o  M; acute onset left-sided weakness. EXAM: CT ANGIOGRAPHY HEAD AND NECK CT PERFUSION BRAIN TECHNIQUE: Multidetector CT imaging of the head and neck was performed using the standard protocol during bolus administration of intravenous contrast.  Multiplanar CT image reconstructions and MIPs were obtained to evaluate the vascular anatomy. Carotid stenosis measurements (when applicable) are obtained utilizing NASCET criteria, using the distal internal carotid diameter as the denominator. Multiphase CT imaging of the brain was performed following IV bolus contrast injection. Subsequent parametric perfusion maps were calculated using RAPID software. CONTRAST:  ISOVUE-370 IOPAMIDOL (ISOVUE-370) INJECTION 76% COMPARISON:  08/11/2018 CT head. FINDINGS: CTA NECK FINDINGS Aortic arch: Bovine variant branching. Imaged portion shows no evidence of aneurysm or dissection. No significant stenosis of the major arch vessel origins. Right carotid system: No evidence of dissection, stenosis (50% or greater) or occlusion. Non stenotic calcific atherosclerosis of carotid bifurcation. Left carotid system: No evidence of dissection, stenosis (50% or greater) or occlusion. Non stenotic calcific atherosclerosis of carotid bifurcation. Vertebral arteries: Left dominant vertebral artery. Calcific atherosclerosis of left vertebral artery origin with severe stenosis. Beaded appearance of the left vertebral artery in the neck multiple segments of mild-to-moderate stenosis. Patent normal appearance of the right vertebral artery. Skeleton: Negative. Other neck: Negative. Upper chest: 3 mm right middle fissure perifissural nodule, likely intrapulmonary lymph node. Review of the MIP images confirms the above findings CTA HEAD FINDINGS Anterior circulation: No  significant stenosis, proximal occlusion, aneurysm, or vascular malformation. Mixed plaque of carotid siphons with mild stenosis. Posterior circulation: No significant stenosis, proximal occlusion, aneurysm, or vascular malformation. Mild mid basilar stenosis. Venous sinuses: As permitted by contrast timing, patent. Anatomic variants: Large right A1, large anterior communicating artery, diminutive left A1, normal variant. Fetal  left PCA. Small patent right posterior communicating artery. Review of the MIP images confirms the above findings CT Brain Perfusion Findings: CBF (<30%) Volume: 0mL Perfusion (Tmax>6.0s) volume: 0mL Mismatch Volume: 0mL Infarction Location:Negative. IMPRESSION: CTA neck: 1. Patent bilateral carotid systems and right vertebral artery without hemodynamically significant stenosis by NASCET criteria, dissection, or aneurysm. 2. 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 CTA head: 1. No large vessel occlusion, high-grade stenosis, or aneurysm. 2. Mild atherosclerosis of carotid siphons and the basilar artery without significant stenosis. CT brain perfusion: Normal CT brain perfusion. These results were called by telephone at the time of interpretation on 08/11/2018 at 1:36 am to Dr. Preston Fleeting, who verbally acknowledged these results. Electronically Signed   By: Mitzi Hansen M.D.   On: 08/11/2018 01:40   Ct Head Code Stroke Wo Contrast  Result Date: 08/11/2018 CLINICAL DATA:  Code stroke.  Sudden onset left-sided weakness. EXAM: CT HEAD WITHOUT CONTRAST TECHNIQUE: Contiguous axial images were obtained from the base of the skull through the vertex without intravenous contrast. COMPARISON:  06/24/2018 CT head FINDINGS: Brain: No evidence of acute infarction, hemorrhage, hydrocephalus, extra-axial collection or mass lesion/mass effect. Vascular: Asymmetric mildly increased density within the right terminal ICA may represent thrombus (series 4, image 27). Skull: Normal. Negative for fracture or focal lesion. Sinuses/Orbits: No acute finding. Other: None. ASPECTS [facility] Stroke Program Early CT Score) - Ganglionic level infarction (caudate, lentiform nuclei, internal capsule, insula, M1-M3 cortex): 7 - Supraganglionic infarction (M4-M6  cortex): 3 Total score (0-10 with 10 being normal): 10 IMPRESSION: 1. No acute abnormality of the brain identified. 2. Asymmetric mildly increased density in right terminal ICA may reflect thrombus. 3. ASPECTS is 10 These results were called by telephone at the time of interpretation on 08/11/2018 at 12:34 am to Dr. Dione Booze , who verbally acknowledged these results. Electronically Signed   By: Mitzi Hansen M.D.   On: 08/11/2018 00:38    2D Echocardiogram  - Left ventricle: The cavity size was normal. Wall thickness was normal. Systolic function was normal. The estimated ejection fraction was in the range of 60% to 65%. Wall motion was normal; there were no regional wall motion abnormalities. Doppler parameters are consistent with abnormal left ventricular relaxation (grade 1 diastolic dysfunction). The E/e&' ratio is between 8-15, suggesting indeterminate LV filling pressure. - Mitral valve: Mildly thickened leaflets . There was trivial regurgitation. - Atrial septum: No defect or patent foramen ovale was identified. - Inferior vena cava: The vessel was normal in size. The respirophasic diameter changes were in the normal range (>= 50%), consistent with normal central venous pressure. Impressions: LVEF 60-65%, normal wall thickness, normal wall motion, grade 1 DD, indeterminate LV filling pressure, trivial MR, normal IVC.     HISTORY OF PRESENT ILLNESS [name] is a 57 y.o. male with a history of hypertension and tobacco abuse who was in his normal state of health earlier tonight.  He went and laid down getting ready for bed and then when he wanted to get back up he was unable to.  Due to this, EMS activated and he was taken to [facility] where tele-neurology was activated and administered IV TPA. LKW 08/11/2018 at 11 PM. Modified Rankin Scale: 0-Completely asymptomatic and back to baseline post- stroke. He was admitted for further evaluation and treatment.    HOSPITAL  COURSE [name] is a 57 y.o. male with history of HTN and tobacco abuse presenting with L hemiparesis. Received IV tPA 08/11/2017 at 0056. R pontine infarct d/t small vessel disease. Risk factors: HTN, tobacco abuse, cocaine use, THC use. Therapy recommends CIR. Pt discharged their for ongoing PT, OT.  Stroke:  right pontine infarct s/p tPA, likely due to small vessel disease given risk factors of hypertension, smoking, cocaine and THC use  Code Stroke CT head No acute stroke. Asymmetry R terminal ICA ? Thrombus. ASPECTS 10.     CTA head no LVO. Mild atherosclerosis   CTA neck extensive atherosclerosis L VA, severe stenosis/near occlusion  CT perfusion normal  MRI right pontine infarct  2D Echo EF 60 to 65%  LDL 152  HgbA1c 5.6  HIV/RPR negative  UDS positive for cocaine, THC and opiates  No antithrombotic prior to admission, now on aspirin 81 and Plavix 75.  Recommend DAPT for 3 weeks and then aspirin alone  Therapy recommendations: CIR  Disposition:  CIR   Hypertensive Emergency  BP as high as 160/115   Home meds:  Amlodipine-benezepril 5-10 daily  Treated with Cardene in the ICU  Resumed home meds  Long-term BP goal normotensive  Hyperlipidemia  Home meds none  LDL 152, goal less than 70  On Lipitor 40  Continue statin on discharge  Cocaine abuse  UDS positive for cocaine  Pt denies cocaine use  Tobacco abuse  Current smoker  Smoking cessation counseling provided  Nicotine patch provided  Pt is willing to quit  Other Stroke Risk Factors  Obesity, Body mass index is 31.94 kg/m., recommend weight loss, diet and exercise as appropriate   THC use, UDS positive for THC  Opiate use, UDS positive.  Other Active Problems  Reflux on prilosec  BPH on flomax  L leg cramps. Add gabapentin prn  DISCHARGE EXAM Blood pressure (!) 150/94, pulse 96, temperature 98.6 F (37 C), temperature source Oral, resp. rate (!) 21, height  5\' 7"  (1.702 m), weight 92.5 kg, SpO2 97 %. General - Well nourished, well developed, in no apparent distress.  Cardiovascular - Regular rate and rhythm.  Mental Status -  Level of arousal and orientation to time, place, and person were intact. Language including expression, naming, repetition, comprehension was assessed and found intact. Fund of Knowledge was assessed and was intact.  Cranial Nerves II - XII - II - Visual field intact OU. III, IV, VI - Extraocular movements intact. V - Facial sensation intact bilaterally. VII - face symmetric VIII - Hearing & vestibular intact bilaterally. X - Palate elevates symmetrically. XI - Chin turning & shoulder shrug intact bilaterally. XII - Tongue protrusion intact.  Motor Strength - The patient's strength was normal in RUE and RLE, however, 4/5 LUE proximal, 2+/5 wrist extension and finger movement, 4+/5 LLE proximal, 4/5 distally. Bulk was normal and fasciculations were absent.   Motor Tone - Muscle tone was assessed at the neck and appendages and was normal.  Sensory - Light touch, temperature/pinprick were assessed and were symmetrical.    Coordination - The patient had normal movements in the right hand with no ataxia or dysmetria.  Tremor was absent.  Gait and Station - deferred.  Discharge Diet  Heart healthy thin liquids  DISCHARGE PLAN  Disposition:  Transfer to [facility] for ongoing PT, OT and ST  aspirin 81 mg daily and clopidogrel 75 mg daily for secondary stroke prevention x 3 weeks then aspirin alone  Recommend ongoing risk factor control by Primary Care Physician at time of discharge from inpatient rehabilitation.  Follow-up Hasanaj, Myra Gianotti, MD in 2 weeks following discharge from rehab.  Follow-up in [facility] in 4 weeks following discharge from rehab, office to schedule an appointment.   30 minutes were spent preparing discharge.  Annie Main, MSN,  APRN, ANVP-BC, AGPCNP-BC Advanced Practice Stroke Nurse [facility] See Amion for Schedule & Pager information 08/14/2018 3:10 PM    ATTENDING NOTE: I reviewed above note and agree with the assessment and plan. Pt was seen and examined.   No acute event overnight.  Left sided weakness much improved.  Still has mild left upper extremity weakness.  PT/OT recommend inpatient rehab.  Continue DAPT for 3 weeks and then aspirin alone.  Continue statin.  Patient educated on quit smoking and illicit drugs.  Ready for CIR discharge.  Marvel Plan, MD PhD Stroke Neurology 08/14/2018 6:01 PM

## 2018-08-14 NOTE — Progress Notes (Signed)
Physical Therapy Treatment Patient Details Name: [name] MRN: [id number] DOB: June 04, 1961 Today's Date: 08/14/2018    History of Present Illness 57 year old male admitted 08/11/18 with left sided weakness. TPA given. PMH: GERD, HTN, kidney stones, + tobacco use. 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    PT Comments    Pt performed gait with RW and grip strength is improved to use RW.  Pt presents with poor safety, coordination deficits and strength deficits during session.  He continues to benefit from skilled rehab in  A post acute setting for intensive rehab before returning home.      Follow Up Recommendations  CIR     Equipment Recommendations  (TBD)    Recommendations for Other Services Rehab consult     Precautions / Restrictions Precautions Precautions: Fall Restrictions Weight Bearing Restrictions: No    Mobility  Bed Mobility Overal bed mobility: Needs Assistance Bed Mobility: Supine to Sit;Sit to Supine     Supine to sit: Supervision Sit to supine: Supervision   General bed mobility comments: supervision for safety, increased time and effort  Transfers Overall transfer level: Needs assistance Equipment used: Rolling walker (2 wheeled) Transfers: Sit to/from Stand Sit to Stand: Min assist         General transfer comment: Cues for hand placement to and from seated surface.  Pt pulls on RW into standing.    Ambulation/Gait Ambulation/Gait assistance: Mod assist Gait Distance (Feet): 60 Feet Assistive device: Rolling walker (2 wheeled) Gait Pattern/deviations: Decreased stride length;Decreased weight shift to left;Shuffle;Staggering left;Step-through pattern;Ataxic;Narrow base of support Gait velocity: decreased   General Gait Details: Pt required cues to increased BOS and to step R foot to R side of R, cues for foot clearance on L, slight foot drop on L with decreased dorsiflexion  during swing phase.     Stairs             Wheelchair Mobility    Modified Rankin (Stroke Patients Only)       Balance Overall balance assessment: Needs assistance   Sitting balance-Leahy Scale: Fair       Standing balance-Leahy Scale: Poor                              Cognition Arousal/Alertness: Awake/alert Behavior During Therapy: WFL for tasks assessed/performed Overall Cognitive Status: Within Functional Limits for tasks assessed                                        Exercises General Exercises - Lower Extremity Ankle Circles/Pumps: AAROM;10 reps;Supine;Left;PROM Long Arc Quad: AROM;Left;10 reps;Supine Heel Slides: AAROM;Left;10 reps;Supine Hip Flexion/Marching: AROM;Left;10 reps;Supine    General Comments        Pertinent Vitals/Pain Pain Assessment: 0-10 Pain Score: 4  Pain Location: R arm Pain Descriptors / Indicators: Tingling Pain Intervention(s): Monitored during session;Repositioned;Ice applied    Home Living                      Prior Function            PT Goals (current goals can now be found in the care plan section) Acute Rehab PT Goals Patient Stated Goal: to get better Potential to Achieve Goals: Good Progress towards PT goals: Progressing toward goals  Frequency    Min 4X/week      PT Plan Current plan remains appropriate    Co-evaluation              AM-PAC PT "6 Clicks" Daily Activity  Outcome Measure  Difficulty turning over in bed (including adjusting bedclothes, sheets and blankets)?: A Little Difficulty moving from lying on back to sitting on the side of the bed? : A Little Difficulty sitting down on and standing up from a chair with arms (e.g., wheelchair, bedside commode, etc,.)?: Unable Help needed moving to and from a bed to chair (including a wheelchair)?: A Little Help needed walking in hospital room?: A Lot Help needed climbing 3-5 steps with a railing? :  A Lot 6 Click Score: 14    End of Session Equipment Utilized During Treatment: Gait belt Activity Tolerance: Patient tolerated treatment well Patient left: in chair;with call bell/phone within reach;with family/visitor present   PT Visit Diagnosis: Unsteadiness on feet (R26.81);Other symptoms and signs involving the nervous system (R29.898)     Time: 2130-8657 PT Time Calculation (min) (ACUTE ONLY): 20 min  Charges:  $Gait Training: 8-22 mins                     Joycelyn Rua, PTA Acute Rehabilitation Services Pager [phone] Office [phone]     [name] Artis Delay 08/14/2018, 5:11 PM

## 2018-08-14 NOTE — IPOC Note (Signed)
Overall Plan of Care [facility]) Patient Details Name: [name] MRN: [id number] DOB: 1961/08/14  Admitting Diagnosis: Right pontine infarct  Hospital Problems: Active Problems:   Right pontine cerebrovascular accident [facility]) s/p tPA   CKD (chronic kidney disease), stage II   Benign prostatic hyperplasia     Functional Problem List: Nursing Behavior, Bowel, Bladder, Endurance, Medication Management, Motor, Safety  PT Balance, Behavior, Edema, Endurance, Motor, Pain, Safety  OT Balance, Motor  SLP    TR         Basic ADL's: OT Grooming, Bathing, Dressing     Advanced  ADL's: OT       Transfers: PT Bed Mobility, Bed to Chair, Car, Furniture, Floor  OT Tub/Shower, Technical brewer: PT Ambulation, Psychologist, prison and probation services, Stairs     Additional Impairments: OT Fuctional Use of Upper Extremity  SLP        TR      Anticipated Outcomes Item Anticipated Outcome  Self Feeding modified independent  Swallowing      Basic self-care  modified independent  Toileting  modified independent   Bathroom Transfers modified independent  Bowel/Bladder  maintain regular emptying of bowels and bladder.   Transfers  Mod I  Locomotion  supervision  Communication     Cognition     Pain  less than 3  Safety/Judgment  Will remain free of falls, skin breakdown and infection   Therapy Plan: PT Intensity: Minimum of 1-2 x/day ,45 to 90 minutes PT Frequency: 5 out of 7 days PT Duration Estimated Length of Stay: 7-10 days OT Intensity: Minimum of 1-2 x/day, 45 to 90 minutes OT Frequency: 5 out of 7 days OT Duration/Estimated Length of Stay: 5-7 days      Team Interventions: Nursing Interventions Patient/Family Education, Bladder Management, Bowel Management, Pain Management, Discharge Planning, Psychosocial Support  PT interventions Ambulation/gait training, Stair training, Warden/ranger, DME/adaptive equipment instruction, Patient/family education, Therapeutic  Activities, Wheelchair propulsion/positioning, Cognitive remediation/compensation, Psychosocial support, Therapeutic Exercise, Functional mobility training, UE/LE Strength taining/ROM, Skin care/wound management, Discharge planning, Neuromuscular re-education, UE/LE Coordination activities  OT Interventions Warden/ranger, Community reintegration, Fish farm manager, Discharge planning, Disease mangement/prevention, Pain management, Self Care/advanced ADL retraining, Therapeutic Activities, Functional electrical stimulation, Functional mobility training, Patient/family education, Therapeutic Exercise, UE/LE Coordination activities, UE/LE Strength taining/ROM, Neuromuscular re-education  SLP Interventions    TR Interventions    SW/CM Interventions Discharge Planning, Psychosocial Support, Patient/Family Education   Barriers to Discharge MD  Medical stability  Nursing      PT      OT      SLP      SW       Team Discharge Planning: Destination: PT-Home ,OT- Home , SLP-  Projected Follow-up: PT-Outpatient PT, OT-  Outpatient OT, SLP-  Projected Equipment Needs: PT-To be determined, OT- To be determined, SLP-  Equipment Details: PT- , OT-  Patient/family involved in discharge planning: PT- Patient, Family member/caregiver,  OT-Patient, SLP-   MD ELOS: 5-7 days. Medical Rehab Prognosis:  Good Assessment: [name] is a 57 year old right-handed male history of hypertension and tobacco abuse.  Presented 08/11/2018 with left-sided weakness of acute onset.  Cranial CT scan reviewed, unremarkable for acute intracranial process.  Patient did receive TPA.  Urine drug screen positive cocaine and marijuana.  CT angiogram of head and neck showed no large vessel occlusion high-grade stenosis or aneurysm.  MRI showed acute pontine infarction.  Echocardiogram with ejection fraction of 65% grade 1 diastolic dysfunction.  No defect or PFO identified.  Neurology consulted  maintained on aspirin and Plavix x3 weeks then aspirin alone. Patient with resulting functional deficits with mobility, transfers, self-care.  Will set goals for 5-7 days with PT/OT.  See Team Conference Notes for weekly updates to the plan of care

## 2018-08-14 NOTE — Progress Notes (Signed)
Patient arrived to the unit and oriented to routines. Verbalized willingness to call and wait for assistance.

## 2018-08-14 NOTE — Progress Notes (Signed)
Standley Brooking, RN  Rehab Admission Coordinator  Physical Medicine and Rehabilitation  PMR Pre-admission  Addendum  Date of Service:  08/14/2018 3:10 PM       Related encounter: ED to Hosp-Admission (Current) from 08/11/2018 in [place] 3W Progressive Care         Show:Clear all [x] Manual[x] Template[x] Copied  Added by: [x] Standley Brooking, RN  [] Hover for details PMR Admission Coordinator Pre-Admission Assessment  Patient: [name] is an 57 y.o., male MRN: [id number] DOB: June 05, 1961 Height: 5\' 7"  (170.2 cm) Weight: 92.5 kg                                                                                                                                                  Insurance Information  PRIMARY: uninsured       I advised wife to apply for disability and Medicaid with [place] [facility]  Medicaid Application Date:       Case Manager:  Disability Application Date:       Case Worker:   Emergency Chief Operating Officer Information    Name Relation Home Work Mobile   [place] [phone]  [phone]     Current Medical History  Patient Admitting Diagnosis: acute pontine infarction  History of Present Illness:[name] is a 57 year old right-handed male history of hypertension and tobacco abuse. Presented 08/11/2018 with left-sided weakness of acute onset. Cranial CT scan reviewed, unremarkable for acute intracranial process. Patient did receive TPA. Urine drug screen positive cocaine and marijuana. CT angiogram of head and neck showed no large vessel occlusion high-grade stenosis or aneurysm. MRI showed acute pontine infarction. Echocardiogram with ejection fraction of 65% grade 1 diastolic dysfunction. No defect or PFO identified. Neurology consulted maintained on aspirin and Plavix x3 weeks then aspirin alone. Subtends Lovenox for DVT prophylaxis.   Complete NIHSS TOTAL: 7  Past Medical History      Past  Medical History:  Diagnosis Date  . Acid reflux   . Hypertension   . Kidney stone     Family History  family history includes Diabetes in his father; Heart disease in his father; Hyperlipidemia in his father; Hypertension in his father.  Prior Rehab/Hospitalizations:  Has the patient had major surgery during 100 days prior to admission? No  Current Medications   Current Facility-Administered Medications:  .  acetaminophen (TYLENOL) tablet 650 mg, 650 mg, Oral, Q4H PRN, 650 mg at 08/14/18 1030 **OR** [DISCONTINUED] acetaminophen (TYLENOL) solution 650 mg, 650 mg, Per Tube, Q4H PRN **OR** [DISCONTINUED] acetaminophen (TYLENOL) suppository 650 mg, 650 mg, Rectal, Q4H PRN, Rejeana Brock, MD .  amLODipine (NORVASC) tablet 5 mg, 5 mg, Oral, Daily, Delia Heady S, MD, 5 mg at 08/14/18 1030 .  aspirin EC tablet 81 mg, 81 mg, Oral, Daily, Marvel Plan, MD, 81 mg at  08/14/18 1030 .  atorvastatin (LIPITOR) tablet 40 mg, 40 mg, Oral, q1800, Marvel Plan, MD, 40 mg at 08/13/18 1810 .  benazepril (LOTENSIN) tablet 10 mg, 10 mg, Oral, Daily, Micki Riley, MD, 10 mg at 08/14/18 1029 .  calcium carbonate (TUMS - dosed in mg elemental calcium) chewable tablet 200 mg of elemental calcium, 1 tablet, Oral, Once, Rejeana Brock, MD .  clopidogrel (PLAVIX) tablet 75 mg, 75 mg, Oral, Daily, Marvel Plan, MD, 75 mg at 08/14/18 1030 .  enoxaparin (LOVENOX) injection 40 mg, 40 mg, Subcutaneous, Q24H, Marvel Plan, MD, 40 mg at 08/14/18 1030 .  gabapentin (NEURONTIN) capsule 300 mg, 300 mg, Oral, Q8H PRN, Annie Main L, NP, 300 mg at 08/11/18 0944 .  labetalol (NORMODYNE,TRANDATE) injection 10-20 mg, 10-20 mg, Intravenous, Q2H PRN **AND** [DISCONTINUED] nicardipine (CARDENE) 20mg  in 0.86% saline IV infusion (0.1 mg/ml), 0-15 mg/hr, Intravenous, Continuous PRN, Rejeana Brock, MD .  Melatonin TABS 3 mg, 3 mg, Oral, QHS PRN, Marvel Plan, MD, 3 mg at 08/12/18 2227 .  nicotine  (NICODERM CQ - dosed in mg/24 hours) patch 21 mg, 21 mg, Transdermal, Daily, Biby, Sharon L, NP, 21 mg at 08/14/18 1031 .  omeprazole (PRILOSEC) capsule 20 mg, 20 mg, Oral, BID AC, Biby, Sharon L, NP, 20 mg at 08/14/18 0830 .  ondansetron (ZOFRAN) injection 4 mg, 4 mg, Intravenous, Q4H PRN, Rejeana Brock, MD, 4 mg at 08/11/18 1453 .  tamsulosin (FLOMAX) capsule 0.4 mg, 0.4 mg, Oral, QHS, Biby, Sharon L, NP, 0.4 mg at 08/13/18 2039  Patients Current Diet:     Diet Order                  Diet Heart Room service appropriate? Yes; Fluid consistency: Thin  Diet effective now               Precautions / Restrictions Precautions Precautions: Fall Restrictions Weight Bearing Restrictions: No   Has the patient had 2 or more falls or a fall with injury in the past year?No  Prior Activity Level Community (5-7x/wk): very active; drives a dump truck  Journalist, newspaper / Corporate investment banker Devices/Equipment: None Home Equipment: None  Prior Device Use: Indicate devices/aids used by the patient prior to current illness, exacerbation or injury? None of the above  Prior Functional Level Prior Function Level of Independence: Independent Comments: working and driving   Self Care: Did the patient need help bathing, dressing, using the toilet or eating?  Independent  Indoor Mobility: Did the patient need assistance with walking from room to room (with or without device)? Independent  Stairs: Did the patient need assistance with internal or external stairs (with or without device)? Independent  Functional Cognition: Did the patient need help planning regular tasks such as shopping or remembering to take medications? Independent  Current Functional Level Cognition  Arousal/Alertness: Awake/alert Overall Cognitive Status: Within Functional Limits for tasks assessed Orientation Level: Oriented X4 Attention: Focused, Sustained, Selective Focused  Attention: Appears intact Sustained Attention: Appears intact Selective Attention: Impaired Selective Attention Impairment: Verbal basic Memory: Impaired Memory Impairment: Retrieval deficit, Decreased short term memory Decreased Short Term Memory: Verbal basic    Extremity Assessment (includes Sensation/Coordination)  Upper Extremity Assessment: LUE deficits/detail LUE Deficits / Details: shoulder FF 3/5, elbow 3/5, forearm 3-/5, wrist 3-/5. hand 3-/5; able to use as gross stabilizer (noted improved grasp and wrist contro.) LUE Sensation: WNL LUE Coordination: decreased fine motor, decreased gross motor  Lower Extremity Assessment: Defer  to PT evaluation LLE Deficits / Details: assymetrical weakness noted in Left LE. Functional 3+/5  but fatigues easily and gives way LLE Coordination: decreased fine motor, decreased gross motor    ADLs  Overall ADL's : Needs assistance/impaired Grooming: Set up, Sitting Upper Body Bathing: Minimal assistance, Sitting Lower Body Bathing: Moderate assistance, Sit to/from stand Upper Body Dressing : Minimal assistance, Sitting Lower Body Dressing: Maximal assistance, Sit to/from stand Toilet Transfer: Minimal assistance, Stand-pivot(simulated in room) Toileting- Clothing Manipulation and Hygiene: Moderate assistance, Sit to/from stand Functional mobility during ADLs: Minimal assistance, Cueing for safety General ADL Comments: Pt session focused on exercises to L UE, patient completed stand pivot to transition to [employer] at completion of session.    Mobility  Overal bed mobility: Needs Assistance Bed Mobility: Supine to Sit, Sit to Supine Supine to sit: Supervision Sit to supine: Supervision General bed mobility comments: supervision for safety, increased time and effort    Transfers  Overall transfer level: Needs assistance Equipment used: 1 person hand held assist Transfers: Sit to/from Stand Sit to Stand: Min assist General transfer  comment: min assist for balance/stability when transitioning to standing    Ambulation / Gait / Stairs / Wheelchair Mobility  Ambulation/Gait Ambulation/Gait assistance: Mod assist Gait Distance (Feet): 22 Feet Assistive device: 1 person hand held assist Gait Pattern/deviations: Step-to pattern, Decreased stride length, Decreased weight shift to left, Shuffle, Staggering left General Gait Details: Patient cued for quad setting during loading response, noted extreme genu recurvatum during transition. Attempted HHA via LUE briefly but unable to maintain without significant loss of control.  Gait velocity: decreased Gait velocity interpretation: <1.31 ft/sec, indicative of household ambulator    Posture / Balance Balance Overall balance assessment: Needs assistance Sitting-balance support: No upper extremity supported, Feet supported Sitting balance-Leahy Scale: Fair Standing balance support: Single extremity supported, During functional activity Standing balance-Leahy Scale: Poor Standing balance comment: reliance on external support    Special needs/care consideration BiPAP/CPAP n/a CPM n/a Continuous Drip IV n/a Dialysis n/a Life Vest n/a Oxygen n/a Special Bed n/a Trach Size n/a Wound Vac n/a Skin scratches to bilateral LE Bowel mgmt: no LBM documented Bladder mgmt: continent Diabetic mgmt Hgb A1c 5.6   Previous Home Environment Living Arrangements: Spouse/significant other  Lives With: Spouse Available Help at Discharge: Family, Available 24 hours/day Type of Home: Mobile home Home Layout: One level Home Access: Stairs to enter Entrance Stairs-Rails: None Entrance Stairs-Number of Steps: 3 Bathroom Shower/Tub: Associate Professor: Yes How Accessible: Accessible via walker Home Care Services: No  Discharge Living Setting Plans for Discharge Living Setting: Patient's home, Lives with (comment), Mobile  Home(wife) Type of Home at Discharge: Mobile home Discharge Home Layout: One level Discharge Home Access: Stairs to enter Entrance Stairs-Rails: None Entrance Stairs-Number of Steps: 3 Discharge Bathroom Shower/Tub: Tub/shower unit, Curtain Discharge Bathroom Toilet: Standard Discharge Bathroom Accessibility: Yes How Accessible: Accessible via walker Does the patient have any problems obtaining your medications?: Yes (Describe)(uninsured)  Social/Family/Support Systems Patient Roles: Spouse, Parent(employee) Contact Information: wife, Dois Daven[place] Anticipated Caregiver: wife Anticipated Industrial/product designer Information: see above Ability/Limitations of Caregiver: no limitations Caregiver Availability: 24/7 Discharge Plan Discussed with Primary Caregiver: Yes Is Caregiver In Agreement with Plan?: Yes Does Caregiver/Family have Issues with Lodging/Transportation while Pt is in Rehab?: No  Goals/Additional Needs Patient/Family Goal for Rehab: supervision with PT and OT Expected length of stay: ELOS 8 to 12 days; pt wants shorter LOS due to cost Pt/Family Agrees to Admission and willing  to participate: Yes Program Orientation Provided & Reviewed with Pt/Caregiver Including Roles  & Responsibilities: Yes  Barriers to Discharge: Insurance for SNF coverage  Decrease burden of Care through IP rehab admission: n/a  Possible need for SNF placement upon discharge:not anticipated  Patient Condition: This patient's condition remains as documented in the consult dated 08/14/2018, in which the Rehabilitation Physician determined and documented that the patient's condition is appropriate for intensive rehabilitative care in an inpatient rehabilitation facility. Will admit to inpatient rehab today.  Preadmission Screen Completed By:  Clois Dupes, 08/14/2018 3:10 PM ______________________________________________________________________   Discussed status with Dr. Allena Katz on 08/14/2018 at   1514 and received telephone approval for admission today.  Admission Coordinator:  Clois Dupes, time 1610 Date 08/14/2018       Cosigned by: Marcello Fennel, MD at 08/14/2018 4:27 PM  Revision History

## 2018-08-14 NOTE — H&P (Signed)
Physical Medicine and Rehabilitation Admission H&P    Chief Complaint  Patient presents with  . Code Stroke  : HPI: [name] is a 57 year old right-handed male history of hypertension and tobacco abuse.  Per chart review, patient, and wife, patient lives with spouse.  Mobile home 3 steps to entry.  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  Wife can assist as needed.  Presented 08/11/2018 with left-sided weakness of acute onset.  Cranial CT scan reviewed, unremarkable for acute intracranial process.  Patient did receive TPA.  Urine drug screen positive cocaine and marijuana.  CT angiogram of head and neck showed no large vessel occlusion high-grade stenosis or aneurysm.  MRI showed acute pontine infarction.  Echocardiogram with ejection fraction of 56% grade 1 diastolic dysfunction.  No defect or PFO identified.  Neurology consulted maintained on aspirin and Plavix x3 weeks then aspirin alone.  Subtends Lovenox for DVT prophylaxis.  Therapy evaluations completed with recommendations of physical medicine rehab consult.  Patient was admitted for a comprehensive rehab program.  Review of Systems  Constitutional: Negative for chills and fever.  HENT: Negative for hearing loss.   Eyes: Negative for blurred vision and double vision.  Respiratory: Negative for cough and shortness of breath.   Cardiovascular: Negative for chest pain and leg swelling.  Gastrointestinal: Positive for constipation. Negative for nausea and vomiting.       GERD  Genitourinary: Positive for urgency. Negative for flank pain and hematuria.  Musculoskeletal: Positive for myalgias.  Skin: Negative for rash.  Neurological: Positive for focal weakness and headaches.  All other systems reviewed and are negative.  Past Medical History:  Diagnosis Date  . Acid reflux   . Hypertension   . Kidney stone    Past Surgical History:  Procedure Laterality Date  . CHOLECYSTECTOMY     Family History    Problem Relation Age of Onset  . Hypertension Father   . Heart disease Father   . Hyperlipidemia Father   . Diabetes Father    Social History:  reports that he has been smoking cigarettes. He has been smoking about 2.00 packs per day. He has never used smokeless tobacco. He reports that he does not drink alcohol or use drugs. Allergies:  Allergies  Allergen Reactions  . Morphine And Related     Altered mental status  . Penicillins Hives    Has patient had a PCN reaction causing immediate rash, facial/tongue/throat swelling, SOB or lightheadedness with hypotension: Yes Has patient had a PCN reaction causing severe rash involving mucus membranes or skin necrosis: No Has patient had a PCN reaction that required hospitalization: No Has patient had a PCN reaction occurring within the last 10 years: No If all of the above answers are "NO", then may proceed with Cephalosporin use.    Medications Prior to Admission  Medication Sig Dispense Refill  . amLODipine-benazepril (LOTREL) 5-10 MG capsule Take 1 capsule by mouth daily.  0  . Multiple Vitamin (MULTIVITAMIN) capsule Take 1 capsule by mouth daily. Gummies    . omeprazole (PRILOSEC OTC) 20 MG tablet Take 20 mg by mouth daily.     . ondansetron (ZOFRAN ODT) 4 MG disintegrating tablet Take 1 tablet (4 mg total) by mouth every 8 (eight) hours as needed for nausea or vomiting. (Patient not taking: Reported on 08/11/2018) 6 tablet 0  . tamsulosin (FLOMAX) 0.4 MG CAPS capsule Take 1 capsule (0.4 mg total) by mouth at bedtime. (Patient not taking: Reported  on 08/11/2018) 5 capsule 0    Drug Regimen Review Drug regimen was reviewed and remains appropriate with no significant issues identified  Home: Home Living Family/patient expects to be discharged to:: Private residence Living Arrangements: Spouse/significant other Available Help at Discharge: Family, Available 24 hours/day Type of Home: Mobile home Home Access: Stairs to  enter Entrance Stairs-Number of Steps: 3 Entrance Stairs-Rails: None Home Layout: One level Bathroom Shower/Tub: Tub/shower unit Bathroom Toilet: Standard Home Equipment: None  Lives With: Spouse   Functional History: Prior Function Level of Independence: Independent Comments: working and driving   Functional Status:  Mobility: Bed Mobility Overal bed mobility: Needs Assistance Bed Mobility: Supine to Sit, Sit to Supine Supine to sit: Supervision Sit to supine: Supervision General bed mobility comments: supervision for safety, increased time and effort Transfers Overall transfer level: Needs assistance Equipment used: 1 person hand held assist Transfers: Sit to/from Stand Sit to Stand: Min assist General transfer comment: min assist for balance/stability when transitioning to standing Ambulation/Gait Ambulation/Gait assistance: Mod assist Gait Distance (Feet): 22 Feet Assistive device: 1 person hand held assist Gait Pattern/deviations: Step-to pattern, Decreased stride length, Decreased weight shift to left, Shuffle, Staggering left General Gait Details: Patient cued for quad setting during loading response, noted extreme genu recurvatum during transition. Attempted HHA via LUE briefly but unable to maintain without significant loss of control.  Gait velocity: decreased Gait velocity interpretation: <1.31 ft/sec, indicative of household ambulator    ADL: ADL Overall ADL's : Needs assistance/impaired Grooming: Set up, Sitting Upper Body Bathing: Minimal assistance, Sitting Lower Body Bathing: Moderate assistance, Sit to/from stand Upper Body Dressing : Minimal assistance, Sitting Lower Body Dressing: Maximal assistance, Sit to/from stand Toilet Transfer: Minimal assistance, Stand-pivot(simulated in room) Toileting- Clothing Manipulation and Hygiene: Moderate assistance, Sit to/from stand Functional mobility during ADLs: Minimal assistance, Cueing for safety General ADL  Comments: Pt session focused on exercises to L UE, patient completed stand pivot to transition to HOB at completion of session.  Cognition: Cognition Overall Cognitive Status: Within Functional Limits for tasks assessed Arousal/Alertness: Awake/alert Orientation Level: Oriented X4 Attention: Focused, Sustained, Selective Focused Attention: Appears intact Sustained Attention: Appears intact Selective Attention: Impaired Selective Attention Impairment: Verbal basic Memory: Impaired Memory Impairment: Retrieval deficit, Decreased short term memory Decreased Short Term Memory: Verbal basic Cognition Arousal/Alertness: Awake/alert Behavior During Therapy: WFL for tasks assessed/performed Overall Cognitive Status: Within Functional Limits for tasks assessed  Physical Exam: Blood pressure (!) 150/94, pulse 96, temperature 98.6 F (37 C), temperature source Oral, resp. rate (!) 21, height 5' 7" (1.702 m), weight 92.5 kg, SpO2 97 %. Physical Exam  Vitals reviewed. Constitutional: He is oriented to person, place, and time.  HENT:  Head: Normocephalic and atraumatic.  Eyes: EOM are normal. Right eye exhibits no discharge. Left eye exhibits no discharge.  Neck: Normal range of motion. Neck supple. No thyromegaly present.  Cardiovascular: Normal rate, regular rhythm and normal heart sounds.  Respiratory: Effort normal and breath sounds normal. No respiratory distress.  GI: Bowel sounds are normal. He exhibits no distension.  Musculoskeletal:  No edema or tenderness in extremities  Neurological: He is alert and oriented to person, place, and time.  Follows commands.   Fair awareness of deficits Motor: RUE/RLE: 5/5 proximal to distal LUE/LLE: 4/5 proximal to distal   Skin: Skin is warm and dry.  Psychiatric: He has a normal mood and affect. His behavior is normal.    Results for orders placed or performed during the hospital encounter of 08/11/18 (  from the past 48 hour(s))  CBC      Status: None   Collection Time: 08/13/18  4:41 AM  Result Value Ref Range   WBC 6.3 4.0 - 10.5 K/uL   RBC 4.28 4.22 - 5.81 MIL/uL   Hemoglobin 13.2 13.0 - 17.0 g/dL   HCT 40.3 39.0 - 52.0 %   MCV 94.2 80.0 - 100.0 fL   MCH 30.8 26.0 - 34.0 pg   MCHC 32.8 30.0 - 36.0 g/dL   RDW 13.7 11.5 - 15.5 %   Platelets 214 150 - 400 K/uL   nRBC 0.0 0.0 - 0.2 %    Comment: Performed at [facility], [address] [place], [place] [address]  Basic metabolic panel     Status: Abnormal   Collection Time: 08/13/18  4:41 AM  Result Value Ref Range   Sodium 137 135 - 145 mmol/L   Potassium 3.8 3.5 - 5.1 mmol/L   Chloride 105 98 - 111 mmol/L   CO2 24 22 - 32 mmol/L   Glucose, Bld 112 (H) 70 - 99 mg/dL   BUN 25 (H) 6 - 20 mg/dL   Creatinine, Ser 1.07 0.61 - 1.24 mg/dL   Calcium 8.7 (L) 8.9 - 10.3 mg/dL   GFR calc non Af Amer >60 >60 mL/min   GFR calc Af Amer >60 >60 mL/min    Comment: (NOTE) The eGFR has been calculated using the CKD EPI equation. This calculation has not been validated in all clinical situations. eGFR's persistently <60 mL/min signify possible Chronic Kidney Disease.    Anion gap 8 5 - 15    Comment: Performed at [facility], [address] [place], [place] [address]   No results found.     Medical Problem List and Plan: 1.  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  Aspirin and Plavix x3 weeks then aspirin alone 2.  DVT Prophylaxis/Anticoagulation: Subcutaneous Lovenox.  Monitor for any bleeding episodes 3. Pain Management: Neurontin 300 mg every 8 hours as needed 4. Mood: Provide emotional support 5. Neuropsych: This patient is capable of making decisions on his own behalf. 6. Skin/Wound Care: Routine skin checks 7. Fluids/Electrolytes/Nutrition: Routine in and outs with follow-up chemistries 8.  Hypertension.  Norvasc 5 mg daily, Lotensin 10 mg daily.  Monitor with increased mobility 9.  Hyperlipidemia.  Lipitor 10.   Tobacco as well as polysubstance abuse.  Urine drug screen positive cocaine and marijuana.  Continue NicoDerm patch.  Provide counseling 11.  BPH.  Flomax 0.4 mg nightly.  Check PVR 12.  GERD.  Prilosec   Post Admission Physician Evaluation: 1. Preadmission assessment reviewed and changes made below. 2. Functional deficits secondary  to right pontine infarct. 3. Patient is admitted to receive collaborative, interdisciplinary care between the physiatrist, rehab nursing staff, and therapy team. 4. Patient's level of medical complexity and substantial therapy needs in context of that medical necessity cannot be provided at a lesser intensity of care such as a SNF. 5. Patient has experienced substantial functional loss from his/her baseline which was documented above under the "Functional History" and "Functional Status" headings.  Judging by the patient's diagnosis, physical exam, and functional history, the patient has potential for functional progress which will result in measurable gains while on inpatient rehab.  These gains will be of substantial and practical use upon discharge  in facilitating mobility and self-care at the household level. 6. Physiatrist will provide 24 hour management of medical needs   as well as oversight of the therapy plan/treatment and provide guidance as appropriate regarding the interaction of the two. 7. 24 hour rehab nursing will assist with safety, disease management and patient education  and help integrate therapy concepts, techniques,education, etc. 8. PT will assess and treat for/with: Lower extremity strength, range of motion, stamina, balance, functional mobility, safety, adaptive techniques and equipment, wound care, coping skills, pain control, education. Goals are: Supervision. 9. OT will assess and treat for/with: ADL's, functional mobility, safety, upper extremity strength, adaptive techniques and equipment, wound mgt, ego support, and community reintegration.    Goals are: Supervision. Therapy may proceed with showering this patient. 10. Case Management and Social Worker will assess and treat for psychological issues and discharge planning. 11. Team conference will be held weekly to assess progress toward goals and to determine barriers to discharge. 12. Patient will receive at least 3 hours of therapy per day at least 5 days per week. 13. ELOS: 8-12 days.       14. Prognosis:  good  I have personally performed a face to face diagnostic evaluation, including, but not limited to relevant history and physical exam findings, of this patient and developed relevant assessment and plan.  Additionally, I have reviewed and concur with the physician assistant's documentation above.  The patient's status has not changed. The original post admission physician evaluation remains appropriate, and any changes from the pre-admission screening or documentation from the acute chart are noted above.    Delice Lesch, MD, ABPMR Lavon Paganini Angiulli, PA-C 08/14/2018

## 2018-08-14 NOTE — Progress Notes (Signed)
Occupational Therapy Treatment Patient Details Name: [name] MRN: [id number] DOB: 1961/07/25 Today's Date: 08/14/2018    History of present illness 57 year old male admitted 08/11/18 with left sided weakness. TPA given. PMH: GERD, HTN, kidney stones, + tobacco use. 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   OT comments  Patient progressing well.  Eager for rehab admission.  Patient session focused on L UE neuro re-education through exercises and weight bearing.  Pt demonstrating increased wrist control and hand flexion/extension, fatigues easily and benefits from tactile input and AAROM for increased range.  Will continue to follow while admitted.    Follow Up Recommendations  CIR    Equipment Recommendations  Other (comment)(TBD at next venue of care)    Recommendations for Other Services Rehab consult    Precautions / Restrictions Precautions Precautions: Fall Restrictions Weight Bearing Restrictions: No       Mobility Bed Mobility Overal bed mobility: Needs Assistance Bed Mobility: Supine to Sit;Sit to Supine     Supine to sit: Supervision Sit to supine: Supervision   General bed mobility comments: supervision for safety, increased time and effort  Transfers Overall transfer level: Needs assistance Equipment used: 1 person hand held assist Transfers: Sit to/from Stand Sit to Stand: Min assist         General transfer comment: min assist for balance/stability when transitioning to standing    Balance Overall balance assessment: Needs assistance Sitting-balance support: No upper extremity supported;Feet supported Sitting balance-Leahy Scale: Fair     Standing balance support: Single extremity supported;During functional activity Standing balance-Leahy Scale: Poor Standing balance comment: reliance on external support                           ADL either performed or assessed with  clinical judgement   ADL Overall ADL's : Needs assistance/impaired                         Toilet Transfer: Minimal assistance;Stand-pivot(simulated in room)           Functional mobility during ADLs: Minimal assistance;Cueing for safety General ADL Comments: Pt session focused on exercises to L UE, patient completed stand pivot to transition to [facility] at completion of session.     Vision       Perception     Praxis      Cognition Arousal/Alertness: Awake/alert Behavior During Therapy: WFL for tasks assessed/performed Overall Cognitive Status: Within Functional Limits for tasks assessed                                          Exercises Exercises: Other exercises Other Exercises Other Exercises: L UE neuro reeducation exercises with AAROM for increased range, tactile input (tapping) and cueing for slow coordinated movements: retraction/protraction, shoulder flexion to 90, elbow flexion/extension, sup/pronation, wrist extension and hand flex/extension Other Exercises: weight bearing at EOB through L hand x 10   Shoulder Instructions       General Comments spouse present and supportive    Pertinent Vitals/ Pain       Pain Assessment: Faces Faces Pain Scale: Hurts a little bit Pain Location: head Pain Descriptors / Indicators: Headache Pain Intervention(s): Monitored during session;Patient requesting pain meds-RN notified  Home Living  Prior Functioning/Environment              Frequency  Min 3X/week        Progress Toward Goals  OT Goals(current goals can now be found in the care plan section)  Progress towards OT goals: Progressing toward goals  Acute Rehab OT Goals Patient Stated Goal: to get better OT Goal Formulation: With patient Time For Goal Achievement: 08/27/18 Potential to Achieve Goals: Good  Plan Discharge plan remains appropriate;Frequency remains  appropriate    Co-evaluation                 AM-PAC PT "6 Clicks" Daily Activity     Outcome Measure   Help from another person eating meals?: None Help from another person taking care of personal grooming?: None Help from another person toileting, which includes using toliet, bedpan, or urinal?: A Lot Help from another person bathing (including washing, rinsing, drying)?: A Lot Help from another person to put on and taking off regular upper body clothing?: A Little Help from another person to put on and taking off regular lower body clothing?: A Lot 6 Click Score: 17    End of Session Equipment Utilized During Treatment: Gait belt  OT Visit Diagnosis: Unsteadiness on feet (R26.81);Other abnormalities of gait and mobility (R26.89);Muscle weakness (generalized) (M62.81);Hemiplegia and hemiparesis Hemiplegia - Right/Left: Left Hemiplegia - dominant/non-dominant: Non-Dominant Hemiplegia - caused by: Cerebral infarction   Activity Tolerance Patient tolerated treatment well   Patient Left in bed;with call bell/phone within reach;with family/visitor present   Nurse Communication Mobility status;Patient requests pain meds        Time: 5784-6962 OT Time Calculation (min): 20 min  Charges: OT General Charges $OT Visit: 1 Visit OT Treatments $Neuromuscular Re-education: 8-22 mins  Chancy Milroy, OT Acute Rehabilitation Services Pager [phone] Office [phone]    Chancy Milroy 08/14/2018, 10:05 AM

## 2018-08-14 NOTE — PMR Pre-admission (Addendum)
PMR Admission Coordinator Pre-Admission Assessment  Patient: [name] is an 57 y.o., male MRN: [id number] DOB: [birth year] Height: 5\' 7"  (170.2 cm) Weight: 92.5 kg              Insurance Information  PRIMARY: uninsured       I advised wife to apply for disability and Medicaid with [facility] DSS  Medicaid Application Date:       Case Manager:  Disability Application Date:       Case Worker:   Emergency Conservator, museum/gallery Information    Name Relation Home Work Mobile   [place] Spouse [phone]  [phone]     Current Medical History  Patient Admitting Diagnosis: acute pontine infarction  History of Present Illness: [name] is a 57 year old right-handed male history of hypertension and tobacco abuse.  Presented 08/11/2018 with left-sided weakness of acute onset.  Cranial CT scan reviewed, unremarkable for acute intracranial process.  Patient did receive TPA.  Urine drug screen positive cocaine and marijuana.  CT angiogram of head and neck showed no large vessel occlusion high-grade stenosis or aneurysm.  MRI showed acute pontine infarction.  Echocardiogram with ejection fraction of 65% grade 1 diastolic dysfunction.  No defect or PFO identified.  Neurology consulted maintained on aspirin and Plavix x3 weeks then aspirin alone.  Subtends Lovenox for DVT prophylaxis.    Complete NIHSS TOTAL: 7    Past Medical History  Past Medical History:  Diagnosis Date  . Acid reflux   . Hypertension   . Kidney stone     Family History  family history includes Diabetes in his father; Heart disease in his father; Hyperlipidemia in his father; Hypertension in his father.  Prior Rehab/Hospitalizations:  Has the patient had major surgery during 100 days prior to admission? No  Current Medications   Current Facility-Administered Medications:  .  acetaminophen (TYLENOL) tablet 650 mg, 650 mg, Oral, Q4H PRN, 650 mg at 08/14/18 1030 **OR** [DISCONTINUED] acetaminophen  (TYLENOL) solution 650 mg, 650 mg, Per Tube, Q4H PRN **OR** [DISCONTINUED] acetaminophen (TYLENOL) suppository 650 mg, 650 mg, Rectal, Q4H PRN, Rejeana Brock, MD .  amLODipine (NORVASC) tablet 5 mg, 5 mg, Oral, Daily, Delia Heady S, MD, 5 mg at 08/14/18 1030 .  aspirin EC tablet 81 mg, 81 mg, Oral, Daily, Marvel Plan, MD, 81 mg at 08/14/18 1030 .  atorvastatin (LIPITOR) tablet 40 mg, 40 mg, Oral, q1800, Marvel Plan, MD, 40 mg at 08/13/18 1810 .  benazepril (LOTENSIN) tablet 10 mg, 10 mg, Oral, Daily, Micki Riley, MD, 10 mg at 08/14/18 1029 .  calcium carbonate (TUMS - dosed in mg elemental calcium) chewable tablet 200 mg of elemental calcium, 1 tablet, Oral, Once, Rejeana Brock, MD .  clopidogrel (PLAVIX) tablet 75 mg, 75 mg, Oral, Daily, Marvel Plan, MD, 75 mg at 08/14/18 1030 .  enoxaparin (LOVENOX) injection 40 mg, 40 mg, Subcutaneous, Q24H, Marvel Plan, MD, 40 mg at 08/14/18 1030 .  gabapentin (NEURONTIN) capsule 300 mg, 300 mg, Oral, Q8H PRN, Annie Main L, NP, 300 mg at 08/11/18 0944 .  labetalol (NORMODYNE,TRANDATE) injection 10-20 mg, 10-20 mg, Intravenous, Q2H PRN **AND** [DISCONTINUED] nicardipine (CARDENE) 20mg  in 0.86% saline IV infusion (0.1 mg/ml), 0-15 mg/hr, Intravenous, Continuous PRN, Rejeana Brock, MD .  Melatonin TABS 3 mg, 3 mg, Oral, QHS PRN, Marvel Plan, MD, 3 mg at 08/12/18 2227 .  nicotine (NICODERM CQ - dosed in mg/24 hours) patch 21 mg, 21 mg, Transdermal, Daily, Annie Main  L, NP, 21 mg at 08/14/18 1031 .  omeprazole (PRILOSEC) capsule 20 mg, 20 mg, Oral, BID AC, Biby, Sharon L, NP, 20 mg at 08/14/18 0830 .  ondansetron (ZOFRAN) injection 4 mg, 4 mg, Intravenous, Q4H PRN, Rejeana Brock, MD, 4 mg at 08/11/18 1453 .  tamsulosin (FLOMAX) capsule 0.4 mg, 0.4 mg, Oral, QHS, Biby, Sharon L, NP, 0.4 mg at 08/13/18 2039  Patients Current Diet:  Diet Order            Diet Heart Room service appropriate? Yes; Fluid consistency:  Thin  Diet effective now              Precautions / Restrictions Precautions Precautions: Fall Restrictions Weight Bearing Restrictions: No   Has the patient had 2 or more falls or a fall with injury in the past year?No  Prior Activity Level Community (5-7x/wk): very active; drives a dump truck  Journalist, newspaper / Corporate investment banker Devices/Equipment: None Home Equipment: None  Prior Device Use: Indicate devices/aids used by the patient prior to current illness, exacerbation or injury? None of the above  Prior Functional Level Prior Function Level of Independence: Independent Comments: working and driving   Self Care: Did the patient need help bathing, dressing, using the toilet or eating?  Independent  Indoor Mobility: Did the patient need assistance with walking from room to room (with or without device)? Independent  Stairs: Did the patient need assistance with internal or external stairs (with or without device)? Independent  Functional Cognition: Did the patient need help planning regular tasks such as shopping or remembering to take medications? Independent  Current Functional Level Cognition  Arousal/Alertness: Awake/alert Overall Cognitive Status: Within Functional Limits for tasks assessed Orientation Level: Oriented X4 Attention: Focused, Sustained, Selective Focused Attention: Appears intact Sustained Attention: Appears intact Selective Attention: Impaired Selective Attention Impairment: Verbal basic Memory: Impaired Memory Impairment: Retrieval deficit, Decreased short term memory Decreased Short Term Memory: Verbal basic    Extremity Assessment (includes Sensation/Coordination)  Upper Extremity Assessment: LUE deficits/detail LUE Deficits / Details: shoulder FF 3/5, elbow 3/5, forearm 3-/5, wrist 3-/5. hand 3-/5; able to use as gross stabilizer (noted improved grasp and wrist contro.) LUE Sensation: WNL LUE Coordination: decreased fine  motor, decreased gross motor  Lower Extremity Assessment: Defer to PT evaluation LLE Deficits / Details: assymetrical weakness noted in Left LE. Functional 3+/5  but fatigues easily and gives way LLE Coordination: decreased fine motor, decreased gross motor    ADLs  Overall ADL's : Needs assistance/impaired Grooming: Set up, Sitting Upper Body Bathing: Minimal assistance, Sitting Lower Body Bathing: Moderate assistance, Sit to/from stand Upper Body Dressing : Minimal assistance, Sitting Lower Body Dressing: Maximal assistance, Sit to/from stand Toilet Transfer: Minimal assistance, Stand-pivot(simulated in room) Toileting- Clothing Manipulation and Hygiene: Moderate assistance, Sit to/from stand Functional mobility during ADLs: Minimal assistance, Cueing for safety General ADL Comments: Pt session focused on exercises to L UE, patient completed stand pivot to transition to [facility] at completion of session.    Mobility  Overal bed mobility: Needs Assistance Bed Mobility: Supine to Sit, Sit to Supine Supine to sit: Supervision Sit to supine: Supervision General bed mobility comments: supervision for safety, increased time and effort    Transfers  Overall transfer level: Needs assistance Equipment used: 1 person hand held assist Transfers: Sit to/from Stand Sit to Stand: Min assist General transfer comment: min assist for balance/stability when transitioning to standing    Ambulation / Gait / Stairs / Psychologist, prison and probation services  Ambulation/Gait Ambulation/Gait assistance: Mod assist Gait Distance (Feet): 22 Feet Assistive device: 1 person hand held assist Gait Pattern/deviations: Step-to pattern, Decreased stride length, Decreased weight shift to left, Shuffle, Staggering left General Gait Details: Patient cued for quad setting during loading response, noted extreme genu recurvatum during transition. Attempted HHA via LUE briefly but unable to maintain without significant loss of control.   Gait velocity: decreased Gait velocity interpretation: <1.31 ft/sec, indicative of household ambulator    Posture / Balance Balance Overall balance assessment: Needs assistance Sitting-balance support: No upper extremity supported, Feet supported Sitting balance-Leahy Scale: Fair Standing balance support: Single extremity supported, During functional activity Standing balance-Leahy Scale: Poor Standing balance comment: reliance on external support    Special needs/care consideration BiPAP/CPAP n/a CPM n/a Continuous Drip IV n/a Dialysis n/a Life Vest n/a Oxygen n/a Special Bed n/a Trach Size n/a Wound Vac n/a Skin scratches to bilateral LE Bowel mgmt: no LBM documented Bladder mgmt: continent Diabetic mgmt Hgb A1c 5.6   Previous Home Environment Living Arrangements: Spouse/significant other  Lives With: Spouse Available Help at Discharge: Family, Available 24 hours/day Type of Home: Mobile home Home Layout: One level Home Access: Stairs to enter Entrance Stairs-Rails: None Entrance Stairs-Number of Steps: 3 Bathroom Shower/Tub: Associate Professor: Yes How Accessible: Accessible via walker Home Care Services: No  Discharge Living Setting Plans for Discharge Living Setting: Patient's home, Lives with (comment), Mobile Home(wife) Type of Home at Discharge: Mobile home Discharge Home Layout: One level Discharge Home Access: Stairs to enter Entrance Stairs-Rails: None Entrance Stairs-Number of Steps: 3 Discharge Bathroom Shower/Tub: Tub/shower unit, Curtain Discharge Bathroom Toilet: Standard Discharge Bathroom Accessibility: Yes How Accessible: Accessible via walker Does the patient have any problems obtaining your medications?: Yes (Describe)(uninsured)  Social/Family/Support Systems Patient Roles: Spouse, Parent(employee) Contact Information: wife, Dois [place] Anticipated Caregiver: wife Anticipated Industrial/product designer  Information: see above Ability/Limitations of Caregiver: no limitations Caregiver Availability: 24/7 Discharge Plan Discussed with Primary Caregiver: Yes Is Caregiver In Agreement with Plan?: Yes Does Caregiver/Family have Issues with Lodging/Transportation while Pt is in Rehab?: No  Goals/Additional Needs Patient/Family Goal for Rehab: supervision with PT and OT Expected length of stay: ELOS 8 to 12 days; pt wants shorter LOS due to cost Pt/Family Agrees to Admission and willing to participate: Yes Program Orientation Provided & Reviewed with Pt/Caregiver Including Roles  & Responsibilities: Yes  Barriers to Discharge: Insurance for SNF coverage  Decrease burden of Care through IP rehab admission: n/a  Possible need for SNF placement upon discharge:not anticipated  Patient Condition: This patient's condition remains as documented in the consult dated 08/14/2018, in which the Rehabilitation Physician determined and documented that the patient's condition is appropriate for intensive rehabilitative care in an inpatient rehabilitation facility. Will admit to inpatient rehab today.  Preadmission Screen Completed By:  Clois Dupes, 08/14/2018 3:10 PM ______________________________________________________________________   Discussed status with Dr. Allena Katz on 08/14/2018 at  1514 and received telephone approval for admission today.  Admission Coordinator:  Clois Dupes, time 1610 Date 08/14/2018

## 2018-08-14 NOTE — Progress Notes (Signed)
Barry Fennel, MD  Physician  Physical Medicine and Rehabilitation  Consult Note  Signed  Date of Service:  08/14/2018 5:59 AM       Related encounter: ED to Hosp-Admission (Current) from 08/11/2018 in [place] [facility]      Signed      Expand All Collapse All    Show:Clear all [x] Manual[x] Template[] Copied  Added by: [x] Angiulli, Mcarthur Rossetti, PA-C[x] Allena Katz Maryln Gottron, MD  [] Hover for details      Physical Medicine and Rehabilitation Consult Reason for Consult: Left-sided weakness Referring Physician: Dr.Xu   HPI: [name] is a 57 y.o. right-handed male with history of hypertension and tobacco abuse.  Per chart review, patient, and wife, patient lives with spouse.  Mobile home 3 steps to entry.  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  Wife can assist as needed.  Presented 08/11/2018 with left-sided weakness of acute onset.  Cranial CT scan reviewed, unremarkable for acute intracranial process Patient did receive TPA.  Urine drug screen positive cocaine and marijuana.  CT angiogram of head and neck showed no large vessel occlusion high-grade stenosis or aneurysm.  MRI with acute pontine infarction.  Echocardiogram with ejection fraction of 65% grade 1 diastolic dysfunction.  No defect or PFO identified.  Neurology consulted presently on aspirin and Plavix for CVA prophylaxis x3 weeks then aspirin alone.  Subcutaneous Lovenox for DVT prophylaxis.  Therapy evaluations completed with recommendations of physical medicine rehab consult.  Review of Systems  Constitutional: Negative for fever.  HENT: Negative for hearing loss.   Eyes: Negative for blurred vision and double vision.  Respiratory: Negative for cough and shortness of breath.   Cardiovascular: Negative for chest pain, palpitations and leg swelling.  Gastrointestinal: Positive for constipation. Negative for nausea and vomiting.       GERD  Genitourinary: Negative for dysuria,  flank pain and hematuria.  Musculoskeletal: Positive for myalgias.  Skin: Negative for rash.  Neurological: Positive for focal weakness.  All other systems reviewed and are negative.      Past Medical History:  Diagnosis Date  . Acid reflux   . Hypertension   . Kidney stone         Past Surgical History:  Procedure Laterality Date  . CHOLECYSTECTOMY          Family History  Problem Relation Age of Onset  . Hypertension Father   . Heart disease Father   . Hyperlipidemia Father   . Diabetes Father    Social History:  reports that he has been smoking cigarettes. He has been smoking about 2.00 packs per day. He has never used smokeless tobacco. He reports that he does not drink alcohol or use drugs. Allergies:       Allergies  Allergen Reactions  . Morphine And Related     Altered mental status  . Penicillins Hives    Has patient had a PCN reaction causing immediate rash, facial/tongue/throat swelling, SOB or lightheadedness with hypotension: Yes Has patient had a PCN reaction causing severe rash involving mucus membranes or skin necrosis: No Has patient had a PCN reaction that required hospitalization: No Has patient had a PCN reaction occurring within the last 10 years: No If all of the above answers are "NO", then may proceed with Cephalosporin use.          Medications Prior to Admission  Medication Sig Dispense Refill  . amLODipine-benazepril (LOTREL) 5-10 MG capsule Take 1 capsule by mouth daily.  0  .  Multiple Vitamin (MULTIVITAMIN) capsule Take 1 capsule by mouth daily. Gummies    . omeprazole (PRILOSEC OTC) 20 MG tablet Take 20 mg by mouth daily.     . ondansetron (ZOFRAN ODT) 4 MG disintegrating tablet Take 1 tablet (4 mg total) by mouth every 8 (eight) hours as needed for nausea or vomiting. (Patient not taking: Reported on 08/11/2018) 6 tablet 0  . tamsulosin (FLOMAX) 0.4 MG CAPS capsule Take 1 capsule (0.4 mg total) by mouth at  bedtime. (Patient not taking: Reported on 08/11/2018) 5 capsule 0    Home: Home Living Family/patient expects to be discharged to:: Private residence Living Arrangements: Spouse/significant other Available Help at Discharge: Family, Available 24 hours/day Type of Home: Mobile home Home Access: Stairs to enter Secretary/administrator of Steps: 3 Entrance Stairs-Rails: None Home Layout: One level Bathroom Shower/Tub: Engineer, manufacturing systems: Standard Home Equipment: None  Lives With: Spouse  Functional History: Prior Function Level of Independence: Independent Comments: working and driving  Functional Status:  Mobility: Bed Mobility Overal bed mobility: Needs Assistance Bed Mobility: Supine to Sit Supine to sit: Min assist General bed mobility comments: min assist for trunk support to come to EOB Transfers Overall transfer level: Needs assistance Equipment used: 1 person hand held assist Transfers: Sit to/from Stand Sit to Stand: Min assist General transfer comment: min assist for balance/stability when transitioning to standing Ambulation/Gait Ambulation/Gait assistance: Mod assist Gait Distance (Feet): 22 Feet Assistive device: 1 person hand held assist Gait Pattern/deviations: Step-to pattern, Decreased stride length, Decreased weight shift to left, Shuffle, Staggering left General Gait Details: Patient cued for quad setting during loading response, noted extreme genu recurvatum during transition. Attempted HHA via LUE briefly but unable to maintain without significant loss of control.  Gait velocity: decreased Gait velocity interpretation: <1.31 ft/sec, indicative of household ambulator  ADL: ADL Overall ADL's : Needs assistance/impaired Grooming: Set up, Sitting Upper Body Bathing: Minimal assistance, Sitting Lower Body Bathing: Moderate assistance, Sit to/from stand Upper Body Dressing : Minimal assistance, Sitting Lower Body Dressing: Maximal  assistance, Sit to/from stand Toilet Transfer: Minimal assistance, Ambulation(simulated to recliner) Toileting- Clothing Manipulation and Hygiene: Moderate assistance, Sit to/from stand Functional mobility during ADLs: Minimal assistance, Cueing for safety General ADL Comments: Pt educated on hemi dressing technqiues, safety and pacing.    Cognition: Cognition Overall Cognitive Status: Within Functional Limits for tasks assessed Arousal/Alertness: Awake/alert Orientation Level: Oriented X4 Attention: Focused, Sustained, Selective Focused Attention: Appears intact Sustained Attention: Appears intact Selective Attention: Impaired Selective Attention Impairment: Verbal basic Memory: Impaired Memory Impairment: Retrieval deficit, Decreased short term memory Decreased Short Term Memory: Verbal basic Cognition Arousal/Alertness: Awake/alert Behavior During Therapy: WFL for tasks assessed/performed Overall Cognitive Status: Within Functional Limits for tasks assessed  Blood pressure 110/68, pulse 84, temperature 98.4 F (36.9 C), temperature source Oral, resp. rate 16, height 5\' 7"  (1.702 m), weight 92.5 kg, SpO2 94 %. Physical Exam  Vitals reviewed. Constitutional: He is oriented to person, place, and time. He appears well-developed and well-nourished.  HENT:  Head: Normocephalic and atraumatic.  Eyes: EOM are normal. Right eye exhibits no discharge. Left eye exhibits no discharge.  Neck: Normal range of motion. Neck supple. No thyromegaly present.  Cardiovascular: Normal rate, regular rhythm and normal heart sounds.  Respiratory: Effort normal and breath sounds normal. No respiratory distress.  GI: Soft. Bowel sounds are normal. He exhibits no distension.  Musculoskeletal:  No edema or tenderness in extremities  Neurological: He is alert and oriented to person, place, and  time.  Follows full commands Motor: RUE/RLE: 5/5 proximal to distal LUE/LLE: 4/5 proximal to distal  Skin:  Skin is warm and dry.  Psychiatric: He has a normal mood and affect. His behavior is normal.    LabResultsLast24Hours  No results found for this or any previous visit (from the past 24 hour(s)).   ImagingResults(Last48hours)  No results found.    Assessment/Plan: Diagnosis: acute pontine infarction.   Labs and images (see above) independently reviewed.  Records reviewed and summated above. Stroke: Continue secondary stroke prophylaxis and Risk Factor Modification listed below:   Antiplatelet therapy:   Blood Pressure Management:  Continue current medication with prn's with permisive HTN per primary team Statin Agent:   Tobacco abuse:   Left sided hemiparesis Motor recovery: Fluoxetine  1. Does the need for close, 24 hr/day medical supervision in concert with the patient's rehab needs make it unreasonable for this patient to be served in a less intensive setting? Potentially  2. Co-Morbidities requiring supervision/potential complications: diastolic dysfunction (monitor for signs/symptoms of fluid overload), cocaine and marijuana abuse (counsel), HTN (monitor and provide prns in accordance with increased physical exertion and pain) 3. Due to bladder management, bowel management, safety, disease management and patient education, does the patient require 24 hr/day rehab nursing? Potentially 4. Does the patient require coordinated care of a physician, rehab nurse, PT (1-2 hrs/day, 5 days/week) and OT (1-2 hrs/day, 5 days/week) to address physical and functional deficits in the context of the above medical diagnosis(es)? Potentially Addressing deficits in the following areas: balance, endurance, locomotion, strength, transferring, bathing, dressing, toileting and psychosocial support 5. Can the patient actively participate in an intensive therapy program of at least 3 hrs of therapy per day at least 5 days per week? Yes 6. The potential for patient to make measurable gains while  on inpatient rehab is excellent 7. Anticipated functional outcomes upon discharge from inpatient rehab are supervision  with PT, supervision with OT, n/a with SLP. 8. Estimated rehab length of stay to reach the above functional goals is: 8-12 days. 9. Anticipated D/C setting: Home 10. Anticipated post D/C treatments: HH therapy and Home excercise program 11. Overall Rehab/Functional Prognosis: good  RECOMMENDATIONS: This patient's condition is appropriate for continued rehabilitative care in the following setting: CIR Patient has agreed to participate in recommended program. Yes Note that insurance prior authorization may be required for reimbursement for recommended care.  Comment: Rehab Admissions Coordinator to follow up.   I have personally performed a face to face diagnostic evaluation, including, but not limited to relevant history and physical exam findings, of this patient and developed relevant assessment and plan.  Additionally, I have reviewed and concur with the physician assistant's documentation above.   Maryla Morrow, MD, ABPMR Mcarthur Rossetti Angiulli, PA-C 08/14/2018        Revision History

## 2018-08-14 NOTE — Progress Notes (Signed)
Patient for dc to CIR today.

## 2018-08-14 NOTE — Progress Notes (Signed)
Inpatient Rehabilitation Admissions Coordinator  I met with pt, wife, daughter and son in law at bedside. We dicussed goals, expecations and cost of care as pt is uninsured. All in agreement to admit to inpt rehab today. I contacted Burnetta Sabin, [facility] for stroke service and will make the arrangements to admit today.  Danne Baxter, RN, MSN Rehab Admissions Coordinator [phone] 08/14/2018 3:05 PM

## 2018-08-14 NOTE — Consult Note (Signed)
Physical Medicine and Rehabilitation Consult Reason for Consult: Left-sided weakness Referring Physician: Dr.Xu   HPI: [name] is a 57 y.o. right-handed male with history of hypertension and tobacco abuse.  Per chart review, patient, and wife, patient lives with spouse.  Mobile home 3 steps to entry.  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  Wife can assist as needed.  Presented 08/11/2018 with left-sided weakness of acute onset.  Cranial CT scan reviewed, unremarkable for acute intracranial process Patient did receive TPA.  Urine drug screen positive cocaine and marijuana.  CT angiogram of head and neck showed no large vessel occlusion high-grade stenosis or aneurysm.  MRI with acute pontine infarction.  Echocardiogram with ejection fraction of 65% grade 1 diastolic dysfunction.  No defect or PFO identified.  Neurology consulted presently on aspirin and Plavix for CVA prophylaxis x3 weeks then aspirin alone.  Subcutaneous Lovenox for DVT prophylaxis.  Therapy evaluations completed with recommendations of physical medicine rehab consult.  Review of Systems  Constitutional: Negative for fever.  HENT: Negative for hearing loss.   Eyes: Negative for blurred vision and double vision.  Respiratory: Negative for cough and shortness of breath.   Cardiovascular: Negative for chest pain, palpitations and leg swelling.  Gastrointestinal: Positive for constipation. Negative for nausea and vomiting.       GERD  Genitourinary: Negative for dysuria, flank pain and hematuria.  Musculoskeletal: Positive for myalgias.  Skin: Negative for rash.  Neurological: Positive for focal weakness.  All other systems reviewed and are negative.  Past Medical History:  Diagnosis Date  . Acid reflux   . Hypertension   . Kidney stone    Past Surgical History:  Procedure Laterality Date  . CHOLECYSTECTOMY     Family History  Problem Relation Age of Onset  . Hypertension Father   .  Heart disease Father   . Hyperlipidemia Father   . Diabetes Father    Social History:  reports that he has been smoking cigarettes. He has been smoking about 2.00 packs per day. He has never used smokeless tobacco. He reports that he does not drink alcohol or use drugs. Allergies:  Allergies  Allergen Reactions  . Morphine And Related     Altered mental status  . Penicillins Hives    Has patient had a PCN reaction causing immediate rash, facial/tongue/throat swelling, SOB or lightheadedness with hypotension: Yes Has patient had a PCN reaction causing severe rash involving mucus membranes or skin necrosis: No Has patient had a PCN reaction that required hospitalization: No Has patient had a PCN reaction occurring within the last 10 years: No If all of the above answers are "NO", then may proceed with Cephalosporin use.    Medications Prior to Admission  Medication Sig Dispense Refill  . amLODipine-benazepril (LOTREL) 5-10 MG capsule Take 1 capsule by mouth daily.  0  . Multiple Vitamin (MULTIVITAMIN) capsule Take 1 capsule by mouth daily. Gummies    . omeprazole (PRILOSEC OTC) 20 MG tablet Take 20 mg by mouth daily.     . ondansetron (ZOFRAN ODT) 4 MG disintegrating tablet Take 1 tablet (4 mg total) by mouth every 8 (eight) hours as needed for nausea or vomiting. (Patient not taking: Reported on 08/11/2018) 6 tablet 0  . tamsulosin (FLOMAX) 0.4 MG CAPS capsule Take 1 capsule (0.4 mg total) by mouth at bedtime. (Patient not taking: Reported on 08/11/2018) 5 capsule 0    Home: Home Living Family/patient expects to be  discharged to:: Private residence Living Arrangements: Spouse/significant other Available Help at Discharge: Family, Available 24 hours/day Type of Home: Mobile home Home Access: Stairs to enter [employer] of Steps: 3 Entrance Stairs-Rails: None Home Layout: One level Bathroom Shower/Tub: Engineer, manufacturing systems: Standard Home Equipment: None   Lives With: Spouse  Functional History: Prior Function Level of Independence: Independent Comments: working and driving  Functional Status:  Mobility: Bed Mobility Overal bed mobility: Needs Assistance Bed Mobility: Supine to Sit Supine to sit: Min assist General bed mobility comments: min assist for trunk support to come to EOB Transfers Overall transfer level: Needs assistance Equipment used: 1 person hand held assist Transfers: Sit to/from Stand Sit to Stand: Min assist General transfer comment: min assist for balance/stability when transitioning to standing Ambulation/Gait Ambulation/Gait assistance: Mod assist Gait Distance (Feet): 22 Feet Assistive device: 1 person hand held assist Gait Pattern/deviations: Step-to pattern, Decreased stride length, Decreased weight shift to left, Shuffle, Staggering left General Gait Details: Patient cued for quad setting during loading response, noted extreme genu recurvatum during transition. Attempted HHA via LUE briefly but unable to maintain without significant loss of control.  Gait velocity: decreased Gait velocity interpretation: <1.31 ft/sec, indicative of household ambulator    ADL: ADL Overall ADL's : Needs assistance/impaired Grooming: Set up, Sitting Upper Body Bathing: Minimal assistance, Sitting Lower Body Bathing: Moderate assistance, Sit to/from stand Upper Body Dressing : Minimal assistance, Sitting Lower Body Dressing: Maximal assistance, Sit to/from stand Toilet Transfer: Minimal assistance, Ambulation(simulated to recliner) Toileting- Clothing Manipulation and Hygiene: Moderate assistance, Sit to/from stand Functional mobility during ADLs: Minimal assistance, Cueing for safety General ADL Comments: Pt educated on hemi dressing technqiues, safety and pacing.    Cognition: Cognition Overall Cognitive Status: Within Functional Limits for tasks assessed Arousal/Alertness: Awake/alert Orientation Level: Oriented  X4 Attention: Focused, Sustained, Selective Focused Attention: Appears intact Sustained Attention: Appears intact Selective Attention: Impaired Selective Attention Impairment: Verbal basic Memory: Impaired Memory Impairment: Retrieval deficit, Decreased short term memory Decreased Short Term Memory: Verbal basic Cognition Arousal/Alertness: Awake/alert Behavior During Therapy: WFL for tasks assessed/performed Overall Cognitive Status: Within Functional Limits for tasks assessed  Blood pressure 110/68, pulse 84, temperature 98.4 F (36.9 C), temperature source Oral, resp. rate 16, height 5\' 7"  (1.702 m), weight 92.5 kg, SpO2 94 %. Physical Exam  Vitals reviewed. Constitutional: He is oriented to person, place, and time. He appears well-developed and well-nourished.  HENT:  Head: Normocephalic and atraumatic.  Eyes: EOM are normal. Right eye exhibits no discharge. Left eye exhibits no discharge.  Neck: Normal range of motion. Neck supple. No thyromegaly present.  Cardiovascular: Normal rate, regular rhythm and normal heart sounds.  Respiratory: Effort normal and breath sounds normal. No respiratory distress.  GI: Soft. Bowel sounds are normal. He exhibits no distension.  Musculoskeletal:  No edema or tenderness in extremities  Neurological: He is alert and oriented to person, place, and time.  Follows full commands Motor: RUE/RLE: 5/5 proximal to distal LUE/LLE: 4/5 proximal to distal  Skin: Skin is warm and dry.  Psychiatric: He has a normal mood and affect. His behavior is normal.    No results found for this or any previous visit (from the past 24 hour(s)). No results found.  Assessment/Plan: Diagnosis: acute pontine infarction.   Labs and images (see above) independently reviewed.  Records reviewed and summated above. Stroke: Continue secondary stroke prophylaxis and Risk Factor Modification listed below:   Antiplatelet therapy:   Blood Pressure Management:  Continue  current  medication with prn's with permisive HTN per primary team Statin Agent:   Tobacco abuse:   Left sided hemiparesis Motor recovery: Fluoxetine  1. Does the need for close, 24 hr/day medical supervision in concert with the patient's rehab needs make it unreasonable for this patient to be served in a less intensive setting? Potentially  2. Co-Morbidities requiring supervision/potential complications: diastolic dysfunction (monitor for signs/symptoms of fluid overload), cocaine and marijuana abuse (counsel), HTN (monitor and provide prns in accordance with increased physical exertion and pain) 3. Due to bladder management, bowel management, safety, disease management and patient education, does the patient require 24 hr/day rehab nursing? Potentially 4. Does the patient require coordinated care of a physician, rehab nurse, PT (1-2 hrs/day, 5 days/week) and OT (1-2 hrs/day, 5 days/week) to address physical and functional deficits in the context of the above medical diagnosis(es)? Potentially Addressing deficits in the following areas: balance, endurance, locomotion, strength, transferring, bathing, dressing, toileting and psychosocial support 5. Can the patient actively participate in an intensive therapy program of at least 3 hrs of therapy per day at least 5 days per week? Yes 6. The potential for patient to make measurable gains while on inpatient rehab is excellent 7. Anticipated functional outcomes upon discharge from inpatient rehab are supervision  with PT, supervision with OT, n/a with SLP. 8. Estimated rehab length of stay to reach the above functional goals is: 8-12 days. 9. Anticipated D/C setting: Home 10. Anticipated post D/C treatments: HH therapy and Home excercise program 11. Overall Rehab/Functional Prognosis: good  RECOMMENDATIONS: This patient's condition is appropriate for continued rehabilitative care in the following setting: CIR Patient has agreed to participate in  recommended program. Yes Note that insurance prior authorization may be required for reimbursement for recommended care.  Comment: Rehab Admissions Coordinator to follow up.   I have personally performed a face to face diagnostic evaluation, including, but not limited to relevant history and physical exam findings, of this patient and developed relevant assessment and plan.  Additionally, I have reviewed and concur with the physician assistant's documentation above.   Maryla Morrow, MD, ABPMR Mcarthur Rossetti Angiulli, PA-C 08/14/2018

## 2018-08-14 NOTE — Progress Notes (Signed)
STROKE TEAM PROGRESS NOTE   INTERVAL HISTORY Family is present at bedside. Pt medically ready for d/c to CIR, MD saw him this am.  Vitals:   08/13/18 1954 08/13/18 2353 08/14/18 0353 08/14/18 0754  BP: (!) 158/84 117/69 110/68 (!) 149/100  Pulse: 89 79 84 (!) 103  Resp: (!) 21 17 16 20   Temp: 98.3 F (36.8 C) 98.6 F (37 C) 98.4 F (36.9 C) 97.9 F (36.6 C)  TempSrc: Oral Oral Oral Oral  SpO2: 97% 93% 94% 96%  Weight:      Height:        CBC:  Recent Labs  Lab 08/11/18 0024  08/12/18 0607 08/13/18 0441  WBC 10.9*   < > 6.5 6.3  NEUTROABS 8.8*  --   --   --   HGB 13.6   < > 12.9* 13.2  HCT 41.4   < > 40.8 40.3  MCV 93.9   < > 94.4 94.2  PLT 220   < > 225 214   < > = values in this interval not displayed.    Basic Metabolic Panel:  Recent Labs  Lab 08/12/18 0607 08/13/18 0441  NA 137 137  K 3.9 3.8  CL 105 105  CO2 26 24  GLUCOSE 100* 112*  BUN 16 25*  CREATININE 1.07 1.07  CALCIUM 8.9 8.7*   Lipid Panel:     Component Value Date/Time   CHOL 225 (H) 08/11/2018 0831   CHOL 244 (H) 02/15/2014 1630   TRIG 200 (H) 08/11/2018 0831   HDL 33 (L) 08/11/2018 0831   HDL 30 (L) 02/15/2014 1630   CHOLHDL 6.8 08/11/2018 0831   VLDL 40 08/11/2018 0831   LDLCALC 152 (H) 08/11/2018 0831   LDLCALC 168 (H) 02/15/2014 1630   HgbA1c:  Lab Results  Component Value Date   HGBA1C 5.6 08/11/2018   Urine Drug Screen:     Component Value Date/Time   LABOPIA POSITIVE (A) 08/12/2018 0201   COCAINSCRNUR POSITIVE (A) 08/12/2018 0201   LABBENZ NONE DETECTED 08/12/2018 0201   AMPHETMU NONE DETECTED 08/12/2018 0201   THCU POSITIVE (A) 08/12/2018 0201   LABBARB NONE DETECTED 08/12/2018 0201    Alcohol Level     Component Value Date/Time   ETH <10 08/11/2018 0024    IMAGING  Ct Angio Head W Or Wo Contrast  Result Date: 08/11/2018 CLINICAL DATA:  56 y/o  M; acute onset left-sided weakness. EXAM: CT ANGIOGRAPHY HEAD AND NECK CT PERFUSION BRAIN TECHNIQUE:  Multidetector CT imaging of the head and neck was performed using the standard protocol during bolus administration of intravenous contrast. Multiplanar CT image reconstructions and MIPs were obtained to evaluate the vascular anatomy. Carotid stenosis measurements (when applicable) are obtained utilizing NASCET criteria, using the distal internal carotid diameter as the denominator. Multiphase CT imaging of the brain was performed following IV bolus contrast injection. Subsequent parametric perfusion maps were calculated using RAPID software. CONTRAST:  ISOVUE-370 IOPAMIDOL (ISOVUE-370) INJECTION 76% COMPARISON:  08/11/2018 CT head. FINDINGS: CTA NECK FINDINGS Aortic arch: Bovine variant branching. Imaged portion shows no evidence of aneurysm or dissection. No significant stenosis of the major arch vessel origins. Right carotid system: No evidence of dissection, stenosis (50% or greater) or occlusion. Non stenotic calcific atherosclerosis of carotid bifurcation. Left carotid system: No evidence of dissection, stenosis (50% or greater) or occlusion. Non stenotic calcific atherosclerosis of carotid bifurcation. Vertebral arteries: Left dominant vertebral artery. Calcific atherosclerosis of left vertebral artery origin with severe stenosis. Beaded  appearance of the left vertebral artery in the neck multiple segments of mild-to-moderate stenosis. Patent normal appearance of the right vertebral artery. Skeleton: Negative. Other neck: Negative. Upper chest: 3 mm right middle fissure perifissural nodule, likely intrapulmonary lymph node. Review of the MIP images confirms the above findings CTA HEAD FINDINGS Anterior circulation: No significant stenosis, proximal occlusion, aneurysm, or vascular malformation. Mixed plaque of carotid siphons with mild stenosis. Posterior circulation: No significant stenosis, proximal occlusion, aneurysm, or vascular malformation. Mild mid basilar stenosis. Venous sinuses: As permitted  by contrast timing, patent. Anatomic variants: Large right A1, large anterior communicating artery, diminutive left A1, normal variant. Fetal left PCA. Small patent right posterior communicating artery. Review of the MIP images confirms the above findings CT Brain Perfusion Findings: CBF (<30%) Volume: 0mL Perfusion (Tmax>6.0s) volume: 0mL Mismatch Volume: 0mL Infarction Location:Negative. IMPRESSION: CTA neck: 1. Patent bilateral carotid systems and right vertebral artery without hemodynamically significant stenosis by NASCET criteria, dissection, or aneurysm. 2. 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 CTA head: 1. No large vessel occlusion, high-grade stenosis, or aneurysm. 2. Mild atherosclerosis of carotid siphons and the basilar artery without significant stenosis. CT brain perfusion: Normal CT brain perfusion. These results were called by telephone at the time of interpretation on 08/11/2018 at 1:36 am to Dr. Preston Fleeting, who verbally acknowledged these results. Electronically Signed   By: Mitzi Hansen M.D.   On: 08/11/2018 01:40   Ct Angio Neck W Or Wo Contrast  Result Date: 08/11/2018 CLINICAL DATA:  57 y/o  M; acute onset left-sided weakness. EXAM: CT ANGIOGRAPHY HEAD AND NECK CT PERFUSION BRAIN TECHNIQUE: Multidetector CT imaging of the head and neck was performed using the standard protocol during bolus administration of intravenous contrast. Multiplanar CT image reconstructions and MIPs were obtained to evaluate the vascular anatomy. Carotid stenosis measurements (when applicable) are obtained utilizing NASCET criteria, using the distal internal carotid diameter as the denominator. Multiphase CT imaging of the brain was performed following IV bolus contrast injection. Subsequent parametric perfusion maps were calculated  using RAPID software. CONTRAST:  ISOVUE-370 IOPAMIDOL (ISOVUE-370) INJECTION 76% COMPARISON:  08/11/2018 CT head. FINDINGS: CTA NECK FINDINGS Aortic arch: Bovine variant branching. Imaged portion shows no evidence of aneurysm or dissection. No significant stenosis of the major arch vessel origins. Right carotid system: No evidence of dissection, stenosis (50% or greater) or occlusion. Non stenotic calcific atherosclerosis of carotid bifurcation. Left carotid system: No evidence of dissection, stenosis (50% or greater) or occlusion. Non stenotic calcific atherosclerosis of carotid bifurcation. Vertebral arteries: Left dominant vertebral artery. Calcific atherosclerosis of left vertebral artery origin with severe stenosis. Beaded appearance of the left vertebral artery in the neck multiple segments of mild-to-moderate stenosis. Patent normal appearance of the right vertebral artery. Skeleton: Negative. Other neck: Negative. Upper chest: 3 mm right middle fissure perifissural nodule, likely intrapulmonary lymph node. Review of the MIP images confirms the above findings CTA HEAD FINDINGS Anterior circulation: No significant stenosis, proximal occlusion, aneurysm, or vascular malformation. Mixed plaque of carotid siphons with mild stenosis. Posterior circulation: No significant stenosis, proximal occlusion, aneurysm, or vascular malformation. Mild mid basilar stenosis. Venous sinuses: As permitted by contrast timing, patent. Anatomic variants: Large right A1, large anterior communicating artery, diminutive left A1, normal variant. Fetal left PCA. Small patent right posterior communicating artery. Review of the MIP images confirms the above findings CT Brain Perfusion Findings: CBF (<  30%) Volume: 0mL Perfusion (Tmax>6.0s) volume: 0mL Mismatch Volume: 0mL Infarction Location:Negative. IMPRESSION: CTA neck: 1. Patent bilateral carotid systems and right vertebral artery without hemodynamically significant stenosis by  NASCET criteria, dissection, or aneurysm. 2. 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 CTA head: 1. No large vessel occlusion, high-grade stenosis, or aneurysm. 2. Mild atherosclerosis of carotid siphons and the basilar artery without significant stenosis. CT brain perfusion: Normal CT brain perfusion. These results were called by telephone at the time of interpretation on 08/11/2018 at 1:36 am to Dr. Preston Fleeting, who verbally acknowledged these results. Electronically Signed   By: Mitzi Hansen M.D.   On: 08/11/2018 01:40   Mr Brain Wo Contrast  Result Date: 08/11/2018 CLINICAL DATA:  Left-sided weakness. EXAM: MRI HEAD WITHOUT CONTRAST TECHNIQUE: Multiplanar, multiecho pulse sequences of the brain and surrounding structures were obtained without intravenous contrast. COMPARISON:  Head CT, CTA, and cerebral perfusion 08/11/2018 FINDINGS: Brain: An acute to early subacute right paramedian pontine infarct measures 12 mm. No intracranial hemorrhage, mass, midline shift, or extra-axial fluid collection is identified. The ventricles and sulci are normal. Scattered small foci of T2 hyperintensity in the cerebral white matter and pons separate from the infarct are nonspecific but compatible with mild chronic small vessel ischemic disease. Vascular: Major intracranial vascular flow voids are preserved. Skull and upper cervical spine: Unremarkable bone marrow signal. Sinuses/Orbits: Unremarkable orbits. Mild bilateral ethmoid air cell mucosal thickening. Trace mastoid effusions. Other: None. IMPRESSION: 1. Acute pontine infarct. 2. Mild chronic small vessel ischemic disease. Electronically Signed   By: Sebastian Ache M.D.   On: 08/11/2018 17:24   Ct Cerebral Perfusion W Contrast  Result Date: 08/11/2018 CLINICAL DATA:  57 y/o  M; acute onset  left-sided weakness. EXAM: CT ANGIOGRAPHY HEAD AND NECK CT PERFUSION BRAIN TECHNIQUE: Multidetector CT imaging of the head and neck was performed using the standard protocol during bolus administration of intravenous contrast. Multiplanar CT image reconstructions and MIPs were obtained to evaluate the vascular anatomy. Carotid stenosis measurements (when applicable) are obtained utilizing NASCET criteria, using the distal internal carotid diameter as the denominator. Multiphase CT imaging of the brain was performed following IV bolus contrast injection. Subsequent parametric perfusion maps were calculated using RAPID software. CONTRAST:  ISOVUE-370 IOPAMIDOL (ISOVUE-370) INJECTION 76% COMPARISON:  08/11/2018 CT head. FINDINGS: CTA NECK FINDINGS Aortic arch: Bovine variant branching. Imaged portion shows no evidence of aneurysm or dissection. No significant stenosis of the major arch vessel origins. Right carotid system: No evidence of dissection, stenosis (50% or greater) or occlusion. Non stenotic calcific atherosclerosis of carotid bifurcation. Left carotid system: No evidence of dissection, stenosis (50% or greater) or occlusion. Non stenotic calcific atherosclerosis of carotid bifurcation. Vertebral arteries: Left dominant vertebral artery. Calcific atherosclerosis of left vertebral artery origin with severe stenosis. Beaded appearance of the left vertebral artery in the neck multiple segments of mild-to-moderate stenosis. Patent normal appearance of the right vertebral artery. Skeleton: Negative. Other neck: Negative. Upper chest: 3 mm right middle fissure perifissural nodule, likely intrapulmonary lymph node. Review of the MIP images confirms the above findings CTA HEAD FINDINGS Anterior circulation: No significant stenosis, proximal occlusion, aneurysm, or vascular malformation. Mixed plaque of carotid siphons with mild stenosis. Posterior circulation: No significant stenosis, proximal occlusion,  aneurysm, or vascular malformation. Mild mid basilar stenosis. Venous sinuses: As permitted by contrast timing, patent. Anatomic variants: Large right A1, large  anterior communicating artery, diminutive left A1, normal variant. Fetal left PCA. Small patent right posterior communicating artery. Review of the MIP images confirms the above findings CT Brain Perfusion Findings: CBF (<30%) Volume: 0mL Perfusion (Tmax>6.0s) volume: 0mL Mismatch Volume: 0mL Infarction Location:Negative. IMPRESSION: CTA neck: 1. Patent bilateral carotid systems and right vertebral artery without hemodynamically significant stenosis by NASCET criteria, dissection, or aneurysm. 2. 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 CTA head: 1. No large vessel occlusion, high-grade stenosis, or aneurysm. 2. Mild atherosclerosis of carotid siphons and the basilar artery without significant stenosis. CT brain perfusion: Normal CT brain perfusion. These results were called by telephone at the time of interpretation on 08/11/2018 at 1:36 am to Dr. Preston Fleeting, who verbally acknowledged these results. Electronically Signed   By: Mitzi Hansen M.D.   On: 08/11/2018 01:40   Ct Head Code Stroke Wo Contrast  Result Date: 08/11/2018 CLINICAL DATA:  Code stroke.  Sudden onset left-sided weakness. EXAM: CT HEAD WITHOUT CONTRAST TECHNIQUE: Contiguous axial images were obtained from the base of the skull through the vertex without intravenous contrast. COMPARISON:  06/24/2018 CT head FINDINGS: Brain: No evidence of acute infarction, hemorrhage, hydrocephalus, extra-axial collection or mass lesion/mass effect. Vascular: Asymmetric mildly increased density within the right terminal ICA may represent thrombus (series 4, image 27). Skull: Normal. Negative for fracture or focal lesion.  Sinuses/Orbits: No acute finding. Other: None. ASPECTS [facility] Stroke Program Early CT Score) - Ganglionic level infarction (caudate, lentiform nuclei, internal capsule, insula, M1-M3 cortex): 7 - Supraganglionic infarction (M4-M6 cortex): 3 Total score (0-10 with 10 being normal): 10 IMPRESSION: 1. No acute abnormality of the brain identified. 2. Asymmetric mildly increased density in right terminal ICA may reflect thrombus. 3. ASPECTS is 10 These results were called by telephone at the time of interpretation on 08/11/2018 at 12:34 am to Dr. Dione Booze , who verbally acknowledged these results. Electronically Signed   By: Mitzi Hansen M.D.   On: 08/11/2018 00:38    PHYSICAL EXAM General - Well nourished, well developed, in no apparent distress.  Ophthalmologic - fundi not visualized due to noncooperation.  Cardiovascular - Regular rate and rhythm.  Mental Status -  Level of arousal and orientation to time, place, and person were intact. Language including expression, naming, repetition, comprehension was assessed and found intact. Fund of Knowledge was assessed and was intact.  Cranial Nerves II - XII - II - Visual field intact OU. III, IV, VI - Extraocular movements intact. V - Facial sensation intact bilaterally. VII - face symmetric VIII - Hearing & vestibular intact bilaterally. X - Palate elevates symmetrically. XI - Chin turning & shoulder shrug intact bilaterally. XII - Tongue protrusion intact.  Motor Strength - The patient's strength was normal in RUE and RLE, however, 4/5 LUE proximal, 2+/5 wrist extension and finger movement, 4+/5 LLE proximal, 4/5 distally. Bulk was normal and fasciculations were absent.   Motor Tone - Muscle tone was assessed at the neck and appendages and was normal.  Sensory - Light touch, temperature/pinprick were assessed and were symmetrical.    Coordination - The patient had normal movements in the right hand with no ataxia or  dysmetria.  Tremor was absent.  Gait and Station - deferred.   ASSESSMENT/PLAN [name] is a 57 y.o. male with history of HTN and tobacco abuse presenting with L hemiparesis. Received IV tPA  08/11/2017 at 0056.  Stroke:  right pontine infarct s/p tPA, likely due to small vessel disease given risk factors of hypertension, smoking, cocaine and THC use  Code Stroke CT head No acute stroke. Asymmetry R terminal ICA ? Thrombus. ASPECTS 10.     CTA head no LVO. Mild atherosclerosis   CTA neck extensive atherosclerosis L VA, severe stenosis/near occlusion  CT perfusion normal  MRI right pontine infarct  2D Echo EF 60 to 65%  LDL 152  HgbA1c 5.6  HIV/RPR negative  UDS positive for cocaine, THC and opiates  Lovenox for VTE prophylaxis  No antithrombotic prior to admission, now on aspirin 81 and Plavix 75.  Recommend DAPT for 3 weeks and then aspirin alone  Therapy recommendations: CIR. Medically ready for d/c  Disposition:  pending   Hypertensive Emergency  Stable   Home meds:  Amlodipine-benezepril 5-10 daily  Stable, off Cardene . Resumed home meds . Long-term BP goal normotensive  Hyperlipidemia  Home meds none  LDL 152, goal less than 70  On Lipitor 40  Continue statin on discharge  Cocaine abuse  UDS positive for cocaine  Pt denies cocaine use  Cessation education will be provided when wife leaves the room  Tobacco abuse  Current smoker  Smoking cessation counseling provided  Nicotine patch provided  Pt is willing to quit  Other Stroke Risk Factors  Obesity, Body mass index is 31.94 kg/m., recommend weight loss, diet and exercise as appropriate   THC use, UDS positive for THC  Cocaine use, UDS positive.  Opiate use, UDS positive.  Other Active Problems  Reflux on prilosec  BPH on flomax  L leg cramps. Add gabapentin prn  Hospital day # 3  Annie Main, MSN, APRN, ANVP-BC, AGPCNP-BC Advanced Practice Stroke  [facility] See Amion for Schedule & Pager information 08/14/2018 2:10 PM   To contact Stroke Continuity provider, please refer to [email or web address]. After hours, contact General Neurology

## 2018-08-15 ENCOUNTER — Inpatient Hospital Stay (HOSPITAL_COMMUNITY): Payer: Self-pay

## 2018-08-15 ENCOUNTER — Inpatient Hospital Stay (HOSPITAL_COMMUNITY): Payer: Self-pay | Admitting: Occupational Therapy

## 2018-08-15 ENCOUNTER — Inpatient Hospital Stay (HOSPITAL_COMMUNITY): Payer: Self-pay | Admitting: Physical Therapy

## 2018-08-15 DIAGNOSIS — N400 Enlarged prostate without lower urinary tract symptoms: Secondary | ICD-10-CM

## 2018-08-15 DIAGNOSIS — I6350 Cerebral infarction due to unspecified occlusion or stenosis of unspecified cerebral artery: Secondary | ICD-10-CM

## 2018-08-15 DIAGNOSIS — I10 Essential (primary) hypertension: Secondary | ICD-10-CM

## 2018-08-15 DIAGNOSIS — N182 Chronic kidney disease, stage 2 (mild): Secondary | ICD-10-CM

## 2018-08-15 LAB — COMPREHENSIVE METABOLIC PANEL
ALT: 29 U/L (ref 0–44)
AST: 24 U/L (ref 15–41)
Albumin: 3.5 g/dL (ref 3.5–5.0)
Alkaline Phosphatase: 57 U/L (ref 38–126)
Anion gap: 8 (ref 5–15)
BUN: 26 mg/dL — ABNORMAL HIGH (ref 6–20)
CO2: 20 mmol/L — ABNORMAL LOW (ref 22–32)
Calcium: 8.8 mg/dL — ABNORMAL LOW (ref 8.9–10.3)
Chloride: 108 mmol/L (ref 98–111)
Creatinine, Ser: 1.05 mg/dL (ref 0.61–1.24)
GFR calc Af Amer: >60 mL/min (ref >60)
GFR calc non Af Amer: >60 mL/min (ref >60)
Glucose, Bld: 108 mg/dL — ABNORMAL HIGH (ref 70–99)
Potassium: 3.8 mmol/L (ref 3.5–5.1)
Sodium: 136 mmol/L (ref 135–145)
Total Bilirubin: 0.4 mg/dL (ref 0.3–1.2)
Total Protein: 6.4 g/dL — ABNORMAL LOW (ref 6.5–8.1)

## 2018-08-15 LAB — CBC WITH DIFFERENTIAL/PLATELET
Abs Immature Granulocytes: 0.02 10*3/uL (ref 0.00–0.07)
Basophils Absolute: 0 10*3/uL (ref 0.0–0.1)
Basophils Relative: 1 %
Eosinophils Absolute: 0.3 10*3/uL (ref 0.0–0.5)
Eosinophils Relative: 4 %
HCT: 40.1 % (ref 39.0–52.0)
Hemoglobin: 13.4 g/dL (ref 13.0–17.0)
Immature Granulocytes: 0 %
Lymphocytes Relative: 26 %
Lymphs Abs: 1.7 10*3/uL (ref 0.7–4.0)
MCH: 31.3 pg (ref 26.0–34.0)
MCHC: 33.4 g/dL (ref 30.0–36.0)
MCV: 93.7 fL (ref 80.0–100.0)
Monocytes Absolute: 0.4 10*3/uL (ref 0.1–1.0)
Monocytes Relative: 6 %
Neutro Abs: 4.2 10*3/uL (ref 1.7–7.7)
Neutrophils Relative %: 63 %
Platelets: 211 10*3/uL (ref 150–400)
RBC: 4.28 MIL/uL (ref 4.22–5.81)
RDW: 13.5 % (ref 11.5–15.5)
WBC: 6.6 10*3/uL (ref 4.0–10.5)
nRBC: 0 % (ref 0.0–0.2)

## 2018-08-15 NOTE — Progress Notes (Addendum)
Inpatient Rehabilitation  Patient information reviewed and entered into eRehab system by Johnhenry Tippin M. Sura Canul, M.A., CCC/SLP, PPS Coordinator.  Information including medical coding, functional ability and quality indicators will be reviewed and updated through discharge.    

## 2018-08-15 NOTE — Plan of Care (Signed)
  Problem: RH SAFETY Goal: RH STG ADHERE TO SAFETY PRECAUTIONS W/ASSISTANCE/DEVICE Description STG Adhere to Safety Precautions With Assistance/Device. Mod I  Outcome: Progressing  Call light within reach, bed/chair alarm, proper footwear  

## 2018-08-15 NOTE — Evaluation (Signed)
Physical Therapy Assessment and Plan  Patient Details  Name: [name] MRN: [id number] Date of Birth: Jun 26, 1961  PT Diagnosis: Abnormal posture, Difficulty walking, Hemiparesis non-dominant and Muscle weakness Rehab Potential: Good ELOS: 7-10 days   Today's Date: 08/15/2018 PT Individual Time: 0923-3007 PT Individual Time Calculation (min): 68 min    Problem List:  Patient Active Problem List   Diagnosis Date Noted  . CKD (chronic kidney disease), stage II   . Benign prostatic hyperplasia   . Hyperlipidemia 08/14/2018  . Tobacco use disorder 08/14/2018  . Obesity 08/14/2018  . GERD (gastroesophageal reflux disease) 08/14/2018  . Leg cramp, left 08/14/2018  . Elevated blood pressure reading with diagnosis of hypertension   . Diastolic dysfunction   . Polysubstance abuse ([place])   . Right pontine cerebrovascular accident [facility]) s/p tPA 08/11/2018  . Hypertension 02/17/2014    Past Medical History:  Past Medical History:  Diagnosis Date  . Acid reflux   . Hypertension   . Kidney stone    Past Surgical History:  Past Surgical History:  Procedure Laterality Date  . CHOLECYSTECTOMY      Assessment & Plan Clinical Impression: Patient is a 57 y.o. year old male with history of hypertension and tobacco abuse.  Per chart review, patient, and wife, patient lives with spouse.  Mobile home 3 steps to entry.  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  Wife can assist as needed.  Presented 08/11/2018 with left-sided weakness of acute onset.  Cranial CT scan reviewed, unremarkable for acute intracranial process.  Patient did receive TPA.  Urine drug screen positive cocaine and marijuana.  CT angiogram of head and neck showed no large vessel occlusion high-grade stenosis or aneurysm.  MRI showed acute pontine infarction.  Echocardiogram with ejection fraction of 62% grade 1 diastolic dysfunction.  No defect or PFO identified.  Neurology consulted maintained on aspirin and  Plavix x3 weeks then aspirin alone.  Subtends Lovenox for DVT prophylaxis.  Therapy evaluations completed with recommendations of physical medicine rehab consult.  Patient was admitted for a comprehensive rehab program. Patient transferred to CIR on 08/14/2018 .   Patient currently requires min with mobility secondary to muscle weakness, impaired timing and sequencing and decreased coordination and decreased standing balance, decreased postural control, hemiplegia and decreased balance strategies.  Prior to hospitalization, patient was independent  with mobility and lived with Spouse in a Mobile home home.  Home access is 3Stairs to enter.  Patient will benefit from skilled PT intervention to maximize safe functional mobility, minimize fall risk and decrease caregiver burden for planned discharge home with supervision from wife as needed.  Anticipate patient will benefit from follow up OP at discharge.  PT - End of Session Activity Tolerance: Tolerates 30+ min activity with multiple rests Endurance Deficit: Yes PT Assessment Rehab Potential (ACUTE/IP ONLY): Good PT Patient demonstrates impairments in the following area(s): Balance;Behavior;Edema;Endurance;Motor;Pain;Safety PT Transfers Functional Problem(s): Bed Mobility;Bed to Chair;Car;Furniture;Floor PT Locomotion Functional Problem(s): Ambulation;Wheelchair Mobility;Stairs PT Plan PT Intensity: Minimum of 1-2 x/day ,45 to 90 minutes PT Frequency: 5 out of 7 days PT Duration Estimated Length of Stay: 7-10 days PT Treatment/Interventions: Ambulation/gait training;Stair training;Balance/vestibular training;DME/adaptive equipment instruction;Patient/family education;Therapeutic Activities;Wheelchair propulsion/positioning;Cognitive remediation/compensation;Psychosocial support;Therapeutic Exercise;Functional mobility training;UE/LE Strength taining/ROM;Skin care/wound management;Discharge planning;Neuromuscular re-education;UE/LE Coordination  activities PT Transfers Anticipated Outcome(s): Mod I PT Locomotion Anticipated Outcome(s): supervision PT Recommendation Follow Up Recommendations: Outpatient PT Patient destination: Home Equipment Recommended: To be determined  Skilled Therapeutic Intervention Evaluation completed (see details above and  below) with education on PT POC and goals and individual treatment initiated with focus on functional transfers, bed mobility, ambulation and safety. Pt participated in physical therapy evaluation as detailed below. Pt seated in recliner, agreeable to therapy tx and denies pain. Pt performed stand pivot to recliner with min assist. Pt transported to gym and performed stand pivot to car with min assist. Pt ambulated this session without AD x 60 ft with min assist, L knee hyperextension noted with tactile cues to correct. Therapist added heel wedge to L shoe, decreased frequency of hyperextension noted. Pt ambulated with RW this session x 150 ft and min assist. PT ascended/descended steps x 4 with B handrails, step to pattern. Pt participated in berg balance test as detailed below, scored 33/56 and discussed results with the pt. Pt become nauseous and dizzy towards end of session with a cramp in his side, BP 143/85. Pt transported back to room and transferred to bed, performed bed mobility with supervision. RN made aware of pts current status. Pt left supine with needs in reach and bed alarm set.     PT Evaluation Precautions/Restrictions Precautions Precautions: Fall Restrictions Weight Bearing Restrictions: No General   Vital Signs  Pain Pain Assessment Pain Scale: 0-10 Pain Score: 3  Pain Type: Acute pain Pain Location: Head Pain Orientation: Anterior;Mid Pain Descriptors / Indicators: Headache Pain Onset: On-going Pain Intervention(s): Repositioned Home Living/Prior Functioning Home Living Available Help at Discharge: Family;Available 24 hours/day Type of Home: Mobile home Home  Access: Stairs to enter Entrance Stairs-Number of Steps: 3 Entrance Stairs-Rails: None Home Layout: One level Bathroom Shower/Tub: Optometrist: Yes  Lives With: Spouse Prior Function Level of Independence: Independent with basic ADLs  Able to Take Stairs?: Yes Driving: Yes Vocation: Full time employment Comments: working and driving  Vision/Perception  Vision - Assessment Eye Alignment: Within Functional Limits Ocular Range of Motion: Within Functional Limits Alignment/Gaze Preference: Within Defined Limits Tracking/Visual Pursuits: Able to track stimulus in all quads without difficulty Saccades: Within functional limits Convergence: Within functional limits Perception Perception: Within Functional Limits Praxis Praxis: Intact  Cognition Overall Cognitive Status: Within Functional Limits for tasks assessed Arousal/Alertness: Awake/alert Orientation Level: Oriented X4 Attention: Focused;Sustained Focused Attention: Appears intact Sustained Attention: Appears intact Memory: Appears intact Decreased Short Term Memory: Verbal basic Awareness: Appears intact Safety/Judgment: Appears intact Sensation Sensation Light Touch: Appears Intact Hot/Cold: Appears Intact Proprioception: Appears Intact Stereognosis: Appears Intact Additional Comments: Sensation intact in BLEs Coordination Gross Motor Movements are Fluid and Coordinated: No Fine Motor Movements are Fluid and Coordinated: No Coordination and Movement Description: impaired coordination L LE Motor  Motor Motor: Hemiplegia Motor - Skilled Clinical Observations: mild left hemiparesis  Mobility Bed Mobility Bed Mobility: Supine to Sit;Sit to Supine Supine to Sit: Supervision/Verbal cueing Sit to Supine: Supervision/Verbal cueing Transfers Transfers: Sit to Stand;Stand Pivot Transfers Sit to Stand: Minimal Assistance - Patient > 75% Stand Pivot Transfers:  Minimal Assistance - Patient > 75% Locomotion  Gait Ambulation: Yes Gait Assistance: Minimal Assistance - Patient > 75% Gait Distance (Feet): 150 Feet Assistive device: None;Rolling walker Gait Gait: Yes Gait Pattern: Decreased stance time - left;Left foot flat;Left genu recurvatum Gait velocity: decreased Stairs / Additional Locomotion Stairs: Yes Stairs Assistance: Minimal Assistance - Patient > 75% Stair Management Technique: Two rails Number of Stairs: 4 Height of Stairs: 6  Trunk/Postural Assessment  Cervical Assessment Cervical Assessment: Exceptions to WFL(forward head posture) Thoracic Assessment Thoracic Assessment: Within Functional Limits Lumbar Assessment Lumbar Assessment:  Within Functional Limits Postural Control Postural Control: Deficits on evaluation Protective Responses: impaired  Balance Balance Balance Assessed: Yes Standardized Balance Assessment Standardized Balance Assessment: Berg Balance Test Berg Balance Test Sit to Stand: Able to stand without using hands and stabilize independently Standing Unsupported: Able to stand 2 minutes with supervision Sitting with Back Unsupported but Feet Supported on Floor or Stool: Able to sit safely and securely 2 minutes Stand to Sit: Sits safely with minimal use of hands Transfers: Able to transfer safely, definite need of hands Standing Unsupported with Eyes Closed: Able to stand 10 seconds with supervision Standing Ubsupported with Feet Together: Needs help to attain position and unable to hold for 15 seconds From Standing, Reach Forward with Outstretched Arm: Can reach forward >12 cm safely (5") From Standing Position, Pick up Object from Floor: Able to pick up shoe, needs supervision From Standing Position, Turn to Look Behind Over each Shoulder: Looks behind one side only/other side shows less weight shift Turn 360 Degrees: Needs assistance while turning Standing Unsupported, Alternately Place Feet on  Step/Stool: Able to complete >2 steps/needs minimal assist Standing Unsupported, One Foot in Front: Needs help to step but can hold 15 seconds Standing on One Leg: Tries to lift leg/unable to hold 3 seconds but remains standing independently Total Score: 33 Static Sitting Balance Static Sitting - Level of Assistance: 5: Stand by assistance Dynamic Sitting Balance Dynamic Sitting - Level of Assistance: 5: Stand by assistance Static Standing Balance Static Standing - Level of Assistance: 4: Min assist Dynamic Standing Balance Dynamic Standing - Level of Assistance: 3: Mod assist Extremity Assessment  RLE Assessment RLE Assessment: Within Functional Limits LLE Assessment LLE Assessment: Exceptions to [facility] Passive Range of Motion (PROM) Comments: limited L ankle DF to neutral  Active Range of Motion (AROM) Comments: limited secondary to weakness LLE Strength Left Hip Flexion: 3+/5 Left Hip Extension: 3+/5 Left Knee Flexion: 3+/5 Left Knee Extension: 4/5 Left Ankle Dorsiflexion: 1/5 Left Ankle Plantar Flexion: 1/5    Refer to Care Plan for Long Term Goals  Recommendations for other services: None   Discharge Criteria: Patient will be discharged from PT if patient refuses treatment 3 consecutive times without medical reason, if treatment goals not met, if there is a change in medical status, if patient makes no progress towards goals or if patient is discharged from hospital.  The above assessment, treatment plan, treatment alternatives and goals were discussed and mutually agreed upon: by patient  Netta Corrigan, PT, DPT 08/15/2018, 11:36 AM

## 2018-08-15 NOTE — Evaluation (Signed)
Occupational Therapy Assessment and Plan  Patient Details  Name: [name] MRN: [id number] Date of Birth: Sep 16, 1961  OT Diagnosis: abnormal posture, hemiplegia affecting non-dominant side and muscle weakness (generalized) Rehab Potential: Rehab Potential (ACUTE ONLY): Excellent ELOS: 5-7 days   Today's Date: 08/15/2018 OT Individual Time: 0901-1000 OT Individual Time Calculation (min): 59 min     Problem List:  Patient Active Problem List   Diagnosis Date Noted  . CKD (chronic kidney disease), stage II   . Benign prostatic hyperplasia   . Hyperlipidemia 08/14/2018  . Tobacco use disorder 08/14/2018  . Obesity 08/14/2018  . GERD (gastroesophageal reflux disease) 08/14/2018  . Leg cramp, left 08/14/2018  . Elevated blood pressure reading with diagnosis of hypertension   . Diastolic dysfunction   . Polysubstance abuse ([name])   . Right pontine cerebrovascular accident [facility]) s/p tPA 08/11/2018  . Hypertension 02/17/2014    Past Medical History:  Past Medical History:  Diagnosis Date  . Acid reflux   . Hypertension   . Kidney stone    Past Surgical History:  Past Surgical History:  Procedure Laterality Date  . CHOLECYSTECTOMY      Assessment & Plan Clinical Impression: Patient is a 57 y.o. year old male with recent admission to the hospital on 08/11/2018 with left-sided weakness of acute onset.  Cranial CT scan reviewed, unremarkable for acute intracranial process.  Patient did receive TPA.  Urine drug screen positive cocaine and marijuana.  CT angiogram of head and neck showed no large vessel occlusion high-grade stenosis or aneurysm.  MRI showed acute pontine infarction.  Patient transferred to CIR on 08/14/2018 .    Patient currently requires min with basic self-care skills secondary to muscle weakness, impaired timing and sequencing, unbalanced muscle activation, decreased coordination and decreased motor planning and decreased standing balance, decreased postural  control, hemiplegia and decreased balance strategies.  Prior to hospitalization, patient could complete  with independent .  Patient will benefit from skilled intervention to decrease level of assist with basic self-care skills, increase independence with basic self-care skills and increase level of independence with iADL prior to discharge home with care partner.  Anticipate patient will require intermittent supervision and follow up outpatient.  OT - End of Session Activity Tolerance: Endurance does not limit participation in activity Endurance Deficit: No OT Assessment Rehab Potential (ACUTE ONLY): Excellent OT Patient demonstrates impairments in the following area(s): Balance;Motor OT Basic ADL's Functional Problem(s): Grooming;Bathing;Dressing OT Transfers Functional Problem(s): Tub/Shower;Toilet OT Additional Impairment(s): Fuctional Use of Upper Extremity OT Plan OT Intensity: Minimum of 1-2 x/day, 45 to 90 minutes OT Frequency: 5 out of 7 days OT Duration/Estimated Length of Stay: 5-7 days OT Treatment/Interventions: Balance/vestibular training;Community reintegration;DME/adaptive equipment instruction;Discharge planning;Disease mangement/prevention;Pain management;Self Care/advanced ADL retraining;Therapeutic Activities;Functional electrical stimulation;Functional mobility training;Patient/family education;Therapeutic Exercise;UE/LE Coordination activities;UE/LE Strength taining/ROM;Neuromuscular re-education OT Self Feeding Anticipated Outcome(s): modified independent OT Basic Self-Care Anticipated Outcome(s): modified independent OT Toileting Anticipated Outcome(s): modified independent OT Bathroom Transfers Anticipated Outcome(s): modified independent OT Recommendation Patient destination: Home Follow Up Recommendations: Outpatient OT Equipment Recommended: To be determined   Skilled Therapeutic Intervention Pt began working on selfcare retraining shower level during session.   He was able to complete transfer into the shower with mod assist and no assistive device.  Decreased efficiency with clearing the LLE when advancing the LE as well as decreased knee control with the left knee hyperextending with each step.  Min assist for washing his LB sit to stand with supervision for UB.  He transitioned  over to the 3:1 in the shower for dressing.  He was able to donn his shirt with supervision and then donned his pants with min assist.  He transitioned out to the recliner for a lower seat, where he donned his gripper socks.  Had him complete functional mobility to the sink for brushing his hair with mod assist for mobility.  Issued RW and had pt ambulate to and from the nurses station.  He was able to complete with min guard assist.  Noted frequent toe drag on the left foot when advancing the LE as well as knee hyperextension and decreased ability to maintain left hip hyperextension.  Finished session with pt in the bedside recliner with call button and phone in reach and spouse present.  Educated pt on working on opening his hand, completing opposition exercises, and working on flipping, dealing, and playing with his [name] cards.  Pt and spouse voiced understanding.    OT Evaluation Precautions/Restrictions  Precautions Precautions: Fall Restrictions Weight Bearing Restrictions: No   Pain Pain Assessment Pain Scale: 0-10 Pain Score: 3  Pain Type: Acute pain Pain Location: Head Pain Orientation: Anterior;Mid Pain Descriptors / Indicators: Headache Pain Onset: On-going Pain Intervention(s): Repositioned Home Living/Prior Functioning Home Living Family/patient expects to be discharged to:: Private residence Living Arrangements: Spouse/significant other Available Help at Discharge: Family, Available 24 hours/day Type of Home: Mobile home Home Access: Stairs to enter Entrance Stairs-Rails: None Home Layout: One level Bathroom Shower/Tub: Development worker, community: Yes  Lives With: Spouse IADL History Homemaking Responsibilities: No Current License: Yes Mode of Transportation: Car Occupation: Full time employment 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 Leisure and Hobbies: fishing and hunting Prior Function Level of Independence: Independent with basic ADLs Driving: Yes Vocation: Full time employment ADL ADL Eating: Independent Where Assessed-Eating: Chair Grooming: Minimal assistance Where Assessed-Grooming: Standing at sink Upper Body Bathing: Supervision/safety Where Assessed-Upper Body Bathing: Shower Lower Body Bathing: Minimal assistance Where Assessed-Lower Body Bathing: Shower Upper Body Dressing: Supervision/safety Where Assessed-Upper Body Dressing: Chair Lower Body Dressing: Minimal assistance Where Assessed-Lower Body Dressing: Chair Toileting: Minimal assistance Where Assessed-Toileting: Glass blower/designer: Psychiatric nurse Method: Ambulating(with RW use) Science writer: Geophysical data processor: Environmental education officer Method: Ambulating(No assistive device) Youth worker: Civil engineer, contracting with back Vision Baseline Vision/History: No visual deficits Vision Assessment?: Yes Eye Alignment: Within Functional Limits Ocular Range of Motion: Within Functional Limits Alignment/Gaze Preference: Within Defined Limits Tracking/Visual Pursuits: Able to track stimulus in all quads without difficulty Saccades: Within functional limits Convergence: Within functional limits Visual Fields: No apparent deficits Perception  Perception: Within Functional Limits Praxis Praxis: Intact Cognition Overall Cognitive Status: Within Functional Limits for tasks assessed Arousal/Alertness: Awake/alert Orientation Level: Person;Place;Situation Person: Oriented Place: Oriented Situation: Oriented Year: 2019 Month: October Day  of Week: Correct Memory: Appears intact Decreased Short Term Memory: Verbal basic Immediate Memory Recall: Sock;Blue;Bed Memory Recall: Sock;Blue;Bed Memory Recall Sock: Without Cue Memory Recall Blue: Without Cue Memory Recall Bed: Without Cue Attention: Focused;Sustained Focused Attention: Appears intact Sustained Attention: Appears intact Sensation Sensation Light Touch: Appears Intact Hot/Cold: Appears Intact Proprioception: Appears Intact Stereognosis: Appears Intact Additional Comments: Sensation intact in BUEs Coordination Gross Motor Movements are Fluid and Coordinated: No Fine Motor Movements are Fluid and Coordinated: No Coordination and Movement Description: Brunnstrum stage V level in the left arm and hand.  He uses it as a active assist with all selfcare tasks at this time.  Motor  Motor Motor: Hemiplegia Motor - Skilled Clinical Observations: mild left hemiparesis Mobility  Transfers Sit to Stand: Minimal Assistance - Patient > 75%  Trunk/Postural Assessment  Cervical Assessment Cervical Assessment: Exceptions to [facility] Thoracic Assessment Thoracic Assessment: Within Functional Limits Lumbar Assessment Lumbar Assessment: Within Functional Limits Postural Control Postural Control: Deficits on evaluation Protective Responses: impaired  Balance Balance Balance Assessed: Yes Standardized Balance Assessment Standardized Balance Assessment: Berg Balance Test Berg Balance Test Sit to Stand: Able to stand without using hands and stabilize independently Standing Unsupported: Able to stand 2 minutes with supervision Sitting with Back Unsupported but Feet Supported on Floor or Stool: Able to sit safely and securely 2 minutes Stand to Sit: Sits safely with minimal use of hands Transfers: Able to transfer safely, definite need of hands Standing Unsupported with Eyes Closed: Able to stand 10 seconds with supervision Standing Ubsupported with Feet Together: Needs help  to attain position and unable to hold for 15 seconds From Standing, Reach Forward with Outstretched Arm: Can reach forward >12 cm safely (5") From Standing Position, Pick up Object from Floor: Able to pick up shoe, needs supervision From Standing Position, Turn to Look Behind Over each Shoulder: Looks behind one side only/other side shows less weight shift Turn 360 Degrees: Needs assistance while turning Standing Unsupported, Alternately Place Feet on Step/Stool: Able to complete >2 steps/needs minimal assist Standing Unsupported, One Foot in Front: Needs help to step but can hold 15 seconds Standing on One Leg: Tries to lift leg/unable to hold 3 seconds but remains standing independently Total Score: 33 Static Sitting Balance Static Sitting - Balance Support: Feet supported Static Sitting - Level of Assistance: 7: Independent Dynamic Sitting Balance Dynamic Sitting - Balance Support: During functional activity Dynamic Sitting - Level of Assistance: 5: Stand by assistance Static Standing Balance Static Standing - Balance Support: During functional activity Static Standing - Level of Assistance: 4: Min assist Dynamic Standing Balance Dynamic Standing - Balance Support: During functional activity Dynamic Standing - Level of Assistance: 3: Mod assist Extremity/Trunk Assessment RUE Assessment RUE Assessment: Within Functional Limits LUE Assessment LUE Assessment: Exceptions to [facility] Passive Range of Motion (PROM) Comments: WFLs Active Range of Motion (AROM) Comments: Brunnstrum stage V movement in the hand and arm with isolated movement in the elbow and wrist.  Still with some synergy pattern with shoulder flexion but with compensation can achieve 100 degres.  Gross digit flexion AROM WFLS with extension AROM approximately 80% of full range.  He can opposte the thumb to the first digit but not any of the others.       Refer to Care Plan for Long Term Goals  Recommendations for other  services: None    Discharge Criteria: Patient will be discharged from OT if patient refuses treatment 3 consecutive times without medical reason, if treatment goals not met, if there is a change in medical status, if patient makes no progress towards goals or if patient is discharged from hospital.  The above assessment, treatment plan, treatment alternatives and goals were discussed and mutually agreed upon: by patient and by family  Johnathan Heskett OTR/L 08/15/2018, 12:54 PM

## 2018-08-15 NOTE — Progress Notes (Signed)
Physical Therapy Session Note  Patient Details  Name: [name] MRN: [id number] Date of Birth: 01-16-1961  Today's Date: 08/15/2018 PT Individual Time: 1400-1456 PT Individual Time Calculation (min): 56 min   Short Term Goals: Week 1:  PT Short Term Goal 1 (Week 1): STG=LTG due to ELOS  Skilled Therapeutic Interventions/Progress Updates:  Pt presented in bed sleeping but easily aroused. agreeable to therapy. Performed bed mobility with features supervision level. Pt c/o dizziness with all sitting activities throughout session with vitals remaining stable.  Squat pivot transfer to w/c minA with cues for hand placement. Attempted w/c propulsion with BUE for strengthening and endurance however pt unable to maintain grip on L rim. Pt transported to rehab gym for energy conservation. Applied resistance band to L w/c rim for improved tactile feedback and pt was able to propel 20 ft with minA. Participated in gait training with trial of AFO. Gait trials 46f x 2 with AFO with good L foot clearance however continued to demonstrate L knee instability. Participated in STS from mat with tactile feedback to prevent hyperextension. Performed lateral wt shifting for increased L wt bearing with tactile cues for knee control. Pt propelled approx 1055fwith BUE and x 3 rest breaks due to fatigue. Transported remaining distance to room and pt performed squat pivot transfer to return to bed with CGA. Returned to supine with HOB slightly elevated and supervision. Pt left in bed with bed alarm on, call bell within reach and current needs met.  Therapy Documentation Precautions:  Precautions Precautions: Fall Restrictions Weight Bearing Restrictions: No General:   Vital Signs:   Pain:   Mobility:   Locomotion : Gait Ambulation: Yes Gait Assistance: Minimal Assistance - Patient > 75% Gait Distance (Feet): 150 Feet Assistive device: None;Rolling walker Gait Gait: Yes Gait Pattern: Decreased stance time  - left;Left foot flat;Left genu recurvatum Gait velocity: decreased Stairs / Additional Locomotion Stairs: Yes Stairs Assistance: Minimal Assistance - Patient > 75% Stair Management Technique: Two rails Number of Stairs: 4 Height of Stairs: 6  Trunk/Postural Assessment : Cervical Assessment Cervical Assessment: Exceptions to [facility] Thoracic Assessment: Within Functional Limits Lumbar Assessment Lumbar Assessment: Within Functional Limits Postural Control Postural Control: Deficits on evaluation Protective Responses: impaired  Balance: Balance Balance Assessed: Yes Standardized Balance Assessment Standardized Balance Assessment: Berg Balance Test Berg Balance Test Sit to Stand: Able to stand without using hands and stabilize independently Standing Unsupported: Able to stand 2 minutes with supervision Sitting with Back Unsupported but Feet Supported on Floor or Stool: Able to sit safely and securely 2 minutes Stand to Sit: Sits safely with minimal use of hands Transfers: Able to transfer safely, definite need of hands Standing Unsupported with Eyes Closed: Able to stand 10 seconds with supervision Standing Ubsupported with Feet Together: Needs help to attain position and unable to hold for 15 seconds From Standing, Reach Forward with Outstretched Arm: Can reach forward >12 cm safely (5") From Standing Position, Pick up Object from Floor: Able to pick up shoe, needs supervision From Standing Position, Turn to Look Behind Over each Shoulder: Looks behind one side only/other side shows less weight shift Turn 360 Degrees: Needs assistance while turning Standing Unsupported, Alternately Place Feet on Step/Stool: Able to complete >2 steps/needs minimal assist Standing Unsupported, One Foot in Front: Needs help to step but can hold 15 seconds Standing on One Leg: Tries to lift leg/unable to hold 3 seconds but remains standing independently Total Score: 33 Static Sitting  Balance Static  Sitting - Balance Support: Feet supported Static Sitting - Level of Assistance: 7: Independent Dynamic Sitting Balance Dynamic Sitting - Balance Support: During functional activity Dynamic Sitting - Level of Assistance: 5: Stand by assistance Static Standing Balance Static Standing - Balance Support: During functional activity Static Standing - Level of Assistance: 4: Min assist Dynamic Standing Balance Dynamic Standing - Balance Support: During functional activity Dynamic Standing - Level of Assistance: 3: Mod assist Exercises:   Other Treatments:      Therapy/Group: Individual Therapy  Illa Enlow  Ambrea Hegler, PTA  08/15/2018, 2:59 PM

## 2018-08-15 NOTE — Progress Notes (Signed)
[facility] PHYSICAL MEDICINE & REHABILITATION PROGRESS NOTE  Subjective/Complaints: Patient seen laying in bed this AM.  He states he did not sleep well overnight due to chronic back pain.    ROS: Denies CP, SOB, nausea, vomiting, diarrhea.  Objective: Vital Signs: Blood pressure 136/79, pulse 85, temperature 97.8 F (36.6 C), temperature source Oral, resp. rate 20, height 5\' 7"  (1.702 m), weight 90.3 kg, SpO2 97 %. No results found. Recent Labs    08/13/18 0441 08/15/18 0457  WBC 6.3 6.6  HGB 13.2 13.4  HCT 40.3 40.1  PLT 214 211   Recent Labs    08/13/18 0441 08/15/18 0457  NA 137 136  K 3.8 3.8  CL 105 108  CO2 24 20*  GLUCOSE 112* 108*  BUN 25* 26*  CREATININE 1.07 1.05  CALCIUM 8.7* 8.8*    Physical Exam: BP 136/79 (BP Location: Left Arm)   Pulse 85   Temp 97.8 F (36.6 C) (Oral)   Resp 20   Ht 5\' 7"  (1.702 m)   Wt 90.3 kg   SpO2 97%   BMI 31.18 kg/m  Constitutional: NAD.  Vital signs reviewed. HENT: Normocephalic and atraumatic.  Eyes: EOMI.  No discharge. Cardiovascular: Normal rate, regular rhythm.  No JVD. Respiratory: Effort normal.  Clear. GI: Bowel sounds are normal. He exhibits no distension.  Musculoskeletal: No edema or tenderness in extremities  Neurological: He is alert and oriented.  Follows commands.   Fair awareness of deficits Motor: RUE/RLE: 5/5 proximal to distal LUE/LLE: 4/5 proximal to distal   Skin: Skin is warm and dry.  Psychiatric: He has a normal mood and affect. His behavior is normal.   Assessment/Plan: 1. Functional deficits secondary to right pontine infarct status post TPA which require 3+ hours per day of interdisciplinary therapy in a comprehensive inpatient rehab setting.  Physiatrist is providing close team supervision and 24 hour management of active medical problems listed below.  Physiatrist and rehab team continue to assess barriers to discharge/monitor patient progress toward functional and medical  goals  Care Tool:  Bathing              Bathing assist       Upper Body Dressing/Undressing Upper body dressing   What is the patient wearing?: Hospital gown only    Upper body assist      Lower Body Dressing/Undressing Lower body dressing    Lower body dressing activity did not occur: N/A What is the patient wearing?: Pants     Lower body assist       Toileting Toileting Toileting Activity did not occur (Clothing management and hygiene only): N/A (no void or bm)  Toileting assist Assist for toileting: Set up assist(pt using urinal)     Transfers Chair/bed transfer  Transfers assist  Chair/bed transfer activity did not occur: N/A  Chair/bed transfer assist level: Contact Guard/Touching assist     Locomotion Ambulation   Ambulation assist              Walk 10 feet activity   Assist           Walk 50 feet activity   Assist           Walk 150 feet activity   Assist           Walk 10 feet on uneven surface  activity   Assist           Wheelchair     Assist  Wheelchair 50 feet with 2 turns activity    Assist            Wheelchair 150 feet activity     Assist           Medical Problem List and Plan: 1.  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  Aspirin and Plavix x3 weeks then aspirin alone  Begin CIR 2.  DVT Prophylaxis/Anticoagulation: Subcutaneous Lovenox.  Monitor for any bleeding episodes 3. Pain Management: Neurontin 300 mg every 8 hours as needed 4. Mood: Provide emotional support 5. Neuropsych: This patient is capable of making decisions on his own behalf. 6. Skin/Wound Care: Routine skin checks 7. Fluids/Electrolytes/Nutrition: Routine in and outs 8.  Hypertension.  Norvasc 5 mg daily, Lotensin 10 mg daily.  Monitor with increased mobility  Monitor with increased mobility 9.  Hyperlipidemia.  Lipitor 10.  Tobacco as well as  polysubstance abuse.  Urine drug screen positive cocaine and marijuana.  Continue NicoDerm patch.  Provide counseling 11.  BPH.  Flomax 0.4 mg nightly.    PVR pending 12.  GERD.  Prilosec  13.  CKD stage II  Creatinine 1.05 on 10/22  Continue to monitor  LOS: 1 days A FACE TO FACE EVALUATION WAS PERFORMED  Amye Grego Karis Juba 08/15/2018, 8:10 AM

## 2018-08-16 ENCOUNTER — Inpatient Hospital Stay (HOSPITAL_COMMUNITY): Payer: Self-pay | Admitting: Physical Therapy

## 2018-08-16 ENCOUNTER — Encounter (HOSPITAL_COMMUNITY): Payer: Self-pay | Admitting: Psychology

## 2018-08-16 ENCOUNTER — Inpatient Hospital Stay (HOSPITAL_COMMUNITY): Payer: Self-pay | Admitting: Occupational Therapy

## 2018-08-16 DIAGNOSIS — R0989 Other specified symptoms and signs involving the circulatory and respiratory systems: Secondary | ICD-10-CM

## 2018-08-16 MED ORDER — ALUM & MAG HYDROXIDE-SIMETH 200-200-20 MG/5ML PO SUSP
30.0000 mL | Freq: Four times a day (QID) | ORAL | Status: DC | PRN
  Administered 2018-08-16 – 2018-08-19 (×3): 30 mL via ORAL
  Filled 2018-08-16 (×3): qty 30

## 2018-08-16 NOTE — Patient Care Conference (Signed)
Inpatient RehabilitationTeam Conference and Plan of Care Update Date: 08/16/2018   Time: 2:30 PM    Patient Name: [name]      Medical Record Number: [id number]  Date of Birth: November 04, 1960 Sex: Male         Room/Bed: 4M04C/4M04C-01 Payor Info: Payor: MEDICAID POTENTIAL / Plan: MEDICAID POTENTIAL / Product Type: *No Product type* /    Admitting Diagnosis: cva  Admit Date/Time:  08/14/2018  5:47 PM Admission Comments: No comment available   Primary Diagnosis:  <principal problem not specified> Principal Problem: <principal problem not specified>  Patient Active Problem List   Diagnosis Date Noted  . Labile blood pressure   . CKD (chronic kidney disease), stage II   . Benign prostatic hyperplasia   . Hyperlipidemia 08/14/2018  . Tobacco use disorder 08/14/2018  . Obesity 08/14/2018  . GERD (gastroesophageal reflux disease) 08/14/2018  . Leg cramp, left 08/14/2018  . Elevated blood pressure reading with diagnosis of hypertension   . Diastolic dysfunction   . Polysubstance abuse (HCC)   . Right pontine cerebrovascular accident [facility]) s/p tPA 08/11/2018  . Hypertension 02/17/2014    Expected Discharge Date: Expected Discharge Date: 08/20/18  Team Members Present: Physician leading conference: Dr. Maryla Morrow Social Worker Present: Staci Acosta, LCSW Nurse Present: Chana Bode, RN PT Present: Grier Rocher, PT OT Present: Perrin Maltese, OT SLP Present: Jackalyn Lombard, SLP PPS Coordinator present : Tora Duck, RN, CRRN     Current Status/Progress Goal Weekly Team Focus  Medical   Left-sided weakness secondary to right pontine infarction status post TPA.  Improve mobility, transfers, BP, BPH  See above   Bowel/Bladder   Continent of Bowel and Bladder; patient reports last stool 10/17 prn sorbitol; LBM 08/16/18  Remain continent of Bowel and Bladder  Assess and assist with toileting q shift and prn   Swallow/Nutrition/ Hydration             ADL's   Supervision for UB  selfcare, min assist for LB selfcare sit to stand and for transfers with use of the RW.  LUE function at a Brunnstrum stage V level.    modified independent    selfcare retraining, transfer training, balance retraining, neuromuscular re-education, pt/family education, DME education   Mobility   Mod I bed mobility. supervision assist - CGA for transfers and ambulation with SPC.    Supervision assist for ambulation and stairs to access home . mod I bed mobility and transfers with LRAD       Communication             Safety/Cognition/ Behavioral Observations            Pain   complain of headache; prn tylenol given  Pain < or =3  Assess patient for pain q shift and as needed   Skin   small bruise to rt lower abdomen, scratches to BLE   Free of skin breakdown  Assess skin q shift and prn    Rehab Goals Patient on target to meet rehab goals: Yes Rehab Goals Revised: none - pt's first conference *See Care Plan and progress notes for long and short-term goals.     Barriers to Discharge  Current Status/Progress Possible Resolutions Date Resolved   Physician    Medical stability     See above  Therapies, PVRs ordered, optimize BP meds      Nursing                  PT  OT                  SLP                SW                Discharge Planning/Teaching Needs:  Pt to return to his home with his wife to provide supervision and assistance as needed.  Wife is always present and can participate in family education.   Team Discussion:  Pt with up and down blood pressures, so no set trend for Dr. Allena Katz to make adjustments to his medications.  Pt with BPH hx, so MD ordered PVRs.  Chronic kidney disease is stable.  Pt is continent and does not have pain/skin issues.  He is taking tylenol for headaches.  Pt is S for self care, except AFO.  He is min guard with transfers with cane.  OT is working on coordination and strength with left arm.  OT goals are mod I.  Pt is  supervision with gait using cane and L AFO in various environments.  Pt has better knee control with AFO.  PT goals are supervision overall, mod I bed mobility.  Revisions to Treatment Plan:  none    Continued Need for Acute Rehabilitation Level of Care: The patient requires daily medical management by a physician with specialized training in physical medicine and rehabilitation for the following conditions: Daily direction of a multidisciplinary physical rehabilitation program to ensure safe treatment while eliciting the highest outcome that is of practical value to the patient.: Yes Daily medical management of patient stability for increased activity during participation in an intensive rehabilitation regime.: Yes Daily analysis of laboratory values and/or radiology reports with any subsequent need for medication adjustment of medical intervention for : Neurological problems;Blood pressure problems;Urological problems   I attest that I was present, lead the team conference, and concur with the assessment and plan of the team.   Maccoy Haubner, [place] Deck 08/16/2018, 3:05 PM

## 2018-08-16 NOTE — Progress Notes (Signed)
[facility] PHYSICAL MEDICINE & REHABILITATION PROGRESS NOTE  Subjective/Complaints: Patient seen sitting up at the edge of his bed this morning.  He states he slept well overnight.  He has increased awareness of his deficits after therapies yesterday.  Wife with questions regarding bracing.  ROS: Denies CP, SOB, nausea, vomiting, diarrhea.  Objective: Vital Signs: Blood pressure 140/83, pulse 83, temperature 97.9 F (36.6 C), temperature source Oral, resp. rate 18, height 5\' 7"  (1.702 m), weight 90.3 kg, SpO2 97 %. No results found. Recent Labs    08/15/18 0457  WBC 6.6  HGB 13.4  HCT 40.1  PLT 211   Recent Labs    08/15/18 0457  NA 136  K 3.8  CL 108  CO2 20*  GLUCOSE 108*  BUN 26*  CREATININE 1.05  CALCIUM 8.8*    Physical Exam: BP 140/83 (BP Location: Left Arm)   Pulse 83   Temp 97.9 F (36.6 C) (Oral)   Resp 18   Ht 5\' 7"  (1.702 m)   Wt 90.3 kg   SpO2 97%   BMI 31.18 kg/m  Constitutional: NAD.  Vital signs reviewed. HENT: Normocephalic and atraumatic.  Eyes: EOMI.  No discharge. Cardiovascular: RRR.  No JVD. Respiratory: Effort normal.  Clear. GI: Bowel sounds are normal. He exhibits no distension.  Musculoskeletal: No edema or tenderness in extremities  Neurological: He is alert and oriented.  Follows commands.   Fair awareness of deficits Motor: RUE/RLE: 5/5 proximal to distal LUE: 4/5 proximally, 3/5 wrist extension and handgrip LLE: 4/5 proximally,   2+/5 ankle dorsiflexion Skin: Skin is warm and dry.  Psychiatric: He has a normal mood and affect. His behavior is normal.   Assessment/Plan: 1. Functional deficits secondary to right pontine infarct status post TPA which require 3+ hours per day of interdisciplinary therapy in a comprehensive inpatient rehab setting.  Physiatrist is providing close team supervision and 24 hour management of active medical problems listed below.  Physiatrist and rehab team continue to assess barriers to  discharge/monitor patient progress toward functional and medical goals  Care Tool:  Bathing    Body parts bathed by patient: Right arm, Left arm, Chest, Abdomen, Front perineal area, Buttocks, Right upper leg, Left upper leg, Right lower leg, Left lower leg, Face         Bathing assist Assist Level: Minimal Assistance - Patient > 75%     Upper Body Dressing/Undressing Upper body dressing   What is the patient wearing?: Pull over shirt    Upper body assist Assist Level: Supervision/Verbal cueing    Lower Body Dressing/Undressing Lower body dressing    Lower body dressing activity did not occur: N/A What is the patient wearing?: Pants     Lower body assist Assist for lower body dressing: Minimal Assistance - Patient > 75%     Toileting Toileting Toileting Activity did not occur (Clothing management and hygiene only): N/A (no void or bm)  Toileting assist Assist for toileting: Set up assist     Transfers Chair/bed transfer  Transfers assist  Chair/bed transfer activity did not occur: N/A  Chair/bed transfer assist level: Minimal Assistance - Patient > 75%     Locomotion Ambulation   Ambulation assist      Assist level: Minimal Assistance - Patient > 75% Assistive device: Walker-rolling(30) Max distance: 80 ft   Walk 10 feet activity   Assist     Assist level: Minimal Assistance - Patient > 75%     Walk 50 feet activity  Assist    Assist level: Minimal Assistance - Patient > 75%      Walk 150 feet activity   Assist    Assist level: Minimal Assistance - Patient > 75% Assistive device: Walker-rolling    Walk 10 feet on uneven surface  activity   Assist     Assist level: Minimal Assistance - Patient > 75%     Wheelchair     Assist Will patient use wheelchair at discharge?: No             Wheelchair 50 feet with 2 turns activity    Assist            Wheelchair 150 feet activity     Assist            Medical Problem List and Plan: 1.  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  Aspirin and Plavix x3 weeks then aspirin alone  Continue CIR  WHO/PRAFO ordered 2.  DVT Prophylaxis/Anticoagulation: Subcutaneous Lovenox.  Monitor for any bleeding episodes 3. Pain Management: Neurontin 300 mg every 8 hours as needed 4. Mood: Provide emotional support 5. Neuropsych: This patient is capable of making decisions on his own behalf. 6. Skin/Wound Care: Routine skin checks 7. Fluids/Electrolytes/Nutrition: Routine in and outs 8.  Hypertension.  Norvasc 5 mg daily, Lotensin 10 mg daily.  Monitor with increased mobility  Labile on 10/23, will consider medication if persistently elevated 9.  Hyperlipidemia.  Lipitor 10.  Tobacco as well as polysubstance abuse.  Urine drug screen positive cocaine and marijuana.  Continue NicoDerm patch.  Provide counseling 11.  BPH.  Flomax 0.4 mg nightly.    PVR ordered 12.  GERD.  Prilosec  13.  CKD stage II  Creatinine 1.05 on 10/22  Continue to monitor  LOS: 2 days A FACE TO FACE EVALUATION WAS PERFORMED  Amen Dargis Karis Juba 08/16/2018, 8:12 AM

## 2018-08-16 NOTE — Progress Notes (Signed)
[facility]patient Rehabilitation [facility] Individual Statement of Services  Patient Name:  [name]  Date:  08/16/2018  Welcome to the Inpatient Rehabilitation Center.  Our goal is to provide you with an individualized program based on your diagnosis and situation, designed to meet your specific needs.  With this comprehensive rehabilitation program, you will be expected to participate in at least 3 hours of rehabilitation therapies Monday-Friday, with modified therapy programming on the weekends.  Your rehabilitation program will include the following services:  Physical Therapy (PT), Occupational Therapy (OT), 24 hour per day rehabilitation nursing, Neuropsychology, Case Management (Social Worker), Rehabilitation Medicine, Nutrition Services and Pharmacy Services  Weekly team conferences will be held on Wednesdays to discuss your progress.  Your Social Worker will talk with you frequently to get your input and to update you on team discussions.  Team conferences with you and your family in attendance may also be held.  Expected length of stay:  5 to 10 days  Overall anticipated outcome:  Supervision  Depending on your progress and recovery, your program may change. Your Social Worker will coordinate services and will keep you informed of any changes. Your Social Worker's name and contact numbers are listed  below.  The following services may also be recommended but are not provided by the Inpatient Rehabilitation Center:   Driving Evaluations  Home Health Rehabiltiation Services  Outpatient Rehabilitation Services  Vocational Rehabilitation   Arrangements will be made to provide these services after discharge if needed.  Arrangements include referral to agencies that provide these services.  Your insurance has been verified to be:  None at the time - financial counselor referral made Your primary doctor is:  Dr. Olena Leatherwood when you had insurance.  Boneta Lucks will give you information on community  clinic.  Pertinent information will be shared with your doctor and your insurance company.  Social Worker:  Staci Acosta, LCSW  [phone] or [phone]  Information discussed with and copy given to patient by: Elvera Lennox, 08/16/2018, 2:03 PM

## 2018-08-16 NOTE — Consult Note (Signed)
Neuropsychological Consultation   Patient:   [name]   DOB:   09-16-1961  MR Number:  [id number]  Location:  [facility] [address] CENTER B [address] [id number] [place] [address] Dept: [phone] Loc: [phone]           Date of Service:   08/16/2018  Start Time:   8 AM End Time:   9 AM   Provider/Observer:  Arley Phenix, Psy.D.       Clinical Neuropsychologist       Billing Code/Service: [phone] 4 Units  Chief Complaint:    [name] is a 57 year old male with a history of hypertension and tobacco abuse.  Presented on 08/11/2018 with left sided weakness.  Cranial CT was unremarkable for acute intracranial process.  Patient did receive TPA.  UDS positive for cocaine and marijuana.  MRI showed acute pontine infarction.  Patient with residual motor deficits post stroke and was referred for comprehensive rehab program.    Reason for Service:  The patient was referred for neuropsychological consultation due to coping with left sided motor deficits and concerns about possible substance abuse.  Below is the HPI for the current admission.  HPI: [name] is a 57 year old right-handed male history of hypertension and tobacco abuse.  Per chart review, patient, and wife, patient lives with spouse.  Mobile home 3 steps to entry.  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  Wife can assist as needed.  Presented 08/11/2018 with left-sided weakness of acute onset.  Cranial CT scan reviewed, unremarkable for acute intracranial process.  Patient did receive TPA.  Urine drug screen positive cocaine and marijuana.  CT angiogram of head and neck showed no large vessel occlusion high-grade stenosis or aneurysm.  MRI showed acute pontine infarction.  Echocardiogram with ejection fraction of 65% grade 1 diastolic dysfunction.  No defect or PFO identified.  Neurology consulted maintained on aspirin and Plavix x3 weeks  then aspirin alone.  Subtends Lovenox for DVT prophylaxis.  Therapy evaluations completed with recommendations of physical medicine rehab consult.  Patient was admitted for a comprehensive rehab program.  Current Status:  The patient reports that he is settling in to the rehab unit.  He reports that he did a lot of therapy already and is adapting well to the efforts.  He reports that his cocaine use was very limited without prior history of it's use.  He reports that he has used THC to mellow out for years but denies significant symptoms of anxiety or other psychiatric symptoms.    Behavioral Observation: [name]  presents as a 57 y.o.-year-old Right Caucasian Male who appeared his stated age. his dress was Appropriate and he was Well Groomed and his manners were Appropriate to the situation.  his participation was indicative of Appropriate and Attentive behaviors.  There were any physical disabilities noted.  he displayed an appropriate level of cooperation and motivation.     Interactions:    Active Appropriate and Attentive  Attention:   within normal limits and attention span and concentration were age appropriate  Memory:   within normal limits; recent and remote memory intact  Visuo-spatial:  not examined  Speech (Volume):  normal  Speech:   normal; normal  Thought Process:  Coherent and Relevant  Though Content:  WNL; not suicidal and not homicidal  Orientation:   person, place, time/date and situation  Judgment:   Fair  Planning:   Poor  Affect:    Appropriate  Mood:    Euthymic  Insight:   Fair  Intelligence:   Normal  Medical History:   Past Medical History:  Diagnosis Date  . Acid reflux   . Hypertension   . Kidney stone             Abuse/Trauma History: Patient did not report past history of abuse or trauma  Psychiatric History:  Patient denies SI or HI and denies significant symptoms of anxiety or depression.  Reports that marijuana was to "mellow"  self out and that cocaine was very limited and very recent use without past use or abuse.    Family Med/Psych History:  Family History  Problem Relation Age of Onset  . Hypertension Father   . Heart disease Father   . Hyperlipidemia Father   . Diabetes Father     Risk of Suicide/Violence: low Patient denies SI or HI  Impression/DX:  [name] is a 57 year old male with a history of hypertension and tobacco abuse.  Presented on 08/11/2018 with left sided weakness.  Cranial CT was unremarkable for acute intracranial process.  Patient did receive TPA.  UDS positive for cocaine and marijuana.  MRI showed acute pontine infarction.  Patient with residual motor deficits post stroke and was referred for comprehensive rehab program.    The patient reports that he is settling in to the rehab unit.  He reports that he did a lot of therapy already and is adapting well to the efforts.  He reports that his cocaine use was very limited without prior history of it's use.  He reports that he has used THC to mellow out for years but denies significant symptoms of anxiety or other psychiatric symptoms.    Diagnosis:    Pontine Stroke.         Electronically Signed   _______________________ Arley Phenix, Psy.D.

## 2018-08-16 NOTE — Progress Notes (Signed)
Social Work Patient ID: [name], male   DOB: February 03, 1961, 57 y.o.   MRN: [id number]   CSW met with pt and his wife to update them on team conference discussion and targeted d/c date of 08-20-18.  They are pleased with this, as pt is ready to be outside.  CSW will begin d/c planning process and gave wife social security disability application for her to help pt complete.  CSW will assist with medical piece of this and get a telephone appointment scheduled.  CSW remains available to assist as needed.

## 2018-08-16 NOTE — Progress Notes (Signed)
Physical Therapy Session Note  Patient Details  Name: [name] MRN: [id number] Date of Birth: Mar 06, 1961  Today's Date: 08/16/2018 PT Individual Time: 1005-1100 AND 1300-1327 PT Individual Time Calculation (min): 55 min and 27 min   Short Term Goals: Week 1:  PT Short Term Goal 1 (Week 1): STG=LTG due to ELOS  Skilled Therapeutic Interventions/Progress Updates: _0  Pt received sitting in WC and agreeable to PT  Gait training with RW and L anterior support [place] AFO x 168f. Additional gait training with RW posterior support, GRF AFO x 155f Significant improvement and foot clearance and knee stability.   Stair management training with BUE support x 12 with supervision assist and min cues for safety awareness and improved step to gait pattern.    Additional gait training with SPC x [id number] min assist progressing to supervision assist From PT. Cues for 3 point gait pattern and improved step height on the LLE.   Dynamic balacne training with 1-0 UE support and min-supervision assist from PT to place and remove 6 clothes pins on basket ball net. Cues for weight shifting over the LLE and improved shoulder mechanics to increase shoulder ROM with lateral reaches. .   Patient returned to room and [facility] and all needs met.     Session 2.   Pt received supine in bed and agreeable to PT. Supine>sit transfer without assist and or cues.   Gait training through various environments including hall of hospital, cement side walk by hospital entrance, and food court with LAFO, SPC [facility]upervision assist. Min cues for AD management and safety awareness.   [facility] returned to room and [facility] and all needs met.        Therapy Documentation Precautions:  Precautions Precautions: Fall Restrictions Weight Bearing Restrictions: No Pain: 0/10    Therapy/Group: Individual Therapy  AustiLorie Phenix3/2019, 1:40 PM

## 2018-08-16 NOTE — Progress Notes (Signed)
Occupational Therapy Session Note  Patient Details  Name: [name] MRN: [id number] Date of Birth: 08-19-1961  Today's Date: 08/16/2018 OT Individual Time: 4098-1191 OT Individual Time Calculation (min): 44 min    Short Term Goals: Week 1:  OT Short Term Goal 1 (Week 1): STGs equal to LTGs set at modified independent based on ELOS.  Skilled Therapeutic Interventions/Progress Updates:    Pt completed functional mobility with use of the cane and left AFO with supervision.  Once in the gym had pt work on weightbearing over the LUE in quadriped while reaching with the RUE.  Pt demonstrated slight elbow flexion in the LUE when completing functional reaching with the RUE.  Transitioned to sitting with work on bilateral shoulder flexion with use of the hula hoop and with the therapy ball.  He completed several sets of 6 repetitions with emphasis on maintaining left elbow extension.  Finished session with return to the room and call button and phone in reach.    Therapy Documentation Precautions:  Precautions Precautions: Fall Restrictions Weight Bearing Restrictions: No  Pain: Pain Assessment Pain Scale: Faces Pain Score: 0-No pain  Therapy/Group: Individual Therapy  Yasmyn Bellisario OTR/L 08/16/2018, 2:55 PM

## 2018-08-16 NOTE — Progress Notes (Signed)
Orthopedic Tech Progress Note Patient Details:  [name] 09/08/1961 [id number]  Patient ID: [name], male   DOB: 1960/12/06, 57 y.o.   MRN: [id number]   Nikki Dom 08/16/2018, 9:46 AM Called in [facility] brace order; spoke with [facility]

## 2018-08-16 NOTE — Progress Notes (Signed)
Occupational Therapy Session Note  Patient Details  Name: [name] MRN: [id number] Date of Birth: April 22, 1961  Today's Date: 08/16/2018 OT Individual Time: 0901-1002 OT Individual Time Calculation (min): 61 min    Short Term Goals: Week 1:  OT Short Term Goal 1 (Week 1): STGs equal to LTGs set at modified independent based on ELOS.  Skilled Therapeutic Interventions/Progress Updates:    Pt transferred from supine to sitting EOB.  He then worked on Academic librarian with AFO on the left foot.  Mod assist for donning the left shoe and AFO, but he was able to donn the right with setup, including tying.  He then completed toilet transfer with min guard assist using the RW for support, and stood to urinate.  Next he ambulated out to the sink for washing his hands and then down to the dayroom with use of the RW.  Noted increased left knee hyperextension with weightbearing on the left side, but ambulation overall at min guard assist.  Pt worked on use of the UE ergonometer for intervals of 4 mins, 3 mins, 3 mins, and two 1 minute sessions.  First set resistance was set on level 5 with RPMs maintained at 25-30.  All the rest of the sets were on level 1 resistance.  The final one minute sets were completed peddling reverse with the LUE with occasional min assist to maintain grip and prevent trunk and shoulder compensation.  Next had pt ambulate to the therapy gym where he sat on a mat and worked on picking up small pieces of foam from the bedside table and placing them in a styrofoam cup, without knocking it over.  Rest breaks needed with occasional knocking over of the cup.  Min demonstrational cueing to avoid shoulder hike and lean to the right.  Had him pick them up one at a time to start and progressed to picking up 2, one at a time for work on Statistician.  More difficulty noted when attempting to translate from finger tips to palm.  Pt left in gym per PT preference for next session.  Discussed  continuation of working on picking up foam pieces when he was in the room watching TV.    Therapy Documentation Precautions:  Precautions Precautions: Fall Restrictions Weight Bearing Restrictions: No   Pain: Pain Assessment Pain Scale: 0-10 Pain Score: 0-No pain ADL: See Care Plan Section for ADL details  Therapy/Group: Individual Therapy  Dayanna Pryce OTR/L 08/16/2018, 10:37 AM

## 2018-08-17 ENCOUNTER — Inpatient Hospital Stay (HOSPITAL_COMMUNITY): Payer: Self-pay | Admitting: Occupational Therapy

## 2018-08-17 ENCOUNTER — Inpatient Hospital Stay (HOSPITAL_COMMUNITY): Payer: Self-pay | Admitting: Physical Therapy

## 2018-08-17 MED ORDER — TRAMADOL HCL 50 MG PO TABS
50.0000 mg | ORAL_TABLET | Freq: Once | ORAL | Status: AC
  Administered 2018-08-17: 50 mg via ORAL
  Filled 2018-08-17: qty 1

## 2018-08-17 MED ORDER — PANTOPRAZOLE SODIUM 40 MG PO TBEC
40.0000 mg | DELAYED_RELEASE_TABLET | Freq: Every day | ORAL | Status: DC
  Administered 2018-08-17 – 2018-08-20 (×4): 40 mg via ORAL
  Filled 2018-08-17 (×4): qty 1

## 2018-08-17 MED ORDER — TRAZODONE HCL 50 MG PO TABS
50.0000 mg | ORAL_TABLET | Freq: Every day | ORAL | Status: DC
  Administered 2018-08-17 – 2018-08-19 (×3): 50 mg via ORAL
  Filled 2018-08-17 (×3): qty 1

## 2018-08-17 NOTE — Progress Notes (Signed)
Physical Therapy Session Note  Patient Details  Name: [name] MRN: [id number] Date of Birth: 02/13/1961  Today's Date: 08/17/2018 PT Individual Time:1300-1400 60 min      Short Term Goals: Week 1:  PT Short Term Goal 1 (Week 1): STG=LTG due to ELOS  Skilled Therapeutic Interventions/Progress Updates:   Pt received sitting in [facility] and agreeable to PT  Gait training with SPC through hall of rehab unit 2x 176f with supervision assist from PT. Dynamic gait training with SPC to weave through cones, over unlevlel surface and step over simulation threshold. superviison assist overall with min cues for gait pattern and safety awareness in turns.   Step training with SPC on 4 inch step min assist  From PT to prevent GR. Mini lunge to target x 10 BLE with cues to improved knee control and   Standing and supine LLE NMR. HS curls 2 x 10 , terminal knee extension 2 x 10 with level 2 tband.  SLR x 10 BLE, brige with BLE and 5 sec hold x 8, LLE bridge x 5. Min cues for awareness of the LLE and knee control throughout.   Orthotist present at end of tx for orthotics assessment. Gait without AD, poor foot clearance and knee GR consistently on the L. PLF semi ridged AFO applied with significant improvements to knee and ankle control. Supervision assist throughout.   [facility] to [facility] and [facility] and all needs met.            Therapy Documentation Precautions:  Precautions Precautions: Fall Restrictions Weight Bearing Restrictions: No Pain: 0/1 0   Therapy/Group: Individual Therapy  ALorie Phenix10/24/2019, 1:28 PM

## 2018-08-17 NOTE — Progress Notes (Signed)
Social Work Assessment and Plan  Patient Details  Name: [name] MRN: [id number] Date of Birth: 22-Sep-1961  Today's Date: 08/16/2018  Problem List:  Patient Active Problem List   Diagnosis Date Noted  . Labile blood pressure   . CKD (chronic kidney disease), stage II   . Benign prostatic hyperplasia   . Hyperlipidemia 08/14/2018  . Tobacco use disorder 08/14/2018  . Obesity 08/14/2018  . GERD (gastroesophageal reflux disease) 08/14/2018  . Leg cramp, left 08/14/2018  . Elevated blood pressure reading with diagnosis of hypertension   . Diastolic dysfunction   . Polysubstance abuse ([place])   . Right pontine cerebrovascular accident [facility]) s/p tPA 08/11/2018  . Hypertension 02/17/2014   Past Medical History:  Past Medical History:  Diagnosis Date  . Acid reflux   . Hypertension   . Kidney stone    Past Surgical History:  Past Surgical History:  Procedure Laterality Date  . CHOLECYSTECTOMY     Social History:  reports that he has been smoking cigarettes. He has been smoking about 2.00 packs per day. He has never used smokeless tobacco. He reports that he does not drink alcohol or use drugs.  Family / Support Systems Marital Status: Married How Long?: 53 years together (married 23 years) Patient Roles: Spouse, Parent, Other (Comment)(dump truck driver) Spouse/Significant Other: Glendon Dunwoody - wife - [phone] Children: 4 children Other Supports: children and neighbors to help Anticipated Caregiver: wife Ability/Limitations of Caregiver: no limitations Caregiver Availability: 24/7 Family Dynamics: close, supportive family  Social History Preferred language: English Religion: Non-Denominational Education: 9th grade Read: Yes(but not well, defers to wife for paperwork) Write: Yes(but not well, defers to wife for paperwork) Employment Status: Employed 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   Worked in a sawmill for 31 years. Legal Hisotry/Current Legal Issues: none reported Guardian/Conservator: N/A - MD has determined that pt is capable of making his own decisions.   Abuse/Neglect Abuse/Neglect Assessment Can Be Completed: Yes Physical Abuse: Denies Verbal Abuse: Denies Sexual Abuse: Denies Exploitation of patient/patient's resources: Denies Self-Neglect: Denies  Emotional Status Pt's affect, behavior and adjustment status: Pt is motivated to rehabilitate and get back to work. Recent Psychosocial Issues: none reported Psychiatric History: none reported Substance Abuse History: Pt was using marijuana, cocaine, alcohol  Patient / Family Perceptions, Expectations & Goals Pt/Family understanding of illness & functional limitations: Pt/wife has a good understanding of pt's condition and limitations.  They have no unanswered questions. Premorbid pt/family roles/activities: Pt works long hours, sits outside when he's not at work, "tinkers" in his shop fixing things.  Pt enjoys his dogs and 15 cats. Anticipated changes in roles/activities/participation: Pt would like to resume activities as he is able. Pt/family expectations/goals: Pt wants to be able to use his left arm and leg and be able to leave CIR soon, due to having no insurance.  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 Patient/Family Preliminary Plans: Pt plans to return to his home  with his wife. Social Work Anticipated Follow Up Needs: HH/OP, Support Group Expected length of stay: 5 to 10 days  Clinical Impression CSW met with pt and his wife to introduce self and role of CSW, as well as to complete assessment.  Pt and wife are grateful pt is on CIR, but they are concerned that pt does not have insurance and will have to pay for this bill.  Financial Counselor has reviewed pt's case and it does not seem he will be eligible for Medicaid.  CSW gave pt information on [place], and social security disability application (per pt's request).  CSW also made referral to Outpt Rehab at [facility] and ordered pt a single point cane and tub transfer bench through [facility].  No other concerns/questions/needs stated.  CSW will continue to follow and assist as needed.  Prevatt, Silvestre Mesi 08/17/2018, 4:08 PM

## 2018-08-17 NOTE — Progress Notes (Signed)
Orthopedic Tech Progress Note Patient Details:  [name] Oct 28, 1960 [id number]  Patient ID: [name], male   DOB: 25-May-1961, 57 y.o.   MRN: [id number]   [name] 08/17/2018, 10:34 AMCalled [facility] for left AFO brace.

## 2018-08-17 NOTE — Progress Notes (Signed)
[facility] PHYSICAL MEDICINE & REHABILITATION PROGRESS NOTE  Subjective/Complaints: Patient seen laying in bed this morning.  He states he slept well overnight.  He states he was told not to wear his PR AFO, encouraged to use.  ROS: Denies CP, SOB, nausea, vomiting, diarrhea.  Objective: Vital Signs: Blood pressure 137/81, pulse 83, temperature 98.4 F (36.9 C), temperature source Oral, resp. rate 18, height 5\' 7"  (1.702 m), weight 90.3 kg, SpO2 96 %. No results found. Recent Labs    08/15/18 0457  WBC 6.6  HGB 13.4  HCT 40.1  PLT 211   Recent Labs    08/15/18 0457  NA 136  K 3.8  CL 108  CO2 20*  GLUCOSE 108*  BUN 26*  CREATININE 1.05  CALCIUM 8.8*    Physical Exam: BP 137/81 (BP Location: Right Arm)   Pulse 83   Temp 98.4 F (36.9 C) (Oral)   Resp 18   Ht 5\' 7"  (1.702 m)   Wt 90.3 kg   SpO2 96%   BMI 31.18 kg/m  Constitutional: NAD.  Vital signs reviewed. HENT: Normocephalic and atraumatic.  Eyes: EOMI.  No discharge. Cardiovascular: RRR.  No JVD. Respiratory: Effort normal.  Clear. GI: Bowel sounds are normal. He exhibits no distension.  Musculoskeletal: No edema or tenderness in extremities  Neurological: He is alert and oriented.  Follows commands.   Fair awareness of deficits Motor: RUE/RLE: 5/5 proximal to distal LUE: 4/5 proximally, 3+-4-/5 wrist extension and handgrip LLE: 4/5 proximally,   2-2+/5 ankle dorsiflexion Skin: Skin is warm and dry.  Psychiatric: He has a normal mood and affect. His behavior is normal.   Assessment/Plan: 1. Functional deficits secondary to right pontine infarct status post TPA which require 3+ hours per day of interdisciplinary therapy in a comprehensive inpatient rehab setting.  Physiatrist is providing close team supervision and 24 hour management of active medical problems listed below.  Physiatrist and rehab team continue to assess barriers to discharge/monitor patient progress toward functional and medical  goals  Care Tool:  Bathing    Body parts bathed by patient: Right arm, Left arm, Chest, Abdomen, Front perineal area, Buttocks, Right upper leg, Left upper leg, Right lower leg, Left lower leg, Face         Bathing assist Assist Level: Minimal Assistance - Patient > 75%     Upper Body Dressing/Undressing Upper body dressing   What is the patient wearing?: Pull over shirt    Upper body assist Assist Level: Supervision/Verbal cueing    Lower Body Dressing/Undressing Lower body dressing    Lower body dressing activity did not occur: N/A What is the patient wearing?: Pants     Lower body assist Assist for lower body dressing: Minimal Assistance - Patient > 75%     Toileting Toileting Toileting Activity did not occur (Clothing management and hygiene only): N/A (no void or bm)  Toileting assist Assist for toileting: Supervision/Verbal cueing     Transfers Chair/bed transfer  Transfers assist  Chair/bed transfer activity did not occur: N/A  Chair/bed transfer assist level: Supervision/Verbal cueing     Locomotion Ambulation   Ambulation assist      Assist level: Supervision/Verbal cueing Assistive device: Cane-straight Max distance: 368ft   Walk 10 feet activity   Assist     Assist level: Supervision/Verbal cueing     Walk 50 feet activity   Assist    Assist level: Supervision/Verbal cueing      Walk 150 feet activity  Assist    Assist level: Supervision/Verbal cueing Assistive device: Walker-rolling    Walk 10 feet on uneven surface  activity   Assist     Assist level: Supervision/Verbal cueing     Wheelchair     Assist Will patient use wheelchair at discharge?: No             Wheelchair 50 feet with 2 turns activity    Assist            Wheelchair 150 feet activity     Assist           Medical Problem List and Plan: 1.  Left-sided weakness secondary to right pontine infarction status post  TPA.  Aspirin and Plavix x3 weeks then aspirin alone  Continue CIR  WHO/PRAFO ordered 2.  DVT Prophylaxis/Anticoagulation: Subcutaneous Lovenox.  Monitor for any bleeding episodes 3. Pain Management: Neurontin 300 mg every 8 hours as needed 4. Mood: Provide emotional support 5. Neuropsych: This patient is capable of making decisions on his own behalf. 6. Skin/Wound Care: Routine skin checks 7. Fluids/Electrolytes/Nutrition: Routine in and outs 8.  Hypertension.  Norvasc 5 mg daily, Lotensin 10 mg daily.  Monitor with increased mobility  Labile on 10/24 9.  Hyperlipidemia.  Lipitor 10.  Tobacco as well as polysubstance abuse.  Urine drug screen positive cocaine and marijuana.  Continue NicoDerm patch.  Provide counseling 11.  BPH.  Flomax 0.4 mg nightly.    PVR reviewed, unremarkable 12.  GERD.  Prilosec  13.  CKD stage II  Creatinine 1.05 on 10/22  Continue to monitor  LOS: 3 days A FACE TO FACE EVALUATION WAS PERFORMED  Altamese Deguire Karis Juba 08/17/2018, 8:30 AM

## 2018-08-17 NOTE — Progress Notes (Signed)
Occupational Therapy Session Note  Patient Details  Name: [name] MRN: [id number] Date of Birth: 1960-11-14  Today's Date: 08/17/2018 OT Individual Time: 4098-1191 OT Individual Time Calculation (min): 30 min    Short Term Goals: Week 1:  OT Short Term Goal 1 (Week 1): STGs equal to LTGs set at modified independent based on ELOS.  Skilled Therapeutic Interventions/Progress Updates:    1:! Focus on functional use of left hand especially wrist control. Pt able to perform large peg task with extra time with focus on pincher grasp and in hand manipulation. Grasp and manipulation of different type glass ( coffee cup v glass) then progressing to pouring a pitcher of water with left hand into cups again focusing on scapular stabilization and distal control. Performed paddle maze with minimal difficulty with extra time. Performed bilateral game of batmitten and carrying racket with ball on in with goal of balancing ball while walking. Pt required extra attention for sustained wrist radial deviation while carrying item. Return to room with [facility] with contact guard with focus on attention to left knee and walking slower to promote increased knee control (decr hyperextension).   Therapy Documentation Precautions:  Precautions Precautions: Fall Restrictions Weight Bearing Restrictions: No Pain:  ice applied to forearm afterwards for mm discomfort from working Ue yesterday and today  Therapy/Group: Individual Therapy  Roney Mans [facility] 08/17/2018, 3:15 PM

## 2018-08-17 NOTE — Progress Notes (Signed)
Occupational Therapy Session Note  Patient Details  Name: [name] MRN: [id number] Date of Birth: 1961-07-06  Today's Date: 08/17/2018 OT Individual Time: 4098-1191 OT Individual Time Calculation (min): 57 min    Short Term Goals: Week 1:  OT Short Term Goal 1 (Week 1): STGs equal to LTGs set at modified independent based on ELOS.  Skilled Therapeutic Interventions/Progress Updates:    Pt in bed to start session reporting pain last night and this am in the left arm, mostly triceps region.  He was dressed and ready to go for session so had him ambulate down to the therapy gym with supervision and use of the single point cane.  He then transitioned to supine where heat pack was placed on the left arm above and below the elbow for 10 mins.  Pt reporting that the arm felt much better after this treatment.  Therapist applied NMES to the left forearm to assist with activation of the digit extensors.  He was able to tolerate active stimulation for 25 min with settings at 10 secs on/off, intensity at 27 with PPS at 35 and pulse width at 300.  Had pt work on functional  reaching toward cup while stimulation was active for the final 10 mins.  Returned to room at end of session with spouse present.  He was left sitting EOB in anticipation of next therapy.   Therapy Documentation Precautions:  Precautions Precautions: Fall Restrictions Weight Bearing Restrictions: No  Pain: Pain Assessment Pain Scale: 0-10 Pain Score: 0-No pain  Therapy/Group: Individual Therapy  Shrihan Putt OTR/L 08/17/2018, 9:46 AM

## 2018-08-17 NOTE — Progress Notes (Signed)
Occupational Therapy Session Note  Patient Details  Name: [name] MRN: [id number] Date of Birth: [birth year]  Today's Date: 08/17/2018 OT Individual Time: 1416-1500 OT Individual Time Calculation (min): 44 min    Short Term Goals: Week 1:  OT Short Term Goal 1 (Week 1): STGs equal to LTGs set at modified independent based on ELOS.  Skilled Therapeutic Interventions/Progress Updates:    Pt completed functional mobility down to the therapy gym with supervision and use of a single point cane and AFO on the left foot to start session.  He then sat on a therapy mat for work on the LUE coordination and neuro re-education.  He worked on picking up checkers individually with the LUE and placing in grid.  He was successful 90% of the time with checkers placed beside him on the mat and therapist having him reach down just above knee level in front of him to place them, as to avoid shoulder hiking compensation.  Therapist then had him pick up 2 checkers, 1 at a time to work on Statistician.  He demonstrated moderate difficulty trying to manipulate them from his palm to fingertips, and at times used his thigh to help reposition them.  Progressed to working on PVC pipe puzzle and having pt reach to chest level or higher to pick up and place pieces with the LUE.  He used the RUE WFL at times as well to assist with pushing pieces together and pulling them apart.  Finished session with functional mobility back to the room with min guard assist and no assistive device besides the LAFO.  Pt left in bedside chair with call button and phone in reach and spouse present.    Therapy Documentation Precautions:  Precautions Precautions: Fall Restrictions Weight Bearing Restrictions: No  Pain: Pain Assessment Pain Scale: 0-10 Pain Score: 3  Faces Pain Scale: Hurts a little bit Pain Type: Acute pain Pain Location: Arm Pain Orientation: Left Pain Descriptors / Indicators: Aching Pain Onset: With  Activity Pain Intervention(s): Repositioned;Heat applied   Therapy/Group: Individual Therapy  Grethel Zenk OTR/L 08/17/2018, 4:12 PM

## 2018-08-18 ENCOUNTER — Inpatient Hospital Stay (HOSPITAL_COMMUNITY): Payer: Self-pay | Admitting: Physical Therapy

## 2018-08-18 ENCOUNTER — Inpatient Hospital Stay (HOSPITAL_COMMUNITY): Payer: Self-pay | Admitting: Occupational Therapy

## 2018-08-18 MED ORDER — OMEPRAZOLE MAGNESIUM 20 MG PO TBEC: 20.0000 mg | DELAYED_RELEASE_TABLET | Freq: Every day | ORAL | 1 refills | Status: DC

## 2018-08-18 MED ORDER — GABAPENTIN 300 MG PO CAPS: 300.0000 mg | ORAL_CAPSULE | Freq: Three times a day (TID) | ORAL | 0 refills | Status: DC | PRN

## 2018-08-18 MED ORDER — ACETAMINOPHEN 325 MG PO TABS: 650.0000 mg | ORAL_TABLET | ORAL | Status: DC | PRN

## 2018-08-18 MED ORDER — AMLODIPINE BESY-BENAZEPRIL HCL 5-10 MG PO CAPS: 1.0000 | ORAL_CAPSULE | Freq: Every day | ORAL | 1 refills | Status: DC

## 2018-08-18 MED ORDER — NICOTINE 21 MG/24HR TD PT24: MEDICATED_PATCH | TRANSDERMAL | 0 refills | Status: DC

## 2018-08-18 MED ORDER — MELATONIN 3 MG PO TABS: 3.0000 mg | ORAL_TABLET | Freq: Every evening | ORAL | 0 refills | Status: DC | PRN

## 2018-08-18 MED ORDER — ATORVASTATIN CALCIUM 40 MG PO TABS: 40.0000 mg | ORAL_TABLET | Freq: Every day | ORAL | 1 refills | Status: DC

## 2018-08-18 MED ORDER — CLOPIDOGREL BISULFATE 75 MG PO TABS: 75.0000 mg | ORAL_TABLET | Freq: Every day | ORAL | 1 refills | Status: DC

## 2018-08-18 MED ORDER — TAMSULOSIN HCL 0.4 MG PO CAPS: 0.4000 mg | ORAL_CAPSULE | Freq: Every day | ORAL | 0 refills | Status: DC

## 2018-08-18 NOTE — Progress Notes (Signed)
Social Work Discharge Note  The overall goal for the admission was met for:   Discharge location: Yes - home with his wife  Length of Stay: Yes - 6 days  Discharge activity level: Yes - supervision  Home/community participation: Yes  Services provided included: MD, RD, PT, OT, RN, Pharmacy, Neuropsych and SW  Financial Services: Other: Pt is self pay - [place] information given to him and Financial Counselor has screened pt.  He will likely not be eligible for Medicaid.  Follow-up services arranged: Outpatient: PT/OT, DME: single point cane & tub transfer bench and Patient/Family request agency HH: [facility], DME: [facility]  Comments (or additional information): wife has been at pt's bedside since admission and is prepared to care for pt at home at supervision level.  Patient/Family verbalized understanding of follow-up arrangements: Yes  Individual responsible for coordination of the follow-up plan: pt and his wife  Confirmed correct DME delivered: Trey Sailors 08/18/2018    Knute Mazzuca, Silvestre Mesi

## 2018-08-18 NOTE — Discharge Summary (Signed)
[name], [name] MEDICAL RECORD ZO:[id number] ACCOUNT [id number] DATE OF BIRTH:1960/12/27 FACILITY: [facility] LOC[facility]: [facility] PHYSICIAN:Barry PATEL, MD  DISCHARGE SUMMARY  DATE OF DISCHARGE:  08/18/2018  DISCHARGE DIAGNOSES:   1.  Right pontine infarction, status post tPA.  Subcutaneous Lovenox for deep venous thrombosis prophylaxis. 2.  Pain management. 3.  Hypertension. 4.  Hyperlipidemia.   5.  Tobacco abuse as well as polysubstance abuse. 6.  Benign prostatic hypertrophy 7.  Gastroesophageal reflux disease.  8.  Chronic kidney disease stage II.  HISTORY OF PRESENT ILLNESS:  This is a 57 year old right-handed male with history of hypertension, tobacco abuse who lives with his wife.  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  Presented 08/11/2018 with left-sided weakness of acute  onset.  Cranial CT scan unremarkable.  The patient did receive tPA.  Urine drug screen positive for cocaine and marijuana.  CT angiogram of head and neck showed no large vessel occlusion or high-grade stenosis or aneurysm.  MRI showed acute pontine  infarction.  Echocardiogram with ejection fraction 65%, grade I diastolic dysfunction.  No PFO identified.  Neurology consulted.  Maintained on aspirin and Plavix x3 weeks, then aspirin alone.  Subcutaneous Lovenox for DVT prophylaxis.  Therapy  evaluations completed and the patient was admitted for comprehensive rehabilitation program.  PAST MEDICAL HISTORY:  See discharge diagnoses.  SOCIAL HISTORY:  Lives with spouse, independent prior to admission.  FUNCTIONAL STATUS:  Upon admission to rehab services was moderate assist 22 feet, 1 person handheld assist, minimal assist sit to stand, min mod assist with activities of daily living.  PHYSICAL EXAMINATION: VITAL SIGNS:  Blood pressure 150/94, pulse 96, temperature 98, respirations 18. GENERAL:  Alert male in no acute distress. HEENT:  EOMs intact. NECK:  Supple, nontender, no  JVD. CARDIOVASCULAR:  Rate controlled. ABDOMEN:  Soft, nontender, good bowel sounds. LUNGS:  Clear to auscultation without wheeze.  REHABILITATION HOSPITAL COURSE:  The patient was admitted to inpatient rehabilitation services.  Therapies initiated on a 3-hour daily basis, consisting of physical therapy, occupational therapy and rehabilitation nursing.  The following issues were  addressed during patient's rehabilitation stay.  Pertaining to the patient's  right pontine infarction, remained stable.  Maintained on aspirin and Plavix therapy x3 weeks, then aspirin alone.  He would follow up neurology services.  Subcutaneous Lovenox  for DVT prophylaxis.  No bleeding episodes.    Pain management with the use of Neurontin as needed.    Blood pressure is controlled on Norvasc, Lotensin.  He would follow up with his primary MD.    Lipitor ongoing for hyperlipidemia.    Noted history of tobacco abuse as well as positive urine drug screen of cocaine and marijuana.  The patient was maintained on a Nicoderm patch as well as receiving full counseling in regard to cessation of nicotine, alcohol, and any illicit drug  products.    He did have a history of BPH, maintained on Flomax, voiding without difficulty.    CKD stage II, creatinine stable at 1.05.    The patient received weekly collaborative interdisciplinary team conferences to discuss estimated length of stay, family teaching, any barriers to his discharge.  He was ambulating 150 feet, AFO brace, rolling walker.  Significant improvement in foot  clearance.  Stair management supervision.  He can gather belongings for activities of daily living and homemaking.  He was able to communicate his needs.  Full family teaching was completed and planned discharge to home.  DISCHARGE MEDICATIONS:  Included Norvasc  5 mg p.o. daily, aspirin 81 mg p.o. daily, Lipitor 40 mg p.o. daily, Lotensin 10 mg p.o. daily, Plavix 75 mg p.o. daily, Nicoderm patch, taper as  directed, Protonix 40 mg p.o. daily, Flomax 0.4 mg at bedtime,  Neurontin 300 mg every 8 hours as needed.  DIET:  Regular.  He would follow up with Dr. Maryla Ritter at the outpatient rehab service office as directed; Dr. Roda Ritter of [facility] Neurology Service call for appointment; Dr. Olena Ritter, medical management.  SPECIAL INSTRUCTIONS:  No alcohol, no smoking, no illicit drug use.  Aspirin and Plavix therapy x3 weeks, then aspirin alone.  AN/NUANCE D:08/18/2018 T:08/18/2018 JOB:[id number]

## 2018-08-18 NOTE — Discharge Instructions (Signed)
Inpatient Rehab Discharge Instructions  [name] Discharge date and time: No discharge date for patient encounter.   Activities/Precautions/ Functional Status: Activity: activity as tolerated Diet: regular diet Wound Care: none needed Functional status:  ___ No restrictions     ___ Walk up steps independently ___ 24/7 supervision/assistance   ___ Walk up steps with assistance ___ Intermittent supervision/assistance  ___ Bathe/dress independently ___ Walk with walker     ___ Bathe/dress with assistance ___ Walk Independently    ___ Shower independently ___ Walk with assistance    ___ Shower with assistance ___ No alcohol     ___ Return to work/school ________  COMMUNITY REFERRALS UPON DISCHARGE:   Outpatient: PT     OT  Agency:  [facility]                            [address]                            [place], [place]  [address]  Phone:  [phone]  Appointment Date/Time: They will call you early next week to schedule your appointments. Medical Equipment/Items Ordered:  Single point cane  Agency/Supplier:  [facility]        Phone:  [phone]  GENERAL COMMUNITY RESOURCES FOR PATIENT/FAMILY: Support Groups:   Brain Injury and Stroke Support Group (for survivors, family members and caregivers)                              Meets the third Mondays of the month from 1:30-2:30 PM                              [facility]                               [address]                              [place], [place]                              For more information, call Becky Sax at [phone] or Clarene Essex at [phone]                              [facility] Stroke Support Group                             Meets every second Thursday of each month from 6-7 PM (except June, July, August)                              In the dayroom of [facility] [phone])                             For more information, call Amy Arnette at [phone] or [email or web address]  MATCH Program - use the card Dan/Jenny gave you to that make the prescriptions $3/medication.                                [facility] - complete application Boneta Lucks gave you and take it to the clinic to establish care.                              Department of Social Services - emergency services, food stamps, etc.                              [place] Financial Assistance Program - apply with application Boneta Lucks gave to you.  Special Instructions: No tobacco, alcohol, or illicit drug use  Aspirin and Plavix therapy x3 weeks total then aspirin alone   My questions have been answered and I understand these instructions. I will adhere to these goals and the provided educational materials after my discharge from the hospital.  Patient/Caregiver Signature _______________________________ Date __________  Clinician Signature _______________________________________ Date __________  Please bring this form and your medication list with you to all your follow-up doctor's appointments. Inpatient Rehab Discharge Instructions  [name] Discharge date and time: No discharge date for patient encounter.   Activities/Precautions/ Functional Status: Activity: As tolerated Diet: regular diet Wound Care: none needed Functional status:  ___ No restrictions     ___ Walk up steps independently ___ 24/7 supervision/assistance   ___ Walk up steps with assistance ___ Intermittent supervision/assistance  ___ Bathe/dress independently ___ Walk with walker     _x__ Bathe/dress with assistance ___ Walk Independently    ___ Shower independently ___ Walk with assistance    ___ Shower with assistance ___ No alcohol     ___ Return to work/school ________  Special Instructions: No driving smoking or alcohol Continue  aspirin and Plavix x3 weeks total and then aspirin alone STROKE/TIA DISCHARGE INSTRUCTIONS SMOKING Cigarette smoking nearly doubles your risk of having a stroke & is the single most alterable risk factor  If you smoke or have smoked in the last 12 months, you are advised to quit smoking for your health.  Most of the excess cardiovascular risk related to smoking disappears within a year of stopping.  Ask you doctor about anti-smoking medications  [place] Quit Line: 1-800-QUIT NOW  Free Smoking Cessation Classes [phone]  CHOLESTEROL Know your levels; limit fat & cholesterol in your diet  Lipid Panel     Component Value Date/Time   CHOL 225 (H) 08/11/2018 0831   CHOL 244 (H) 02/15/2014 1630   TRIG 200 (H) 08/11/2018 0831   HDL 33 (L) 08/11/2018 0831   HDL 30 (L) 02/15/2014 1630   CHOLHDL 6.8 08/11/2018 0831   VLDL 40 08/11/2018 0831   LDLCALC 152 (H) 08/11/2018 0831   LDLCALC 168 (H) 02/15/2014 1630      Many patients benefit from treatment even if their cholesterol is at goal.  Goal: Total Cholesterol (CHOL) less than 160  Goal:  Triglycerides (TRIG) less than 150  Goal:  HDL greater than 40  Goal:  LDL (LDLCALC) less than 100   BLOOD PRESSURE American Stroke Association blood pressure target is less that 120/80 mm/Hg  Your discharge blood pressure is:  BP: 136/79  Monitor your blood pressure  Limit your salt and  alcohol intake  Many individuals will require more than one medication for high blood pressure  DIABETES (A1c is a blood sugar average for last 3 months) Goal HGBA1c is under 7% (HBGA1c is blood sugar average for last 3 months)  Diabetes: No known diagnosis of diabetes    Lab Results  Component Value Date   HGBA1C 5.6 08/11/2018     Your HGBA1c can be lowered with medications, healthy diet, and exercise.  Check your blood sugar as directed by your physician  Call your physician if you experience unexplained or low blood sugars.  PHYSICAL  ACTIVITY/REHABILITATION Goal is 30 minutes at least 4 days per week  Activity: Increase activity slowly, Therapies: Physical Therapy: Home Health Return to work:   Activity decreases your risk of heart attack and stroke and makes your heart stronger.  It helps control your weight and blood pressure; helps you relax and can improve your mood.  Participate in a regular exercise program.  Talk with your doctor about the best form of exercise for you (dancing, walking, swimming, cycling).  DIET/WEIGHT Goal is to maintain a healthy weight  Your discharge diet is:  Diet Order            Diet Heart Room service appropriate? Yes; Fluid consistency: Thin  Diet effective now              liquids Your height is:  Height: 5\' 7"  (170.2 cm) Your current weight is: Weight: 90.3 kg Your Body Mass Index (BMI) is:  BMI (Calculated): 31.17  Following the type of diet specifically designed for you will help prevent another stroke.  Your goal weight range is:    Your goal Body Mass Index (BMI) is 19-24.  Healthy food habits can help reduce 3 risk factors for stroke:  High cholesterol, hypertension, and excess weight.  RESOURCES Stroke/Support Group:  Call [phone]   STROKE EDUCATION PROVIDED/REVIEWED AND GIVEN TO PATIENT Stroke warning signs and symptoms How to activate emergency medical system (call 911). Medications prescribed at discharge. Need for follow-up after discharge. Personal risk factors for stroke. Pneumonia vaccine given:  Flu vaccine given:  My questions have been answered, the writing is legible, and I understand these instructions.  I will adhere to these goals & educational materials that have been provided to me after my discharge from the hospital.      My questions have been answered and I understand these instructions. I will adhere to these goals and the provided educational materials after my discharge from the hospital.  Patient/Caregiver Signature  _______________________________ Date __________  Clinician Signature _______________________________________ Date __________  Please bring this form and your medication list with you to all your follow-up doctor's appointments.

## 2018-08-18 NOTE — Progress Notes (Signed)
Physical Therapy Session Note  Patient Details  Name: KEYON LILLER MRN: [id number] Date of Birth: [birth year]  Today's Date: 08/18/2018 PT Individual Time: 1005-1102 AND 1300-1330 PT Individual Time Calculation (min): 57 min AND 30 min   Short Term Goals: Week 1:  PT Short Term Goal 1 (Week 1): STG=LTG due to ELOS  Skilled Therapeutic Interventions/Progress Updates:   Pt received supine in bed and agreeable to PT. Supine>sit transfer without assist or cues from PT.   Gait training instructed by PT with supervision assist and SPC 162f+ [id number] [id number]. Min cues for improved attention to L knee control to prevent GR intermittently throughout gait training.   Modified [place] level A and B with hand out provided. Education for use of UE with all dynamic balance exercises as well as proper technique to reduce compensatory movements   Dynamic balance training instructed by PT to perform fine motor reaching task while standnig on blue wedge. Min assist for improved use of ankle strategy to perform weight shifting.   Patient returned to room and [facility] reach and all needs met.     Session 2.   Pt received supine in bed and agreeable to PT. Supine>sit transfer without  assist or cues   Gait training instructed by PT and SPT [id number] SPC, LAFO, and distant supervision assist overall through various environments including hall of hospital, gift shop, food court, cement sidewalk. One instance of knee GR with attempting to open large door leading to side walk. Pt able to correct without additional assist from PT.   Dynamicn balance training:  Ball toss on level surface and unlevel surface with supervision assist from SPT, 4 x ~1 min each progressing to from inside BOS to outside BOS. PT also performed ball toss with sit<>stand from level surface progressing to unlevel surface of ariex pad 2 x 1 min each. Min cues from SPT for improved eccentric control with  sit<>stand.   Patient returned to room and left sitting  EOB with call bell in reach and all needs met.         Therapy Documentation Precautions:  Precautions Precautions: Fall Restrictions Weight Bearing Restrictions: No Pain: Pain Assessment Pain Scale: 0-10 Pain Score: 4  Pain Type: Acute pain Pain Location: Arm Pain Orientation: Left Pain Intervention(s): Medication (See eMAR)    Therapy/Group: Individual Therapy  AustLorie Phenix25/2019, 11:07 AM

## 2018-08-18 NOTE — Progress Notes (Signed)
[place] PHYSICAL MEDICINE & REHABILITATION PROGRESS NOTE  Subjective/Complaints: Patient seen sitting up at the edge of his bed this morning eating breakfast.  He states he slept well overnight.  He states he feels that he is progressing.  ROS: Denies CP, SOB, nausea, vomiting, diarrhea.  Objective: Vital Signs: Blood pressure 130/87, pulse 81, temperature 98.1 F (36.7 C), temperature source Oral, resp. rate 19, height 5\' 7"  (1.702 m), weight 90.3 kg, SpO2 96 %. No results found. No results for input(s): WBC, HGB, HCT, PLT in the last 72 hours. No results for input(s): NA, K, CL, CO2, GLUCOSE, BUN, CREATININE, CALCIUM in the last 72 hours.  Physical Exam: BP 130/87   Pulse 81   Temp 98.1 F (36.7 C) (Oral)   Resp 19   Ht 5\' 7"  (1.702 m)   Wt 90.3 kg   SpO2 96%   BMI 31.18 kg/m  Constitutional: NAD.  Vital signs reviewed. HENT: Normocephalic and atraumatic.  Eyes: EOMI.  No discharge. Cardiovascular: RRR.  No JVD. Respiratory: Effort normal.  Clear. GI: Bowel sounds are normal. He exhibits no distension.  Musculoskeletal: No edema or tenderness in extremities  Neurological: He is alert and oriented.  Follows commands.   Fair awareness of deficits Motor: RUE/RLE: 5/5 proximal to distal LUE: 4/5 proximally, 3+-4-/5 wrist extension and handgrip, stable LLE: 4/5 proximally,   2-2+/5 ankle dorsiflexion, stable Skin: Skin is warm and dry.  Psychiatric: He has a normal mood and affect. His behavior is normal.   Assessment/Plan: 1. Functional deficits secondary to right pontine infarct status post TPA which require 3+ hours per day of interdisciplinary therapy in a comprehensive inpatient rehab setting.  Physiatrist is providing close team supervision and 24 hour management of active medical problems listed below.  Physiatrist and rehab team continue to assess barriers to discharge/monitor patient progress toward functional and medical goals  Care Tool:  Bathing     Body parts bathed by patient: Right arm, Left arm, Chest, Abdomen, Front perineal area, Buttocks, Right upper leg, Left upper leg, Right lower leg, Left lower leg, Face         Bathing assist Assist Level: Minimal Assistance - Patient > 75%     Upper Body Dressing/Undressing Upper body dressing   What is the patient wearing?: Pull over shirt    Upper body assist Assist Level: Supervision/Verbal cueing    Lower Body Dressing/Undressing Lower body dressing    Lower body dressing activity did not occur: N/A What is the patient wearing?: Pants     Lower body assist Assist for lower body dressing: Minimal Assistance - Patient > 75%     Toileting Toileting Toileting Activity did not occur (Clothing management and hygiene only): N/A (no void or bm)  Toileting assist Assist for toileting: Supervision/Verbal cueing     Transfers Chair/bed transfer  Transfers assist  Chair/bed transfer activity did not occur: N/A  Chair/bed transfer assist level: Supervision/Verbal cueing     Locomotion Ambulation   Ambulation assist      Assist level: Supervision/Verbal cueing Assistive device: Cane-straight Max distance: 167ft   Walk 10 feet activity   Assist     Assist level: Supervision/Verbal cueing Assistive device: Cane-straight   Walk 50 feet activity   Assist    Assist level: Supervision/Verbal cueing      Walk 150 feet activity   Assist    Assist level: Supervision/Verbal cueing Assistive device: Cane-straight    Walk 10 feet on uneven surface  activity  Assist     Assist level: Supervision/Verbal cueing     Wheelchair     Assist Will patient use wheelchair at discharge?: No             Wheelchair 50 feet with 2 turns activity    Assist            Wheelchair 150 feet activity     Assist           Medical Problem List and Plan: 1.  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   Aspirin and Plavix x3 weeks then aspirin alone  Continue CIR  Plan for d/c Sunday  Will see patient for transitional care management in 1-2 weeks post-discharge  PRAFO 2.  DVT Prophylaxis/Anticoagulation: Subcutaneous Lovenox.  Monitor for any bleeding episodes 3. Pain Management: Neurontin 300 mg every 8 hours as needed 4. Mood: Provide emotional support 5. Neuropsych: This patient is capable of making decisions on his own behalf. 6. Skin/Wound Care: Routine skin checks 7. Fluids/Electrolytes/Nutrition: Routine in and outs 8.  Hypertension.  Norvasc 5 mg daily, Lotensin 10 mg daily.  Monitor with increased mobility  Labile on 10/25 9.  Hyperlipidemia.  Lipitor 10.  Tobacco as well as polysubstance abuse.  Urine drug screen positive cocaine and marijuana.  Continue NicoDerm patch.  Provide counseling 11.  BPH.  Flomax 0.4 mg nightly.    PVR reviewed, unremarkable 12.  GERD.  Prilosec  13.  CKD stage II  Creatinine 1.05 on 10/22  Continue to monitor  LOS: 4 days A FACE TO FACE EVALUATION WAS PERFORMED  Firmin Belisle Karis Juba 08/18/2018, 8:26 AM

## 2018-08-18 NOTE — Progress Notes (Signed)
Occupational Therapy Session Note  Patient Details  Name: [name] MRN: [id number] Date of Birth: Feb 22, 1961  Today's Date: 08/18/2018 OT Individual Time: 0804-0900 OT Individual Time Calculation (min): 56 min    Short Term Goals: Week 1:  OT Short Term Goal 1 (Week 1): STGs equal to LTGs set at modified independent based on ELOS.  Skilled Therapeutic Interventions/Progress Updates:    Pt ambulated from the his room to the day room with supervision and use of the single point cane.  Once in the dayroom he worked on Medical sales representative with use of the UE ergonometer.  Pt completed 3 intervals with BUE use.  First interval for 2 mins, 2nd for 3 mins, and the 3rd for 5 mins.  Resistance was set on level 5 and RPMs maintained at 25-32 throughout each set.  Once completed, pt transitioned from the ergonometer to the high/low table.  There he worked on [employer] with the LUE.  He was able to complete 3 to 4 rows of blocks with 3 on each row at supervision level with increased time and increased rest breaks.  Pt reporting greater pain in his right hand today compared to previous day as well.  Finished session with functional mobility back to the room and pt transitioning to bed.  There he was educated on triceps exercise and wrist exercise with the LUE to be completed at home.  Pt is able to flex his left shoulder and maintain at around 90 degrees while completing AROM trriceps extension for 1 set of 12 reps.  He also completed 1 set of 15 reps for wrist extension as well.  Provided handout for reference at home.    Therapy Documentation Precautions:  Precautions Precautions: Fall Restrictions Weight Bearing Restrictions: No   Pain: Pain Assessment Pain Scale: 0-10 Faces Pain Scale: Hurts a little bit Pain Type: Acute pain Pain Location: Hand Pain Orientation: Left Pain Descriptors / Indicators: Discomfort Pain Onset: With Activity Pain Intervention(s):  Repositioned;Emotional support  Therapy/Group: Individual Therapy  Azarel Banner OTR/L  08/18/2018, 12:13 PM

## 2018-08-18 NOTE — Plan of Care (Signed)
  Problem: RH SAFETY Goal: RH STG ADHERE TO SAFETY PRECAUTIONS W/ASSISTANCE/DEVICE Description STG Adhere to Safety Precautions With Assistance/Device. Mod I  Outcome: Progressing  Call light within reach, bed/chair alarm, proper footwear  

## 2018-08-18 NOTE — Discharge Summary (Signed)
Discharge summary job [phone]

## 2018-08-18 NOTE — Progress Notes (Signed)
Occupational Therapy Session Note  Patient Details  Name: [name] MRN: [id number] Date of Birth: 05/27/1961  Today's Date: 08/18/2018 OT Individual Time: 4098-1191 OT Individual Time Calculation (min): 42 min    Short Term Goals: Week 1:  OT Short Term Goal 1 (Week 1): STGs equal to LTGs set at modified independent based on ELOS.  Skilled Therapeutic Interventions/Progress Updates:    Pt completed wall slides in sitting and standing sit to stand with the LUE to start session.  Min assist needed for pt to slide the left hand up over head and avoid internal rotation of the elbow as well as keeping elbow extension.  He completed 3 sets of 10 repetitions for this before ambulating down to the therapy gym with supervision and use of the single point cane.  Once in the gym had pt transition onto the mat in quadriped for work on LUE weightbearing.  Had him reach up to the wall with his right hand and hold it while keeping his balance on his knees and the left hand.  Several repetitions completed with min instructional cueing to keep the left elbow extended.  Next had pt transition to sitting on the EOM.  Therapist applied NMES to the left digit extensors for 15 mins.  While stimulation was active had pt reach to table top for simulated opening hand to grasp cup.  Intensity set on level 27 with pulse width 300 and pps at 35.  On and off time were 10 seconds each.  Pt exhibited no adverse reactions to stimuli.  Finished session with return to the room and pt transferring into bed.  Pt's spouse present throughout session.    Therapy Documentation Precautions:  Precautions Precautions: Fall Restrictions Weight Bearing Restrictions: No  Pain: Pain Assessment Pain Scale: 0-10 Pain Score: 0-No pain Faces Pain Scale: Hurts a little bit Pain Type: Acute pain Pain Location: Hand Pain Orientation: Left Pain Descriptors / Indicators: Discomfort Pain Onset: With Activity Pain Intervention(s):  Repositioned;Emotional support  Therapy/Group: Individual Therapy  Noga Fogg OTR/L 08/18/2018, 3:43 PM

## 2018-08-19 ENCOUNTER — Inpatient Hospital Stay (HOSPITAL_COMMUNITY): Payer: Self-pay | Admitting: Occupational Therapy

## 2018-08-19 ENCOUNTER — Inpatient Hospital Stay (HOSPITAL_COMMUNITY): Payer: Self-pay | Admitting: Physical Therapy

## 2018-08-19 NOTE — Progress Notes (Signed)
Occupational Therapy Session Note  Patient Details  Name: [name] MRN: [id number] Date of Birth: [birth year]  Today's Date: 08/19/2018 OT Individual Time: 7829-5621 OT Individual Time Calculation (min): 60 min    Short Term Goals: Week 1:  OT Short Term Goal 1 (Week 1): STGs equal to LTGs set at modified independent based on ELOS.  Skilled Therapeutic Interventions/Progress Updates:    Pt began session sitting EOB and working on doffing and donning left shoe and AFO.  He was able to complete with increased time and modified independence.  He then complete toilet transfer with use of the cane with modified independent.  Both pt and spouse report pt took his shower earlier this morning at modified independent level with her only helping with the AFO.  He then ambulated down to the dayroom for work on LUE and LLE strengthening with use of the Nustep.  Modified independent for mobility to the day room and back at end of session.  He completed the first 3 sets at level 5 resistance.  First set lasted 5 mins with use of all extremities and average number of steps at 65-70.  Second set was completed for 5 mins as well with isolated use of the LEs and steps per minute 45-50.  The next set with isolated use of the UE's but pt only able to tolerate 2 mins before needing rest.  For the final 2 sets were completed for 2 mins on resistance level 1.  Steps per minute with isolated arm use around 60.   Finished session with ambulation back to the room and pt left sitting EOB with spouse present.  Pt reporting fatigue this am as well from not sleeping well last night and all of his therapy from yesterday.    Therapy Documentation Precautions:  Precautions Precautions: Fall Restrictions Weight Bearing Restrictions: No  Pain: Pain Assessment Pain Scale: 0-10 Pain Score: 3  Pain Type: Acute pain Pain Orientation: Left Pain Descriptors / Indicators: Aching;Discomfort Pain Onset: With Activity Pain  Intervention(s): Repositioned Multiple Pain Sites: No ADL: ADL Eating: Independent Where Assessed-Eating: Edge of bed Grooming: Modified independent Where Assessed-Grooming: Standing at sink Upper Body Bathing: Modified independent Where Assessed-Upper Body Bathing: Shower Lower Body Bathing: Modified independent Where Assessed-Lower Body Bathing: Shower Upper Body Dressing: Independent Where Assessed-Upper Body Dressing: Edge of bed Lower Body Dressing: Modified independent Where Assessed-Lower Body Dressing: Edge of bed Toileting: Modified independent Where Assessed-Toileting: Teacher, adult education: Modified Community education officer Method: Ambulating(with use of single point cane) Acupuncturist: Raised toilet seat Tub/Shower Transfer: Modified independent Web designer Method: Ship broker: Insurance underwriter: Modified independent Film/video editor Method: Designer, industrial/product: Shower seat with back  Therapy/Group: Individual Therapy  Azaryah Heathcock OTR/L 08/19/2018, 10:16 AM

## 2018-08-19 NOTE — Progress Notes (Signed)
Physical Therapy Discharge Summary  Patient Details  Name: [name] MRN: [id number] Date of Birth: 1961/10/15  Today's Date: 08/19/2018 PT Individual Time: 4098-1191 PT Individual Time Calculation (min): 55 min    Pt in recliner and agreeable to therapy, denies pain. Performed functional mobility as detailed below including car transfer w/ supervision, gait in household and community environment, and gait on uneven surfaces w/ SPC in >150' bouts. No verbal cues needed for safety or technique throughout session, pt appropriately taking breaks when necessary. Performed Berg Balance Scale and explained significance of results to pt. Returned to room and ended session seated EOB, in care of family and all needs met.   Patient has met 8 of 8 long term goals due to improved activity tolerance, improved balance, improved postural control, increased strength and functional use of  left lower extremity.  Patient to discharge at an ambulatory level Supervision.  Patient's care partner is independent to provide the necessary physical assistance at discharge.  Reasons goals not met: n/a  Recommendation:  Patient will benefit from ongoing skilled PT services in outpatient setting to continue to advance safe functional mobility, address ongoing impairments in L hemiplegia, dynamic balance, global strength, and quality of gait, and minimize fall risk.  Equipment: SPC  Reasons for discharge: treatment goals met and discharge from hospital  Patient/family agrees with progress made and goals achieved: Yes  PT Discharge Precautions/Restrictions Precautions Precautions: Fall Restrictions Weight Bearing Restrictions: No Pain Pain Assessment Pain Scale: 0-10 Pain Score: 0-No pain Pain Location: Arm Pain Orientation: Left Pain Descriptors / Indicators: Aching;Discomfort Pain Onset: On-going Vision/Perception  Perception Perception: Within Functional Limits Praxis Praxis: Intact   Cognition Overall Cognitive Status: Within Functional Limits for tasks assessed Arousal/Alertness: Awake/alert Orientation Level: Oriented X4 Attention: Focused;Sustained Focused Attention: Appears intact Sustained Attention: Appears intact Selective Attention: Appears intact Memory: Appears intact Decreased Short Term Memory: Verbal basic Awareness: Appears intact Problem Solving: Appears intact Safety/Judgment: Appears intact Sensation Sensation Light Touch: Appears Intact Hot/Cold: Appears Intact Proprioception: Appears Intact Stereognosis: Appears Intact Coordination Gross Motor Movements are Fluid and Coordinated: Yes Fine Motor Movements are Fluid and Coordinated: No Coordination and Movement Description: Pt still with Brunnstrum stage V movement in the arm and hand.  He uses it during functional tasks at a diminshed level.  Heel Shin Test: LLE remains impaired, suspect 2/2 weakness Motor  Motor Motor: Hemiplegia Motor - Discharge Observations: Generalized weakeness, L hemi UE>LE  Mobility Bed Mobility Bed Mobility: Supine to Sit;Sit to Supine;Rolling Left;Rolling Right Rolling Right: Independent Rolling Left: Independent Supine to Sit: Independent Sit to Supine: Independent Transfers Transfers: Sit to Stand;Stand to Sit;Stand Pivot Transfers Sit to Stand: Independent Stand to Sit: Independent Stand Pivot Transfers: Independent with assistive device Transfer (Assistive device): Straight cane Locomotion  Gait Ambulation: Yes Gait Assistance: Supervision/Verbal cueing Gait Distance (Feet): 150 Feet Assistive device: Straight cane Gait Gait: Yes Gait Pattern: Impaired Gait Pattern: Decreased stance time - right Gait velocity: decreased Stairs / Additional Locomotion Stairs: Yes Stairs Assistance: Supervision/Verbal cueing Stair Management Technique: Two rails Number of Stairs: 12 Height of Stairs: 6 Ramp: Supervision/Verbal cueing Curb:  Supervision/Verbal cueing Wheelchair Mobility Wheelchair Mobility: No  Trunk/Postural Assessment  Cervical Assessment Cervical Assessment: Within Functional Limits Thoracic Assessment Thoracic Assessment: Within Functional Limits Lumbar Assessment Lumbar Assessment: Within Functional Limits Postural Control Postural Control: Within Functional Limits  Balance Balance Balance Assessed: Yes Berg Balance Test Sit to Stand: Able to stand without using hands and stabilize independently Standing Unsupported: Able to  stand safely 2 minutes Sitting with Back Unsupported but Feet Supported on Floor or Stool: Able to sit safely and securely 2 minutes Stand to Sit: Sits safely with minimal use of hands Transfers: Able to transfer safely, minor use of hands Standing Unsupported with Eyes Closed: Able to stand 10 seconds with supervision Standing Ubsupported with Feet Together: Able to place feet together independently and stand 1 minute safely From Standing, Reach Forward with Outstretched Arm: Can reach forward >12 cm safely (5") From Standing Position, Pick up Object from Floor: Able to pick up shoe safely and easily From Standing Position, Turn to Look Behind Over each Shoulder: Looks behind from both sides and weight shifts well Turn 360 Degrees: Able to turn 360 degrees safely but slowly Standing Unsupported, Alternately Place Feet on Step/Stool: Able to stand independently and complete 8 steps >20 seconds Standing Unsupported, One Foot in Front: Able to take small step independently and hold 30 seconds Standing on One Leg: Tries to lift leg/unable to hold 3 seconds but remains standing independently Total Score: 46 Static Sitting Balance Static Sitting - Balance Support: Feet supported Static Sitting - Level of Assistance: 7: Independent Dynamic Sitting Balance Dynamic Sitting - Balance Support: During functional activity Dynamic Sitting - Level of Assistance: 7: Independent Static  Standing Balance Static Standing - Balance Support: During functional activity Static Standing - Level of Assistance: 6: Modified independent (Device/Increase time) Dynamic Standing Balance Dynamic Standing - Balance Support: During functional activity Dynamic Standing - Level of Assistance: 6: Modified independent (Device/Increase time) Extremity Assessment  RUE Assessment RUE Assessment: Within Functional Limits LUE Assessment LUE Assessment: Exceptions to [facility] Passive Range of Motion (PROM) Comments: WFLs Active Range of Motion (AROM) Comments: Brunnstrum stage V movement in the hand and arm with isolated movement in the elbow and wrist.  Still with some synergy pattern with shoulder flexion but with compensation can achieve 100 degres.  Gross digit flexion AROM WFLS with extension AROM approximately 80% of full range.  He can opposte the thumb to the first digit but not any of the others.   RLE Assessment RLE Assessment: Within Functional Limits LLE Assessment LLE Assessment: Exceptions to [facility] Passive Range of Motion (PROM) Comments: [facility] General Strength Comments: 5/5 except 4+/5 hamstring and 1/5 DF    Kaiulani Sitton K Rebekkah Powless 08/19/2018, 4:30 PM

## 2018-08-19 NOTE — Progress Notes (Signed)
PHYSICAL MEDICINE & REHABILITATION PROGRESS NOTE  Subjective/Complaints: Pt sitting up in bed eating breakfast. Back sore, slept "funny" on it last night  ROS: Patient denies fever, rash, sore throat, blurred vision, nausea, vomiting, diarrhea, cough, shortness of breath or chest pain,   headache, or mood change.   Objective: Vital Signs: Blood pressure (!) 156/93, pulse 81, temperature 98.4 F (36.9 C), temperature source Oral, resp. rate 20, height 5\' 7"  (1.702 m), weight 90.3 kg, SpO2 97 %. No results found. No results for input(s): WBC, HGB, HCT, PLT in the last 72 hours. No results for input(s): NA, K, CL, CO2, GLUCOSE, BUN, CREATININE, CALCIUM in the last 72 hours.  Physical Exam: BP (!) 156/93   Pulse 81   Temp 98.4 F (36.9 C) (Oral)   Resp 20   Ht 5\' 7"  (1.702 m)   Wt 90.3 kg   SpO2 97%   BMI 31.18 kg/m  Constitutional: No distress . Vital signs reviewed. HEENT: EOMI, oral membranes moist Neck: supple Cardiovascular: RRR without murmur. No JVD    Respiratory: CTA Bilaterally without wheezes or rales. Normal effort    GI: BS +, non-tender, non-distended   Musculoskeletal: No edema or tenderness in extremities  Neurological: He is alert and oriented.  Follows commands.   Fair awareness of deficits Motor: RUE/RLE: 5/5 proximal to distal LUE: 4/5 proximally, 3+-4-/5 wrist extension and handgrip, stable LLE: 4/5 proximally,   2-2+/5 ankle dorsiflexion, stable Skin: Skin is warm and dry.  Psychiatric: He has a normal mood and affect. His behavior is normal.   Assessment/Plan: 1. Functional deficits secondary to right pontine infarct status post TPA which require 3+ hours per day of interdisciplinary therapy in a comprehensive inpatient rehab setting.  Physiatrist is providing close team supervision and 24 hour management of active medical problems listed below.  Physiatrist and rehab team continue to assess barriers to discharge/monitor patient progress  toward functional and medical goals  Care Tool:  Bathing    Body parts bathed by patient: Right arm, Left arm, Chest, Abdomen, Front perineal area, Buttocks, Right upper leg, Left upper leg, Right lower leg, Left lower leg, Face         Bathing assist Assist Level: Independent with assistive device Assistive Device Comment: tub seat   Upper Body Dressing/Undressing Upper body dressing   What is the patient wearing?: Pull over shirt    Upper body assist Assist Level: Independent    Lower Body Dressing/Undressing Lower body dressing    Lower body dressing activity did not occur: N/A What is the patient wearing?: Pants     Lower body assist Assist for lower body dressing: Independent     Toileting Toileting Toileting Activity did not occur (Clothing management and hygiene only): N/A (no void or bm)  Toileting assist Assist for toileting: Independent with assistive device Assistive Device Comment: single point cane   Transfers Chair/bed transfer  Transfers assist  Chair/bed transfer activity did not occur: N/A  Chair/bed transfer assist level: Supervision/Verbal cueing     Locomotion Ambulation   Ambulation assist      Assist level: Supervision/Verbal cueing Assistive device: Cane-straight Max distance: 200'   Walk 10 feet activity   Assist     Assist level: Supervision/Verbal cueing Assistive device: Cane-straight   Walk 50 feet activity   Assist    Assist level: Supervision/Verbal cueing Assistive device: Cane-straight    Walk 150 feet activity   Assist    Assist level: Supervision/Verbal cueing Assistive device:  Cane-straight    Walk 10 feet on uneven surface  activity   Assist     Assist level: Supervision/Verbal cueing Assistive device: Ship broker Will patient use wheelchair at discharge?: No             Wheelchair 50 feet with 2 turns activity    Assist             Wheelchair 150 feet activity     Assist           Medical Problem List and Plan: 1.  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  Aspirin and Plavix x3 weeks then aspirin alone  Continue CIR  Plan for d/c tomorrow  Will see patient for transitional care management in 1-2 weeks post-discharge  PRAFO 2.  DVT Prophylaxis/Anticoagulation: Subcutaneous Lovenox.  Monitor for any bleeding episodes 3. Pain Management: Neurontin 300 mg every 8 hours as needed 4. Mood: Provide emotional support 5. Neuropsych: This patient is capable of making decisions on his own behalf. 6. Skin/Wound Care: Routine skin checks 7. Fluids/Electrolytes/Nutrition: Routine in and outs 8.  Hypertension.  Norvasc 5 mg daily, Lotensin 10 mg daily.     -remains elevated  -avoid overtreating--continue same dosing, may need 10mg  norvasc as outpt 9.  Hyperlipidemia.  Lipitor 10.  Tobacco as well as polysubstance abuse.  Urine drug screen positive cocaine and marijuana.  Continue NicoDerm patch.  Provide counseling 11.  BPH.  Flomax 0.4 mg nightly.    pvr's ok 12.  GERD.  Prilosec  13.  CKD stage II  Creatinine 1.05 on 10/22  Continue to monitor  LOS: 5 days A FACE TO FACE EVALUATION WAS PERFORMED  Ranelle Oyster 08/19/2018, 9:59 AM

## 2018-08-19 NOTE — Progress Notes (Signed)
Occupational Therapy Discharge Summary  Patient Details  Name: [name] MRN: [id number] Date of Birth: 1961-01-11  Today's Date: 08/19/2018 OT Individual Time: 1420-1529 OT Individual Time Calculation (min): 69 min   Session Note:  Pt completed functional mobility to the dayroom with modified independence and use of the cane.  Once in the dayroom worked on RUE and RLE Brewing technologist with use of the WII.  Had pt stand and use the LUE for gross motor coordination and FM coordination as well as LLE for strengthening and balance.  He was able to step forward with the LLE and maintain balance with modified independence.  He needed increased time for LUE use, and for some games, he needed BUE use to complete.  Pt sat for intervals of time when needed for rest but then stood for his turn in the game.  Modified independent for completion of sit to stand.  He completed toilet transfer to handicapped toilet as well during session with modified independent.  Finished session with ambulation back to the room and issuing of FM coordination handout to pt and spouse with demonstration of each exercise by therapist.  Pt's spouse is able to read instructions and educate pt at home.     Patient has met 11 of 11 long term goals due to improved activity tolerance, improved balance, postural control, ability to compensate for deficits, functional use of  LEFT upper and LEFT lower extremity and improved coordination.  Patient to discharge at overall Modified Independent level.  Patient's care partner is independent to provide the necessary physical assistance at discharge.    Reasons goals not met: NA  Recommendation:  Patient will benefit from ongoing skilled OT services in outpatient setting to continue to advance functional skills in the area of BADL, iADL, Vocation and Reduce care partner burden.  Pt still demonstrates left UE and LE hemiparesis.  Feel he will continue to benefit from outpatient OT to  progress back to independent level for all ADLs and IADLS as well as increasing LUE functional use and strength to [facility].    Equipment: tub bench and single point cane  Reasons for discharge: treatment goals met and discharge from hospital  Patient/family agrees with progress made and goals achieved: Yes  OT Discharge Precautions/Restrictions  Precautions Precautions: Fall Restrictions Weight Bearing Restrictions: No Therapy Vitals Temp: 98.6 F (37 C) Pulse Rate: 99 Resp: 16 BP: (!) 152/91 Patient Position (if appropriate): Lying Oxygen Therapy SpO2: 99 % O2 Device: Room Air Pain Pain Assessment Pain Scale: 0-10 Pain Score: 3  Pain Type: Acute pain Pain Orientation: Left Pain Descriptors / Indicators: Aching;Discomfort Pain Onset: With Activity Pain Intervention(s): Repositioned Multiple Pain Sites: No ADL ADL Eating: Independent Where Assessed-Eating: Edge of bed Grooming: Modified independent Where Assessed-Grooming: Standing at sink Upper Body Bathing: Modified independent Where Assessed-Upper Body Bathing: Shower Lower Body Bathing: Modified independent Where Assessed-Lower Body Bathing: Shower Upper Body Dressing: Independent Where Assessed-Upper Body Dressing: Edge of bed Lower Body Dressing: Modified independent Where Assessed-Lower Body Dressing: Edge of bed Toileting: Modified independent Where Assessed-Toileting: Glass blower/designer: Modified Programmer, applications Method: Ambulating(with use of single point cane) Science writer: Raised toilet seat Tub/Shower Transfer: Modified independent Tub/Shower Transfer Method: Optometrist: Facilities manager: Modified independent Social research officer, government Method: Heritage manager: Civil engineer, contracting with back Vision Baseline Vision/History: No visual deficits Vision Assessment?: No apparent visual deficits Perception  Perception:  Within Functional Limits Praxis Praxis: Intact Cognition Overall  Cognitive Status: Within Functional Limits for tasks assessed Arousal/Alertness: Awake/alert Orientation Level: Oriented X4 Attention: Focused;Sustained Focused Attention: Appears intact Sustained Attention: Appears intact Selective Attention: Appears intact Memory: Appears intact Decreased Short Term Memory: Verbal basic Awareness: Appears intact Safety/Judgment: Appears intact Sensation Sensation Light Touch: Appears Intact Hot/Cold: Appears Intact Proprioception: Appears Intact Stereognosis: Appears Intact Coordination Gross Motor Movements are Fluid and Coordinated: No Fine Motor Movements are Fluid and Coordinated: No Coordination and Movement Description: Pt still with Brunnstrum stage V movement in the arm and hand.  He uses it during functional tasks at a diminshed level.  Motor  Motor Motor: Hemiplegia Motor - Discharge Observations: Sitll with mild left hemiparesis Mobility  Bed Mobility Bed Mobility: Supine to Sit;Sit to Supine Supine to Sit: Independent Sit to Supine: Independent Transfers Sit to Stand: Independent  Trunk/Postural Assessment  Cervical Assessment Cervical Assessment: Within Functional Limits Thoracic Assessment Thoracic Assessment: Within Functional Limits  Balance Balance Balance Assessed: Yes Static Sitting Balance Static Sitting - Balance Support: Feet supported Static Sitting - Level of Assistance: 7: Independent Dynamic Sitting Balance Dynamic Sitting - Balance Support: During functional activity Dynamic Sitting - Level of Assistance: 7: Independent Static Standing Balance Static Standing - Balance Support: During functional activity Static Standing - Level of Assistance: 6: Modified independent (Device/Increase time) Dynamic Standing Balance Dynamic Standing - Balance Support: During functional activity Dynamic Standing - Level of Assistance: 6: Modified  independent (Device/Increase time) Extremity/Trunk Assessment RUE Assessment RUE Assessment: Within Functional Limits LUE Assessment LUE Assessment: Exceptions to [facility] Passive Range of Motion (PROM) Comments: WFLs Active Range of Motion (AROM) Comments: Brunnstrum stage V movement in the hand and arm with isolated movement in the elbow and wrist.  Still with some synergy pattern with shoulder flexion but with compensation can achieve 100 degres.  Gross digit flexion AROM WFLS with extension AROM approximately 80% of full range.  He can opposte the thumb to the first digit but not any of the others.     Brody Kump OTR/L 08/19/2018, 3:34 PM

## 2018-08-20 NOTE — Progress Notes (Signed)
Patient escorted out to vehicle by RN, Spouse. All belongings accounted for, no questions at discharge.

## 2018-08-20 NOTE — Progress Notes (Signed)
[facility] PHYSICAL MEDICINE & REHABILITATION PROGRESS NOTE  Subjective/Complaints: Pt dressed and ready for dc. No complaints  ROS: Patient denies fever, rash, sore throat, blurred vision, nausea, vomiting, diarrhea, cough, shortness of breath or chest pain, joint or back pain, headache, or mood change.   Objective: Vital Signs: Blood pressure (!) 153/90, pulse 78, temperature 98 F (36.7 C), temperature source Oral, resp. rate 18, height 5\' 7"  (1.702 m), weight 90.3 kg, SpO2 100 %. No results found. No results for input(s): WBC, HGB, HCT, PLT in the last 72 hours. No results for input(s): NA, K, CL, CO2, GLUCOSE, BUN, CREATININE, CALCIUM in the last 72 hours.  Physical Exam: BP (!) 153/90 (BP Location: Left Arm)   Pulse 78   Temp 98 F (36.7 C) (Oral)   Resp 18   Ht 5\' 7"  (1.702 m)   Wt 90.3 kg   SpO2 100%   BMI 31.18 kg/m  Constitutional: No distress . Vital signs reviewed. HEENT: EOMI, oral membranes moist Neck: supple Cardiovascular: RRR without murmur. No JVD    Respiratory: CTA Bilaterally without wheezes or rales. Normal effort    GI: BS +, non-tender, non-distended    Musculoskeletal: No edema or tenderness in extremities  Neurological: He is alert and oriented.  Follows commands.   Fair awareness of deficits Motor: RUE/RLE: 5/5 proximal to distal LUE: 4/5 proximally, 3+-4-/5 wrist extension and handgrip,no change LLE: 4/5 proximally,   2-2+/5 ankle dorsiflexion, stable Skin: Skin is warm and dry.  Psychiatric: He has a normal mood and affect. His behavior is normal.   Assessment/Plan: 1. Functional deficits secondary to right pontine infarct status post TPA which require 3+ hours per day of interdisciplinary therapy in a comprehensive inpatient rehab setting.  Physiatrist is providing close team supervision and 24 hour management of active medical problems listed below.  Physiatrist and rehab team continue to assess barriers to discharge/monitor patient  progress toward functional and medical goals  Care Tool:  Bathing    Body parts bathed by patient: Right arm, Left arm, Chest, Abdomen, Front perineal area, Buttocks, Right upper leg, Left upper leg, Right lower leg, Left lower leg, Face         Bathing assist Assist Level: Independent with assistive device Assistive Device Comment: tub seat   Upper Body Dressing/Undressing Upper body dressing   What is the patient wearing?: Pull over shirt    Upper body assist Assist Level: Independent    Lower Body Dressing/Undressing Lower body dressing    Lower body dressing activity did not occur: N/A What is the patient wearing?: Pants     Lower body assist Assist for lower body dressing: Independent     Toileting Toileting Toileting Activity did not occur (Clothing management and hygiene only): N/A (no void or bm)  Toileting assist Assist for toileting: Supervision/Verbal cueing Assistive Device Comment: (single point cane)   Transfers Chair/bed transfer  Transfers assist  Chair/bed transfer activity did not occur: N/A  Chair/bed transfer assist level: Independent with assistive device Chair/bed transfer assistive device: Theatre manager   Ambulation assist      Assist level: Supervision/Verbal cueing Assistive device: Cane-straight Max distance: >150'    Walk 10 feet activity   Assist     Assist level: Supervision/Verbal cueing Assistive device: Cane-straight   Walk 50 feet activity   Assist    Assist level: Supervision/Verbal cueing Assistive device: Cane-straight    Walk 150 feet activity   Assist    Assist level:  Supervision/Verbal cueing Assistive device: Cane-straight    Walk 10 feet on uneven surface  activity   Assist     Assist level: Supervision/Verbal cueing Assistive device: Cane-straight   Wheelchair     Assist Will patient use wheelchair at discharge?: No   Wheelchair activity did not occur: N/A          Wheelchair 50 feet with 2 turns activity    Assist    Wheelchair 50 feet with 2 turns activity did not occur: N/A       Wheelchair 150 feet activity     Assist Wheelchair 150 feet activity did not occur: N/A         Medical Problem List and Plan: 1.  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  Aspirin and Plavix x3 weeks then aspirin alone  Dc home today  MD will see patient for transitional care management in 1-2 weeks post-discharge  -no driving  PRAFO 2.  DVT Prophylaxis/Anticoagulation: Subcutaneous Lovenox.  Monitor for any bleeding episodes 3. Pain Management: Neurontin 300 mg every 8 hours as needed 4. Mood: Provide emotional support 5. Neuropsych: This patient is capable of making decisions on his own behalf. 6. Skin/Wound Care: Routine skin checks 7. Fluids/Electrolytes/Nutrition: Routine in and outs 8.  Hypertension.  Norvasc 5 mg daily, Lotensin 10 mg daily.     -remains elevated  -may need increase to 10mg  norvasc 9.  Hyperlipidemia.  Lipitor 10.  Tobacco as well as polysubstance abuse.  Urine drug screen positive cocaine and marijuana.  Continue NicoDerm patch.  Provide counseling 11.  BPH.  Flomax 0.4 mg nightly.    pvr's ok 12.  GERD.  Prilosec  13.  CKD stage II  Creatinine 1.05 on 10/22  Continue to monitor  LOS: 6 days A FACE TO FACE EVALUATION WAS PERFORMED  Ranelle Oyster 08/20/2018, 7:58 AM

## 2018-08-22 ENCOUNTER — Telehealth: Payer: Self-pay

## 2018-08-22 NOTE — Telephone Encounter (Signed)
Transitional Care call--wife Dois Davenport   1. Are you/is patient experiencing any problems since coming home?no  Are there any questions regarding any aspect of care?no 2. Are there any questions regarding medications administration/dosing? No Are meds being taken as prescribed?yes  Patient should review meds with caller to confirm 3. Have there been any falls?no  4. Has Home Health been to the house and/or have they contacted you? Outpatient, yes If not, have you tried to contact them? Can we help you contact them? 5. Are bowels and bladder emptying properly?yes  Are there any unexpected incontinence issues?no  If applicable, is patient following bowel/bladder programs? 6. Any fevers, problems with breathing, unexpected pain?no 7. Are there any skin problems or new areas of breakdown?no 8. Has the patient/family member arranged specialty MD follow up (ie cardiology/neurology/renal/surgical/etc)? making appts today  Can we help arrange? 9. Does the patient need any other services or support that we can help arrange? Transportation, I advised to call transit in their county 10. Are caregivers following through as expected in assisting the patient?Yes 11. Has the patient quit smoking, drinking alcohol, or using drugs as recommended?Yes  Appointment time, arrive time 11:00 for 11:20 08/31/18 with Dr. Allena Katz  [address] [phone]

## 2018-08-30 ENCOUNTER — Encounter (HOSPITAL_COMMUNITY): Payer: Self-pay

## 2018-08-30 ENCOUNTER — Ambulatory Visit: Payer: Self-pay | Admitting: Physician Assistant

## 2018-08-30 ENCOUNTER — Other Ambulatory Visit: Payer: Self-pay

## 2018-08-30 ENCOUNTER — Ambulatory Visit (HOSPITAL_COMMUNITY): Payer: Self-pay | Attending: Physician Assistant

## 2018-08-30 ENCOUNTER — Ambulatory Visit (HOSPITAL_COMMUNITY): Payer: Self-pay

## 2018-08-30 DIAGNOSIS — I6350 Cerebral infarction due to unspecified occlusion or stenosis of unspecified cerebral artery: Secondary | ICD-10-CM

## 2018-08-30 DIAGNOSIS — R29898 Other symptoms and signs involving the musculoskeletal system: Secondary | ICD-10-CM

## 2018-08-30 DIAGNOSIS — M6281 Muscle weakness (generalized): Secondary | ICD-10-CM | POA: Insufficient documentation

## 2018-08-30 DIAGNOSIS — R278 Other lack of coordination: Secondary | ICD-10-CM

## 2018-08-30 NOTE — Patient Instructions (Signed)
Tandem Stance    Right foot in front of left, heel touching toe both feet "straight ahead". Stand on Foot Triangle of Support with both feet. Balance in this position _30__ seconds. Do with left foot in front of right. Repeat 3 times each way. YOU CAN ADD A PILLOW OR COUCH CUSHION UNDER YOUR FEET TO MAKE IT HARDER.  Copyright  VHI. All rights reserved.  Tandem Standing    Stand heel to toe: 1.Look right _10__ times. 2. Look left _10__ times. 3. Look up 10___ times. 4. With right arm, reach right, left, forward and back. Switch forward foot. Do _1___ sessions per day.  COULD ADD PILLOW OR COUCH CUSHION TO MAKE HARDER WHEN YOU ARE READY.   http://gt2.exer.us/541   Copyright  VHI. All rights reserved.   Bridge    Lying on back, legs bent 90, feet flat on floor. Press up hips and torso, reaching hands to feet.   Copyright  VHI. All rights reserved.   Advance Knee Strengthener    Leaning on wall, bend knees and slowly lower body until legs form a 45-90 angle. Return. Inhale while lowering, and exhale while raising the body. Hold 5 seconds. Repeat 10____ times. Do __1__ sessions per day.  http://gt2.exer.us/271   Copyright  VHI. All rights reserved.   Lunge (Backward)    NOT YET - Hold __ pound ball in front of chest.  Lunge backward on left leg. Repeat 10__ times. Repeat with other leg for set. Rest 30__ seconds after set. Do 1__ sets per session.  Copyright  VHI. All rights reserved.    Heel Raise: Bilateral (Standing)    Rise on balls of feet. And lift toes up. Repeat _10-15___ times per set. Do _1___ sets per session. Do __1__ sessions per day.  http://orth.exer.us/38   Copyright  VHI. All rights reserved.

## 2018-08-30 NOTE — Patient Instructions (Signed)
   Coordination Activities  Perform the following activities for 15-20 minutes 2-3 times per day with left hand(s).   Flip cards 1 at a time as fast as you can.  Deal cards with your thumb (Hold deck in hand and push card off top with thumb).  Rotate card in hand (clockwise and counter-clockwise).  Shuffle cards.    10-15 repetitions for each putty exercise: PUTTY PINCH  Roll up some putty to create a small tubular section. Next, pinch the putty and repeat down the section.     PUTTY GRIP  Place putty in your hand and squeeze it firmly and slowly. Reshape it and repeat.     PUTTY THUMB FLEXION  Press your the tip of your thumb into the putty as shown.   PUTTY EXTENSION TABLE SPREAD  Roll up some putty into a ball and then press and flatten it on a table. Next, spread the putty out with your finger tips as shown.      Strengthening: Chest Pull - Resisted   Hold Theraband in front of body with hands about shoulder width a part. Pull band a part and back together slowly. Repeat __10-15__ times. Complete __1__ set(s) per session.. Repeat ____ session(s) per day.  http://orth.exer.us/926   Copyright  VHI. All rights reserved.   PNF Strengthening: Resisted   Standing with resistive band around each hand, bring right arm up and away, thumb back. Repeat _10-15___ times per set. Do ___1_ sets per session. Do ____ sessions per day.          PNF Strengthening: Resisted   Standing, hold resistive band above head. Bring right arm down and out from side. Repeat _10-15___ times per set. Do __1__ sets per session. Do ____ sessions per day.  http://orth.exer.us/922   Copyright  VHI. All rights reserved.

## 2018-08-30 NOTE — Therapy (Signed)
[facility] [address] [place], [place], [address] Phone: [phone]   Fax:  [phone]  Occupational Therapy Evaluation  Patient Details  Name: [name] MRN: [id number] Date of Birth: 04/25/1961 Referring Provider (OT): Cruzita Lederer, PA-C   Encounter Date: 08/30/2018  OT End of Session - 08/30/18 1236    Visit Number  1    Number of Visits  1    Authorization Type  Self Pay    OT Start Time  0936    OT Stop Time  1015    OT Time Calculation (min)  39 min    Activity Tolerance  Patient tolerated treatment well    Behavior During Therapy  [facility] for tasks assessed/performed       Past Medical History:  Diagnosis Date  . Acid reflux   . Hypertension   . Kidney stone     Past Surgical History:  Procedure Laterality Date  . CHOLECYSTECTOMY      There were no vitals filed for this visit.  Subjective Assessment - 08/30/18 0944    Subjective   S: I just want to be able to get back to driving and go back to work.     Pertinent History  Patient is a 56 y/o male S/P right pontinue infarct which occurred on 08/10/18. patient received TPA. He was received therapy services in CIR as well as home health. Mariam Dollar, PA was referred patient to occupational therapy for evaluation and treatment.     Patient Stated Goals  To be able to go back to work.    Currently in Pain?  No/denies        [facility] OT Assessment - 08/30/18 0945      Assessment   Medical Diagnosis  Left side weakness S/P right CVA    Referring Provider (OT)  Cruzita Lederer, PA-C    Onset Date/Surgical Date  08/10/18    Hand Dominance  Right    Next MD Visit  09/13/18    Prior Therapy  CIR and Home health therapy      Precautions   Precautions  None      Restrictions   Weight Bearing Restrictions  No      Balance Screen   Has the patient fallen in the past 6 months  No      Home  Environment   Family/patient expects to be discharged to:  Private  residence    Living Arrangements  Spouse/significant other    Home Equipment  [place] - single point    Lives With  Spouse      Prior Function   Level of Independence  Independent    Vocation  Full time employment    Vocation Requirements  Dump truck driver      ADL   ADL comments  Difficulty holding onto items (drinks, etc)      Mobility   Mobility Status  Independent      Written Expression   Dominant Hand  Right      Vision - History   Baseline Vision  No visual deficits      Cognition   Overall Cognitive Status  Within Functional Limits for tasks assessed      Observation/Other Assessments   Focus on Therapeutic Outcomes Forensic psychologist)   N/A      Coordination   9 Hole Peg Test  Right;Left    Right 9 Hole Peg Test  22.7"    Left 9 Hole Peg  Test  32.3"    Coordination  Coordination slower on left hand when completing 9 hole peg test.       ROM / Strength   AROM / PROM / Strength  Strength;AROM      AROM   Overall AROM   Within functional limits for tasks performed    Overall AROM Comments  Shoulder, elbow, wrist LUE      Strength   Overall Strength Comments  Assessed seated. IR/er adducted    Strength Assessment Site  Shoulder;Elbow;Hand    Right/Left Shoulder  Left    Left Shoulder Flexion  4+/5    Left Shoulder ABduction  4+/5    Left Shoulder Internal Rotation  5/5    Left Shoulder External Rotation  4+/5    Right/Left Elbow  Left    Left Elbow Flexion  5/5    Left Elbow Extension  4+/5    Right/Left hand  Left;Right    Right Hand Grip (lbs)  95    Right Hand Lateral Pinch  31 lbs    Right Hand 3 Point Pinch  27 lbs    Left Hand Grip (lbs)  70    Left Hand Lateral Pinch  20 lbs    Left Hand 3 Point Pinch  19 lbs                      OT Education - 08/30/18 1235    Education Details  coordination activities (cards), green and blue theraputty for grip and pinch strengthening, blue theraband shoulder strengthening.     Person(s) Educated  Patient     Methods  Explanation;Demonstration;Verbal cues;Handout    Comprehension  Verbalized understanding       OT Short Term Goals - 08/30/18 1241      OT SHORT TERM GOAL #1   Title  Patient will be educated on HEP to increase LUE shoulder strength, hand strength and coordination in order to allow him to return to work safely while using his LUE as close to baseline as possible.     Time  1    Period  Days    Status  Achieved    Target Date  08/30/18               Plan - 08/30/18 1237    Clinical Impression Statement  A: Patient is a 57 y/o male S/P right CVA presenting to OT with mild left side weakness in shoulder and hand causing decreased coordination and ability to hold onto heavier household items such as drinks. Due to low level of weakness and patient's requirement to self pay therapist discussed options for treatment. It was determined that patient will complete a HEP at home independently to focus on mild weakness in LUE. Patient is in agreement with recommendation.     Occupational Profile and client history currently impacting functional performance  Patient is motivated to complete HEP at home independently.     Occupational performance deficits (Please refer to evaluation for details):  Leisure;Work    Mining engineer    Current Impairments/barriers affecting progress:  Lack of insurance making patient soley responsible for completing HEP.     OT Frequency  One time visit    OT Treatment/Interventions  Patient/family education    Plan  P: 1 time visit with HEP.     Clinical Decision Making  Limited treatment options, no task modification necessary    Consulted and Agree with Plan of Care  Patient       Patient will benefit from skilled therapeutic intervention in order to improve the following deficits and impairments:  Decreased coordination, Decreased strength  Visit Diagnosis: Other symptoms and signs involving the musculoskeletal system - Plan: Ot  plan of care cert/re-cert  Other lack of coordination - Plan: Ot plan of care cert/re-cert    Problem List Patient Active Problem List   Diagnosis Date Noted  . Labile blood pressure   . CKD (chronic kidney disease), stage II   . Benign prostatic hyperplasia   . Hyperlipidemia 08/14/2018  . Tobacco use disorder 08/14/2018  . Obesity 08/14/2018  . GERD (gastroesophageal reflux disease) 08/14/2018  . Leg cramp, left 08/14/2018  . Elevated blood pressure reading with diagnosis of hypertension   . Diastolic dysfunction   . Polysubstance abuse (HCC)   . Right pontine cerebrovascular accident [facility]) s/p tPA 08/11/2018  . Hypertension 02/17/2014   Limmie Patricia, OTR/L,CBIS  [phone]  08/30/2018, 12:43 PM  [facility] [address] [place], [place], [address] Phone: [phone]   Fax:  [phone]  Name: [name] MRN: [id number] Date of Birth: January 05, 1961

## 2018-08-30 NOTE — Therapy (Signed)
[facility] [address] [place], [place], [address] Phone: [phone]   Fax:  [phone]  Physical Therapy Evaluation/Discharge Summary  Patient Details  Name: [name] MRN: [id number] Date of Birth: January 26, 1961 No data recorded  Encounter Date: 08/30/2018  PT End of Session - 08/30/18 1246    Visit Number  1    Number of Visits  1    Authorization Type  self-pay    PT Start Time  1116    PT Stop Time  1200    PT Time Calculation (min)  44 min    Activity Tolerance  Patient tolerated treatment well    Behavior During Therapy  [facility] for tasks assessed/performed       Past Medical History:  Diagnosis Date  . Acid reflux   . Hypertension   . Kidney stone     Past Surgical History:  Procedure Laterality Date  . CHOLECYSTECTOMY      There were no vitals filed for this visit.   Subjective Assessment - 08/30/18 1121    Subjective  Had s stroke 08/11/2018 with resulting left sided weakness; mostly has trouble with left arm strength. Now uses a SPC every once in awhile when walking a long time like at [facility] or if is doing a lot at the house through the day. Mostly because his left leg starts to feel weak. Has AFO now becuase his left knee sometimes will bend backward. Did have foot drop on the left at the hospital    Limitations  Walking    Currently in Pain?  No/denies         [facility] PT Assessment - 08/30/18 1247      Assessment   Medical Diagnosis  Left side weakness S/P right CVA    Onset Date/Surgical Date  08/10/18    Hand Dominance  Right    Next MD Visit  09/13/18    Prior Therapy  CIR and Home health therapy      Precautions   Precautions  None      Restrictions   Weight Bearing Restrictions  No      Prior Function   Level of Independence  Independent    Vocation  Full time employment    Vocation Requirements  Dump truck driver      Cognition   Overall Cognitive Status  Within Functional Limits for  tasks assessed      ROM / Strength   AROM / PROM / Strength  Strength      Strength   Left Hip Flexion  4/5    Left Hip Extension  4/5    Left Knee Flexion  4-/5    Left Knee Extension  4/5    Left Ankle Dorsiflexion  4/5      Transfers   Five time sit to stand comments   12.93      Ambulation/Gait   Ambulation Distance (Feet)  316 Feet     Assistive device  None    Gait Pattern  Step-through pattern;Decreased stride length;Decreased hip/knee flexion - left;Left flexed knee in stance    Ambulation Surface  Level;Indoor    Gait velocity  0.87 m/s      Balance   Balance Assessed  Yes      Static Standing Balance   Static Standing - Balance Support  No upper extremity supported    Static Standing - Level of Assistance  5: Stand by assistance    Static  Standing Balance -  Activities   Single Leg Stance - Right Leg;Single Leg Stance - Left Leg    Static Standing - Comment/# of Minutes  Rt >30 seconds; Lt 2 seconds                Objective measurements completed on examination: See above findings.              PT Education - 08/30/18 1245    Education Details  Examination findings, plan of care and HEP.    Person(s) Educated  Patient    Methods  Explanation;Demonstration;Handout    Comprehension  Verbalized understanding;Returned demonstration       PT Short Term Goals - 08/30/18 1359      PT SHORT TERM GOAL #1   Title  Patient independent in HEP performance to independent carry over at home to increase LE strength and balance and return to PLOF.    Time  1    Period  Days    Status  New    Target Date  08/29/18                Plan - 08/30/18 1258    Clinical Impression Statement  Patient is a 57 year old male presenting to outpatient physical therapy with left sided weakness after a CVA incident 08/10/2018. Patient has had a large return in function and strength in the left LE but continues to have less than normal strength and  function. 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 Patient evaluated today without his AFO orthotic donned. Deficits noted in left LE strength, functional ability, gait and balance. Patient would benefit from physical therapy to address the above deficits and return to PLOF and improved QOL. However, patient does not have insurance at this time and would be self pay. PT instructed patient in a comprehensive and functional HEP focusing on strength of LEs and balance in hopes patient will perform HEP as directed and be able to regain his strength and balance independently. PT advised patient to return for another evaluation and updated HEP in 3-4 months if he is still impacted by his deficits. Patient will progress in weaning out of his AFO for short distances and will utilize his [facility] without AFO for safety. Patient discharged from PT at this time.    Clinical Presentation due to:  MMT, functional deficits, , SLS clinical judgement    Clinical Decision Making  Low    PT Next Visit Plan  Discharged to HEP.    PT Home Exercise Plan  initial - tandem balance, tandem balance with UE and head movements, supine bridge, wall sits, lunges, standing heel/toe raise.    Consulted and Agree with Plan of Care  Patient       Patient will benefit from skilled therapeutic intervention in order to improve the following deficits and impairments:  Abnormal gait, Decreased endurance, Decreased activity tolerance, Decreased strength, Decreased balance, Difficulty walking  Visit Diagnosis: Other symptoms and signs involving the musculoskeletal system  Other lack of coordination  Right pontine cerebrovascular accident [facility]) s/p tPA  Muscle weakness (generalized)     Problem List Patient Active Problem List   Diagnosis Date Noted  . Labile blood pressure   . CKD (chronic kidney disease), stage II   . Benign prostatic hyperplasia   .  Hyperlipidemia 08/14/2018  . Tobacco use disorder 08/14/2018  . Obesity 08/14/2018  .  GERD (gastroesophageal reflux disease) 08/14/2018  . Leg cramp, left 08/14/2018  . Elevated blood pressure reading with diagnosis of hypertension   . Diastolic dysfunction   . Polysubstance abuse (HCC)   . Right pontine cerebrovascular accident [facility]) s/p tPA 08/11/2018  . Hypertension 02/17/2014    Katina Dung. Hartnett-Rands, MS, PT Per Ladoris [facility] #[id number] 08/30/2018, 2:01 PM  [facility] [address] [place], [place], [address] Phone: [phone]   Fax:  [phone]  Name: [name] MRN: [id number] Date of Birth: [birth year]

## 2018-08-31 ENCOUNTER — Encounter: Payer: Self-pay | Admitting: Physical Medicine & Rehabilitation

## 2018-08-31 ENCOUNTER — Encounter: Payer: Self-pay | Attending: Physical Medicine & Rehabilitation | Admitting: Physical Medicine & Rehabilitation

## 2018-08-31 VITALS — BP 155/98 | HR 93 | Ht 69.0 in | Wt 210.0 lb

## 2018-08-31 DIAGNOSIS — Z8249 Family history of ischemic heart disease and other diseases of the circulatory system: Secondary | ICD-10-CM | POA: Insufficient documentation

## 2018-08-31 DIAGNOSIS — Z7902 Long term (current) use of antithrombotics/antiplatelets: Secondary | ICD-10-CM | POA: Insufficient documentation

## 2018-08-31 DIAGNOSIS — R0989 Other specified symptoms and signs involving the circulatory and respiratory systems: Secondary | ICD-10-CM

## 2018-08-31 DIAGNOSIS — F1910 Other psychoactive substance abuse, uncomplicated: Secondary | ICD-10-CM | POA: Insufficient documentation

## 2018-08-31 DIAGNOSIS — Z7982 Long term (current) use of aspirin: Secondary | ICD-10-CM | POA: Insufficient documentation

## 2018-08-31 DIAGNOSIS — R531 Weakness: Secondary | ICD-10-CM | POA: Insufficient documentation

## 2018-08-31 DIAGNOSIS — Z87891 Personal history of nicotine dependence: Secondary | ICD-10-CM | POA: Insufficient documentation

## 2018-08-31 DIAGNOSIS — R269 Unspecified abnormalities of gait and mobility: Secondary | ICD-10-CM | POA: Insufficient documentation

## 2018-08-31 DIAGNOSIS — M792 Neuralgia and neuritis, unspecified: Secondary | ICD-10-CM

## 2018-08-31 DIAGNOSIS — I6350 Cerebral infarction due to unspecified occlusion or stenosis of unspecified cerebral artery: Secondary | ICD-10-CM | POA: Insufficient documentation

## 2018-08-31 DIAGNOSIS — I10 Essential (primary) hypertension: Secondary | ICD-10-CM | POA: Insufficient documentation

## 2018-08-31 DIAGNOSIS — F419 Anxiety disorder, unspecified: Secondary | ICD-10-CM | POA: Insufficient documentation

## 2018-08-31 DIAGNOSIS — F17200 Nicotine dependence, unspecified, uncomplicated: Secondary | ICD-10-CM

## 2018-08-31 NOTE — Progress Notes (Signed)
Subjective:    Patient ID: [name], male    DOB: [birth year], 57 y.o.   MRN: [id number]  HPI 57 year old right-handed male with history of hypertension, tobacco abuse presents for transitional care management after receiving CIR for right pontine infarct.   DATE OF ADMISSION 08/14/2018 DATE OF DISCHARGE: 08/18/2018   At discharge, he was instructed to abstain for Etoh and tobacco, which he states he is doing.  He is still on ASA/Plavix.  Pain is controlled.  BP is elevated, pt states he it is usually labile. Denies falls. He sees Neurology in 2 weeks. Does not have an appointment with PCP.  Therapies: Released DME: shower chair Mobility: Cane at all times   Pain Inventory Average Pain na Pain Right Now na My pain is na  In the last 24 hours, has pain interfered with the following? General activity 1 Relation with others 0 Enjoyment of life 0 What TIME of day is your pain at its worst? night Sleep (in general) Fair  Pain is worse with: na Pain improves with: rest and medication Relief from Meds: 2  Mobility walk without assistance ability to climb steps?  yes  Function not employed: date last employed .  Neuro/Psych bladder control problems spasms anxiety  Prior Studies Any changes since last visit?  no  Physicians involved in your care Any changes since last visit?  no   Family History  Problem Relation Age of Onset  . Hypertension Father   . Heart disease Father   . Hyperlipidemia Father   . Diabetes Father    Social History   Socioeconomic History  . Marital status: Married    Spouse name: Not on file  . Number of children: Not on file  . Years of education: Not on file  . Highest education level: Not on file  Occupational History  . Not on file  Social Needs  . Financial resource strain: Not on file  . Food insecurity:    Worry: Not on file    Inability: Not on file  . Transportation needs:    Medical: Not on file    Non-medical: Not  on file  Tobacco Use  . Smoking status: Former Smoker    Packs/day: 2.00    Types: Cigarettes    Last attempt to quit: 01/15/2012    Years since quitting: 6.6  . Smokeless tobacco: Never Used  Substance and Sexual Activity  . Alcohol use: No    Alcohol/week: 0.0 standard drinks  . Drug use: No  . Sexual activity: Not on file  Lifestyle  . Physical activity:    Days per week: Not on file    Minutes per session: Not on file  . Stress: Not on file  Relationships  . Social connections:    Talks on phone: Not on file    Gets together: Not on file    Attends religious service: Not on file    Active member of club or organization: Not on file    Attends meetings of clubs or organizations: Not on file    Relationship status: Not on file  Other Topics Concern  . Not on file  Social History Narrative  . Not on file   Past Surgical History:  Procedure Laterality Date  . CHOLECYSTECTOMY     Past Medical History:  Diagnosis Date  . Acid reflux   . Hypertension   . Kidney stone    BP (!) 155/98   Pulse 93   Ht  5\' 9"  (1.753 m)   Wt 210 lb (95.3 kg)   SpO2 95%   BMI 31.01 kg/m   Opioid Risk Score:   Fall Risk Score:  `1  Depression screen PHQ 2/9  Depression screen PHQ 2/9 08/31/2018  Decreased Interest 3  Down, Depressed, Hopeless 1  PHQ - 2 Score 4  Altered sleeping 1  Tired, decreased energy 1  Change in appetite 0  Feeling bad or failure about yourself  0  Trouble concentrating 0  Moving slowly or fidgety/restless 0  Suicidal thoughts 0  PHQ-9 Score 6  Difficult doing work/chores Somewhat difficult    Review of Systems  Constitutional: Negative.   HENT: Negative.   Eyes: Negative.   Respiratory: Positive for wheezing.   Cardiovascular: Negative.   Gastrointestinal: Negative.   Endocrine: Negative.   Genitourinary: Positive for difficulty urinating.  Musculoskeletal: Positive for myalgias.  Skin: Negative.   Allergic/Immunologic: Negative.     Neurological: Negative.   Hematological: Negative.   Psychiatric/Behavioral: The patient is nervous/anxious.   All other systems reviewed and are negative.      Objective:   Physical Exam Constitutional: No distress . Vital signs reviewed. HENT: Normocephalic.  Atraumatic. Eyes: EOMI. No discharge. Cardiovascular: RRR. No JVD. Respiratory: CTA bilaterally. Normal effort. GI: BS +. Non-distended. Musc: No edema or tenderness in extremities. Neurological: He is alert and oriented.  Follows commands.   Fair awareness of deficits Motor: RUE/RLE: 5/5 proximal to distal LUE/LLE: 4+/5 proximal to distal Skin: Skin is warm and dry.  Psychiatric: He has a normal mood and affect. His behavior is normal.     Assessment & Plan:  57 year old right-handed male with history of hypertension, tobacco abuse presents for transitional care management after receiving CIR for right pontine infarct.   1. 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    Aspirin and Plavix x3 weeks then aspirin alone             Cont HEP, released from therapies  Discussed return to work, likely soon  2. Pain Management:   Improving  Trail wean Neurontin   3. Hypertension.    Elevated today  Labile per pt  Follow up with PCP, needs appointment  4. Tobacco as well as polysubstance abuse.    Cont abstinence  5. Gait abnormality  Cont cane for safety  AFO ill-fitting with pressure on heal, follow [facility] with [facility]  Per pt, no need for brace at home  Meds reviewed Referrals reviewed - needs PCP All questions answered

## 2018-09-04 ENCOUNTER — Ambulatory Visit: Payer: Self-pay | Admitting: Physician Assistant

## 2018-09-05 ENCOUNTER — Other Ambulatory Visit: Payer: Self-pay

## 2018-09-05 ENCOUNTER — Ambulatory Visit: Payer: Self-pay | Admitting: Physician Assistant

## 2018-09-05 NOTE — Patient Outreach (Signed)
[facility] Care Management  09/05/2018  Barry GaussLeroy T Ritter 08/17/1961 [id number]  EMMI: stroke red alert Referral date: 09/04/18 Referral reason: feeling worse overall: yes Day # 13  Telephone call to patient regarding EMMI stroke red alert. HIPAA verified with patient. Explained reason for call. Patient denies feeling worse.  Patient states he does have some trouble sleeping. Patient states he is scheduled to follow up with the doctor today. RNCM advised patient to talk with the doctor regarding his sleeping concerns. Patient denies any additional symptoms. Patient states he has transportation to his appointments. Patient reports he has his medications and takes them as prescribed.  RNCM discussed signs/ symptoms of stroke with patient. Advised patient that 911 should be called for stroke like symptoms . Marland Kitchen. Patient verbalized understanding.  RNCM advised patient to notify MD of any changes in condition prior to scheduled appointment. Patient denies any further needs / concerns at this time.   PLAN; RNCM will close patient due to patient being assessed and having no further needs.   George InaDavina Braiden Presutti RN,BSN,CCM [facility] Telephonic  [phone]

## 2018-09-11 ENCOUNTER — Telehealth: Payer: Self-pay | Admitting: Registered Nurse

## 2018-09-11 NOTE — Telephone Encounter (Signed)
This Provider received a message [name] the SW wanted to speak to the provider. Placed a call to [name] she stated [name] was approved for the [facility] program and wanted to know if we could prescribe his medication. Medication list was reviewed. Placed a call to Mrs. [name] she reports [name] has a scheduled appointment with PCP on 11/201/2019, she was instructed to call office afterwards, she verbalizes understanding. Hopefully PCP will be prescribing his medications, she verbalizes understanding.  [facility] Fax # [phone].  [name] seen Dr. Allena Ritter on 08/31/2018, not was reviewed.

## 2018-09-13 ENCOUNTER — Other Ambulatory Visit (HOSPITAL_COMMUNITY)
Admission: RE | Admit: 2018-09-13 | Discharge: 2018-09-13 | Disposition: A | Discharge status: Home / Self Care | Payer: Self-pay | Source: Ambulatory Visit | Attending: Physician Assistant | Admitting: Physician Assistant

## 2018-09-13 ENCOUNTER — Ambulatory Visit: Payer: Self-pay | Admitting: Physician Assistant

## 2018-09-13 ENCOUNTER — Encounter: Payer: Self-pay | Admitting: Physician Assistant

## 2018-09-13 VITALS — BP 130/78 | HR 89 | Temp 98.4°F | Ht 68.75 in | Wt 210.8 lb

## 2018-09-13 DIAGNOSIS — I10 Essential (primary) hypertension: Secondary | ICD-10-CM

## 2018-09-13 DIAGNOSIS — N400 Enlarged prostate without lower urinary tract symptoms: Secondary | ICD-10-CM

## 2018-09-13 DIAGNOSIS — F32A Depression, unspecified: Secondary | ICD-10-CM

## 2018-09-13 DIAGNOSIS — K219 Gastro-esophageal reflux disease without esophagitis: Secondary | ICD-10-CM

## 2018-09-13 DIAGNOSIS — Z125 Encounter for screening for malignant neoplasm of prostate: Secondary | ICD-10-CM | POA: Insufficient documentation

## 2018-09-13 DIAGNOSIS — I6350 Cerebral infarction due to unspecified occlusion or stenosis of unspecified cerebral artery: Secondary | ICD-10-CM

## 2018-09-13 DIAGNOSIS — Z7689 Persons encountering health services in other specified circumstances: Secondary | ICD-10-CM

## 2018-09-13 DIAGNOSIS — F329 Major depressive disorder, single episode, unspecified: Secondary | ICD-10-CM

## 2018-09-13 LAB — BASIC METABOLIC PANEL
Anion gap: 8 (ref 5–15)
BUN: 23 mg/dL — ABNORMAL HIGH (ref 6–20)
CO2: 25 mmol/L (ref 22–32)
Calcium: 8.8 mg/dL — ABNORMAL LOW (ref 8.9–10.3)
Chloride: 106 mmol/L (ref 98–111)
Creatinine, Ser: 0.92 mg/dL (ref 0.61–1.24)
GFR calc Af Amer: >60 mL/min (ref >60)
GFR calc non Af Amer: >60 mL/min (ref >60)
Glucose, Bld: 98 mg/dL (ref 70–99)
Potassium: 3.6 mmol/L (ref 3.5–5.1)
Sodium: 139 mmol/L (ref 135–145)

## 2018-09-13 LAB — PSA: Prostatic Specific Antigen: 0.46 ng/mL (ref 0.00–4.00)

## 2018-09-13 MED ORDER — AMLODIPINE BESYLATE 5 MG PO TABS: 5.0000 mg | ORAL_TABLET | Freq: Every day | ORAL | 3 refills | Status: DC

## 2018-09-13 MED ORDER — ATORVASTATIN CALCIUM 40 MG PO TABS: 40.0000 mg | ORAL_TABLET | Freq: Every day | ORAL | 1 refills | Status: DC

## 2018-09-13 MED ORDER — OMEPRAZOLE 20 MG PO CPDR: 20.0000 mg | DELAYED_RELEASE_CAPSULE | Freq: Two times a day (BID) | ORAL | 1 refills | Status: DC

## 2018-09-13 MED ORDER — CITALOPRAM HYDROBROMIDE 20 MG PO TABS: 20.0000 mg | ORAL_TABLET | Freq: Every day | ORAL | 0 refills | Status: DC

## 2018-09-13 MED ORDER — LISINOPRIL 10 MG PO TABS: 10.0000 mg | ORAL_TABLET | Freq: Every day | ORAL | 1 refills | Status: DC

## 2018-09-13 NOTE — Progress Notes (Signed)
BP 130/78 (BP Location: Left Arm, Patient Position: Sitting, Cuff Size: Normal)   Pulse 89   Temp 98.4 F (36.9 C)   Ht 5' 8.75" (1.746 m)   Wt 210 lb 12 oz (95.6 kg)   SpO2 98%   BMI 31.35 kg/m     Subjective:    Patient ID: [name], male    DOB: 12-Jan-1961, 57 y.o.   MRN: [id number]  HPI: [name] is a 57 y.o. male presenting on 09/13/2018 for New Patient (Initial Visit) (previous pt with Dr. Allena Katz last seen him 09/05/2018 and had f/u in 3 months.)   HPI   Pt was working as a Naval architect prior to CVA  Dr Allena Katz is a physiatrist (where pt was seen last week)- dr Allena Katz recommended pt  Return To Work soon  He has appointment with neurology next week (11/26)  Pt has history of htn, smoking, cva (October 2019-  R pontine infarct-  he received TPA. Discharged with recommendation for DAPT x3 wk then ASA alone- residual L sided weakness) .  Also dyslipidemia, bph, gerd, stage 2 CKD, polysubstance abuse (UDS + cocaine and MJ on recent admission)  Pt states anxiety and depression- pt and wife says it mostly started after his father passed away 3 years ago  Pt is still doing his PT at home.   Pt has letter stating he was approved for [facility]  Pt quit smoking and drug use after CVA.     Relevant past medical, surgical, family and social history reviewed and updated as indicated. Interim medical history since our last visit reviewed. Allergies and medications reviewed and updated.   Current Outpatient Medications:  .  acetaminophen (TYLENOL) 325 MG tablet, Take 2 tablets (650 mg total) by mouth every 4 (four) hours as needed for mild pain (or temp > 37.5 C (99.5 F))., Disp: , Rfl:  .  amLODipine-benazepril (LOTREL) 5-10 MG capsule, Take 1 capsule by mouth daily., Disp: 30 capsule, Rfl: 1 .  aspirin EC 81 MG EC tablet, Take 1 tablet (81 mg total) by mouth daily., Disp: , Rfl:  .  atorvastatin (LIPITOR) 40 MG tablet, Take 1 tablet (40 mg total) by mouth daily at 6 PM.,  Disp: 30 tablet, Rfl: 1 .  clopidogrel (PLAVIX) 75 MG tablet, Take 1 tablet (75 mg total) by mouth daily., Disp: 30 tablet, Rfl: 1 .  Melatonin 3 MG TABS, Take 1 tablet (3 mg total) by mouth at bedtime as needed (sleep)., Disp: 30 tablet, Rfl: 0 .  Multiple Vitamin (MULTIVITAMIN) capsule, Take 1 capsule by mouth daily. Gummies, Disp: , Rfl:  .  nicotine (NICODERM CQ - DOSED IN MG/24 HOURS) 21 mg/24hr patch, 21 mg patch daily x2 weeks then 14 mg patch daily x3 weeks then 7 mg patch daily x3 weeks and stop, Disp: 28 patch, Rfl: 0 .  tamsulosin (FLOMAX) 0.4 MG CAPS capsule, Take 1 capsule (0.4 mg total) by mouth at bedtime., Disp: 30 capsule, Rfl: 0 .  ALPRAZolam (XANAX) 0.25 MG tablet, Take by mouth. Take 1/2 tab po tid prn, Disp: , Rfl:  .  gabapentin (NEURONTIN) 300 MG capsule, Take 1 capsule (300 mg total) by mouth every 8 (eight) hours as needed (for leg cramps). (Patient not taking: Reported on 09/13/2018), Disp: 30 capsule, Rfl: 0 .  omeprazole (PRILOSEC OTC) 20 MG tablet, Take 1 tablet (20 mg total) by mouth daily. (Patient not taking: Reported on 09/13/2018), Disp: 30 tablet, Rfl: 1   Review  of Systems  Constitutional: Positive for fatigue. Negative for appetite change, chills, diaphoresis, fever and unexpected weight change.  HENT: Positive for congestion and dental problem. Negative for drooling, ear pain, facial swelling, hearing loss, mouth sores, sneezing, sore throat, trouble swallowing and voice change.   Eyes: Negative for pain, discharge, redness, itching and visual disturbance.  Respiratory: Positive for cough, shortness of breath and wheezing. Negative for choking.   Cardiovascular: Negative for chest pain, palpitations and leg swelling.  Gastrointestinal: Positive for abdominal pain. Negative for blood in stool, constipation, diarrhea and vomiting.  Endocrine: Positive for polydipsia. Negative for cold intolerance and heat intolerance.  Genitourinary: Negative for decreased urine  volume, dysuria and hematuria.  Musculoskeletal: Positive for arthralgias, back pain and gait problem.  Skin: Negative for rash.  Allergic/Immunologic: Positive for environmental allergies.  Neurological: Positive for headaches. Negative for seizures, syncope and light-headedness.  Hematological: Negative for adenopathy.  Psychiatric/Behavioral: Positive for agitation and dysphoric mood. Negative for suicidal ideas. The patient is nervous/anxious.     Per HPI unless specifically indicated above     Objective:    BP 130/78 (BP Location: Left Arm, Patient Position: Sitting, Cuff Size: Normal)   Pulse 89   Temp 98.4 F (36.9 C)   Ht 5' 8.75" (1.746 m)   Wt 210 lb 12 oz (95.6 kg)   SpO2 98%   BMI 31.35 kg/m    Wt Readings from Last 3 Encounters:  09/13/18 210 lb 12 oz (95.6 kg)  08/31/18 210 lb (95.3 kg)  08/14/18 199 lb 1.2 oz (90.3 kg)    Physical Exam  Constitutional: He is oriented to person, place, and time. He appears well-developed and well-nourished.  HENT:  Head: Normocephalic and atraumatic.  Mouth/Throat: Oropharynx is clear and moist. No oropharyngeal exudate.  Eyes: Pupils are equal, round, and reactive to light. Conjunctivae and EOM are normal.  Neck: Neck supple. No thyromegaly present.  Cardiovascular: Normal rate and regular rhythm.  Pulmonary/Chest: Effort normal and breath sounds normal. He has no wheezes. He has no rales.  Abdominal: Soft. Bowel sounds are normal. He exhibits no mass. There is no hepatosplenomegaly. There is no tenderness.  Musculoskeletal: He exhibits no edema.  Lymphadenopathy:    He has no cervical adenopathy.  Neurological: He is alert and oriented to person, place, and time. He has normal strength. No cranial nerve deficit or sensory deficit.  Pt walks with slight limp.  No other deficits noted  Skin: Skin is warm and dry. No rash noted.  Psychiatric: He has a normal mood and affect. His behavior is normal. Thought content normal.   Vitals reviewed.   Results for orders placed or performed during the hospital encounter of 08/14/18  CBC WITH DIFFERENTIAL  Result Value Ref Range   WBC 6.6 4.0 - 10.5 K/uL   RBC 4.28 4.22 - 5.81 MIL/uL   Hemoglobin 13.4 13.0 - 17.0 g/dL   HCT 16.140.1 09.639.0 - 04.552.0 %   MCV 93.7 80.0 - 100.0 fL   MCH 31.3 26.0 - 34.0 pg   MCHC 33.4 30.0 - 36.0 g/dL   RDW 40.913.5 81.111.5 - 91.415.5 %   Platelets 211 150 - 400 K/uL   nRBC 0.0 0.0 - 0.2 %   Neutrophils Relative % 63 %   Neutro Abs 4.2 1.7 - 7.7 K/uL   Lymphocytes Relative 26 %   Lymphs Abs 1.7 0.7 - 4.0 K/uL   Monocytes Relative 6 %   Monocytes Absolute 0.4 0.1 - 1.0 K/uL  Eosinophils Relative 4 %   Eosinophils Absolute 0.3 0.0 - 0.5 K/uL   Basophils Relative 1 %   Basophils Absolute 0.0 0.0 - 0.1 K/uL   Immature Granulocytes 0 %   Abs Immature Granulocytes 0.02 0.00 - 0.07 K/uL  Comprehensive metabolic panel  Result Value Ref Range   Sodium 136 135 - 145 mmol/L   Potassium 3.8 3.5 - 5.1 mmol/L   Chloride 108 98 - 111 mmol/L   CO2 20 (L) 22 - 32 mmol/L   Glucose, Bld 108 (H) 70 - 99 mg/dL   BUN 26 (H) 6 - 20 mg/dL   Creatinine, Ser 1.61 0.61 - 1.24 mg/dL   Calcium 8.8 (L) 8.9 - 10.3 mg/dL   Total Protein 6.4 (L) 6.5 - 8.1 g/dL   Albumin 3.5 3.5 - 5.0 g/dL   AST 24 15 - 41 U/L   ALT 29 0 - 44 U/L   Alkaline Phosphatase 57 38 - 126 U/L   Total Bilirubin 0.4 0.3 - 1.2 mg/dL   GFR calc non Af Amer >60 >60 mL/min   GFR calc Af Amer >60 >60 mL/min   Anion gap 8 5 - 15      Assessment & Plan:   Encounter Diagnoses  Name Primary?  . Encounter to establish care Yes  . Benign prostatic hyperplasia, unspecified whether lower urinary tract symptoms present   . Right pontine cerebrovascular accident [facility]) s/p tPA   . Essential hypertension   . Gastroesophageal reflux disease, esophagitis presence not specified   . Depression, unspecified depression type   . Screening for prostate cancer      -Pt is aware to finish plavix and when  bottle is empty he will go to ASA alone -rx Citalopram and pt [facility]ed to [facility]- discussed no xanax to be given -Pt says gabapentin doesn't help pain so will not renew -will Check PSA, recheck bmp -pt to continue with neurology and physiatry per their recommendations -no other changes to medications. (lotrel changed to amlodipine and lisinopril)  Rx sent to [facility] when available -encouraged pt to continue to stay off smoking.  -pt to follow up here 3 weeks to recheck mood.  RTO sooner prn

## 2018-09-13 NOTE — Patient Instructions (Signed)
Amlodipine and lisinopril are the two medicines that will replace the Lotrel (amlodipne/benazepril)

## 2018-09-18 ENCOUNTER — Other Ambulatory Visit: Payer: Self-pay | Admitting: Physician Assistant

## 2018-09-18 MED ORDER — TAMSULOSIN HCL 0.4 MG PO CAPS: 0.4000 mg | ORAL_CAPSULE | Freq: Every day | ORAL | 0 refills | Status: DC

## 2018-09-19 ENCOUNTER — Ambulatory Visit (INDEPENDENT_AMBULATORY_CARE_PROVIDER_SITE_OTHER): Payer: Self-pay | Admitting: Adult Health

## 2018-09-19 ENCOUNTER — Encounter: Payer: Self-pay | Admitting: Adult Health

## 2018-09-19 VITALS — BP 149/90 | HR 97 | Ht 68.75 in | Wt 215.4 lb

## 2018-09-19 DIAGNOSIS — I6350 Cerebral infarction due to unspecified occlusion or stenosis of unspecified cerebral artery: Secondary | ICD-10-CM

## 2018-09-19 DIAGNOSIS — I10 Essential (primary) hypertension: Secondary | ICD-10-CM

## 2018-09-19 DIAGNOSIS — E785 Hyperlipidemia, unspecified: Secondary | ICD-10-CM

## 2018-09-19 NOTE — Progress Notes (Signed)
[facility] [address] [place]. [place] [id number] [phone]       OFFICE FOLLOW UP NOTE  [name] Date of Birth:  11/16/1960 Medical Record Number:  [id number]   Reason for Referral:  hospital stroke follow up  CHIEF COMPLAINT:  Chief Complaint  Patient presents with  . 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 History obtained from patient, wife and chart review. Reviewed all radiology images and labs personally.  [name] a 57 y.o.malewith history of HTN and tobacco abusewho presented with L hemiparesis. CT head was negative for acute infarct therefore received IV tPA 08/11/2017 at 0056.  CTA head was negative for LVO but did show mild arthrosclerosis.  CTA neck showed extensive arthrosclerosis LVA with severe stenosis/near occlusion.  CT perfusion normal.  MRI brain reviewed and showed right pontine infarct.  2D echo showed an EF of 60 to 65%.  R pontine infarct d/t small vessel disease. Risk factors: HTN, tobacco abuse, cocaine use, THC use.  LDL 152 and A1c 5.6.  Patient initially found to be in hypertensive emergency which stabilized throughout admission and recommended long-term BP goal normotensive range.  Initiated atorvastatin for elevated LDL.  Despite UDS positive cocaine, per notes, patient denied cocaine use.  Current tobacco user with smoking cessation counseling provided.  Recommended DAPT for 3 weeks and aspirin alone.  Therapy recommends CIR for continued deficits and was discharged in stable condition.  Patient was discharged home with recommendations of outpatient therapies on 08/18/2018.  Patient is being seen today for hospital follow-up and is accompanied by wife. He feels as  though he is recovering well with only mild weakness. He continues to do home exercises that were provided to him after CIR admission. He has returned to all prior activities except for working. 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 He has completed 3 weeks DAPT and currently on aspirin alone without bleeding or bruising. He continues on lipitor without side effects of myalgias. Blood pressure today 130/91 which is normal level as he does monitor at home. He does endorse snoring, insomnia and morning headaches. He has been tested for sleep apnea and does have sleep apnea but he states he never obtained mask. [place] was [place] in [place] but wife is unsure of office name. He does continue to smoke approx 1 pack per day where prior he was smoking 3 packs per day.  He states he was not smoking while he was using the nicotine patch but he stopped using the patch recently and started smoking again.  He does plan on restarting use of the patch in order to completely quit smoking.  No further concerns at this time.  Denies new or worsening stroke/TIA symptoms.   ROS:   14 system review of systems performed and negative with exception of swelling in legs, shortness of breath, wheezing, snoring, increased thirst, joint pain, cramps, aching muscles, headache, depression, anxiety, none of sleep, sleepiness, snoring, and restless leg  PMH:  Past Medical History:  Diagnosis Date  . Acid reflux   . COPD (chronic obstructive pulmonary disease) (HCC)   . Depression   . Hypertension   . Kidney  stone   . Stroke [facility])     PSH:  Past Surgical History:  Procedure Laterality Date  . CHOLECYSTECTOMY      Social History:  Social History   Socioeconomic History  . Marital status: Married    Spouse name: Not on file  . Number of children: Not on file  . Years of education: Not on file  . Highest education level: Not on file  Occupational History  . Not on file  Social Needs  . Financial  resource strain: Not on file  . Food insecurity:    Worry: Not on file    Inability: Not on file  . Transportation needs:    Medical: Not on file    Non-medical: Not on file  Tobacco Use  . Smoking status: Current Every Day Smoker    Packs/day: 1.00    Years: 40.00    Pack years: 40.00    Types: Cigarettes  . Smokeless tobacco: Never Used  . Tobacco comment: pack a day was two packs before stroke  Substance and Sexual Activity  . Alcohol use: Not Currently    Alcohol/week: 0.0 standard drinks    Comment: none since 2004. previously heavy drinker  . Drug use: Not Currently    Types: Marijuana, Cocaine    Comment: last use 08/11/2018  . Sexual activity: Not on file  Lifestyle  . Physical activity:    Days per week: Not on file    Minutes per session: Not on file  . Stress: Not on file  Relationships  . Social connections:    Talks on phone: Not on file    Gets together: Not on file    Attends religious service: Not on file    Active member of club or organization: Not on file    Attends meetings of clubs or organizations: Not on file    Relationship status: Not on file  . Intimate partner violence:    Fear of current or ex partner: Not on file    Emotionally abused: Not on file    Physically abused: Not on file    Forced sexual activity: Not on file  Other Topics Concern  . Not on file  Social History Narrative  . Not on file    Family History:  Family History  Problem Relation Age of Onset  . Epilepsy Mother   . Hypertension Father   . Heart disease Father   . Hyperlipidemia Father   . Diabetes Father     Medications:   Current Outpatient Medications on File Prior to Visit  Medication Sig Dispense Refill  . acetaminophen (TYLENOL) 325 MG tablet Take 2 tablets (650 mg total) by mouth every 4 (four) hours as needed for mild pain (or temp > 37.5 C (99.5 F)).    Marland Kitchen amLODipine (NORVASC) 5 MG tablet Take 1 tablet (5 mg total) by mouth daily. 90 tablet 3  .  amLODipine-benazepril (LOTREL) 5-10 MG capsule Take 1 capsule by mouth daily. 30 capsule 1  . aspirin EC 81 MG EC tablet Take 1 tablet (81 mg total) by mouth daily.    Marland Kitchen atorvastatin (LIPITOR) 40 MG tablet Take 1 tablet (40 mg total) by mouth daily at 6 PM. 90 tablet 1  . citalopram (CELEXA) 20 MG tablet Take 1 tablet (20 mg total) by mouth daily. 30 tablet 0  . clopidogrel (PLAVIX) 75 MG tablet Take 1 tablet (75 mg total) by mouth daily. 30 tablet 1  . lisinopril (PRINIVIL,ZESTRIL) 10 MG  tablet Take 1 tablet (10 mg total) by mouth daily. 90 tablet 1  . Melatonin 3 MG TABS Take 1 tablet (3 mg total) by mouth at bedtime as needed (sleep). 30 tablet 0  . Multiple Vitamin (MULTIVITAMIN) capsule Take 1 capsule by mouth daily. Gummies    . omeprazole (PRILOSEC OTC) 20 MG tablet Take 1 tablet (20 mg total) by mouth daily. 30 tablet 1  . omeprazole (PRILOSEC) 20 MG capsule Take 1 capsule (20 mg total) by mouth 2 (two) times daily before a meal. 180 capsule 1  . tamsulosin (FLOMAX) 0.4 MG CAPS capsule Take 1 capsule (0.4 mg total) by mouth at bedtime. 30 capsule 0  . nicotine (NICODERM CQ - DOSED IN MG/24 HOURS) 21 mg/24hr patch 21 mg patch daily x2 weeks then 14 mg patch daily x3 weeks then 7 mg patch daily x3 weeks and stop (Patient not taking: Reported on 09/19/2018) 28 patch 0   No current facility-administered medications on file prior to visit.     Allergies:   Allergies  Allergen Reactions  . Morphine And Related     Altered mental status  . Penicillins Hives    Has patient had a PCN reaction causing immediate rash, facial/tongue/throat swelling, SOB or lightheadedness with hypotension: Yes Has patient had a PCN reaction causing severe rash involving mucus membranes or skin necrosis: No Has patient had a PCN reaction that required hospitalization: No Has patient had a PCN reaction occurring within the last 10 years: No If all of the above answers are "NO", then may proceed with Cephalosporin  use.      Physical Exam  Vitals:   09/19/18 1425  BP: (!) 149/90  Pulse: 97  Weight: 215 lb 6.4 oz (97.7 kg)  Height: 5' 8.75" (1.746 m)   Body mass index is 32.04 kg/m. No exam data present  General: well developed, well nourished, pleasant middle-aged Caucasian male, seated, in no evident distress Head: head normocephalic and atraumatic.   Neck: supple with no carotid or supraclavicular bruits Cardiovascular: regular rate and rhythm, no murmurs Musculoskeletal: no deformity Skin:  no rash/petichiae Vascular:  Normal pulses all extremities  Neurologic Exam Mental Status: Awake and fully alert. Oriented to place and time. Recent and remote memory intact. Attention span, concentration and fund of knowledge appropriate. Mood and affect appropriate.  Cranial Nerves: Fundoscopic exam reveals sharp disc margins. Pupils equal, briskly reactive to light. Extraocular movements full without nystagmus. Visual fields full to confrontation. Hearing intact. Facial sensation intact. Face, tongue, palate moves normally and symmetrically.  Motor: Normal bulk and tone. Normal strength in all tested extremity muscles. Sensory.: intact to touch , pinprick , position and vibratory sensation.  Coordination: Rapid alternating movements normal in all extremities. Finger-to-nose and heel-to-shin performed accurately bilaterally. Gait and Station: Arises from chair without difficulty. Stance is normal. Gait demonstrates normal stride length and balance.  Reflexes: 1+ and symmetric. Toes downgoing.    NIHSS  0 Modified Rankin  0    Diagnostic Data (Labs, Imaging, Testing)  CT HEAD WO CONTRAST 08/11/2018 IMPRESSION: 1. No acute abnormality of the brain identified. 2. Asymmetric mildly increased density in right terminal ICA may reflect thrombus. 3. ASPECTS is 10  CT ANGIO HEAD W OR WO CONTRAST CT ANGIO NECK W OR WO CONTRAST CT CEREBRAL PERFUSION W CONTRAST 08/11/2018 IMPRESSION: CTA  neck:  1. Patent bilateral carotid systems and right vertebral artery without hemodynamically significant stenosis by NASCET criteria, dissection, or aneurysm. 2. 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  CTA head:  1. No large vessel occlusion, high-grade stenosis, or aneurysm. 2. Mild atherosclerosis of carotid siphons and the basilar artery without significant stenosis.  CT brain perfusion:  Normal CT brain perfusion.  MR BRAIN WO CONTRAST 08/11/2018 IMPRESSION: 1. Acute pontine infarct. 2. Mild chronic small vessel ischemic disease.  ECHOCARDIOGRAM 08/11/2018 Impressions: - LVEF 60-65%, normal wall thickness, normal wall motion, grade 1   DD, indeterminate LV filling pressure, trivial MR, normal IVC.    ASSESSMENT: [name] is a 57 y.o. year old male here with right pontine infarct on 08/12/2018 secondary to small vessel disease. Vascular risk factors include HTN, HLD, cocaine use, tobacco use and THC.  Patient is being seen today for hospital follow-up and overall has recovered well without residual deficit.    PLAN:  1. Right pontine infarct: Continue aspirin 81 mg daily  and atorvastatin for secondary stroke prevention. Maintain strict control of hypertension with blood pressure goal below 130/90, diabetes with hemoglobin A1c goal below 6.5% and cholesterol with LDL cholesterol (bad cholesterol) goal below 70 mg/dL.  I also advised the patient to eat a healthy diet with plenty of whole grains, cereals, fruits and vegetables, exercise regularly with at least 30 minutes of continuous activity daily and maintain ideal body weight. 2. HTN: Advised to continue current treatment regimen.  Today's BP 149/90.  Advised to continue to monitor at home along with continued follow-up with PCP for  management 3. HLD: Advised to continue current treatment regimen along with continued follow-up with PCP for future prescribing and monitoring of lipid panel 4. Hx of OSA: Highly encouraged patient and wife call office where sleep apnea testing was obtained in order to obtain CPAP machine for OSA management 5. Tobacco use: Highly encouraged patient restarting use of nicotine patches to assist with completely quitting smoking as it greatly reduces risk for recurrent stroke 6. Patient was cleared to return to work at this time with light duty for the first week and then after that he can return full-time without restrictions.  Patient was provided with work release note.    Follow up in 6 months or call earlier if needed   Greater than 50% of time during this 25 minute visit was spent on counseling, explanation of diagnosis of right pontine infarct, reviewing risk factor management of HTN, HLD and tobacco use, planning of further management along with potential future management, and discussion with patient and family answering all questions.    George Hugh, AGNP-BC  [facility] [address] [place], [place] [address]  Phone [phone] Fax [phone] Note: This document was prepared with digital dictation and possible smart phrase technology. Any transcriptional errors that result from this process are unintentional.

## 2018-09-19 NOTE — Progress Notes (Signed)
I agree with the above plan 

## 2018-09-19 NOTE — Patient Instructions (Addendum)
Continue aspirin 81 mg daily  and lipitor  for secondary stroke prevention  Try CoQ10 200mg  daily for possible muscle aches and pains - if does not resolve after the next month or worsens, please call office and we can consider changing statin use  Continue to follow up with PCP regarding cholesteorl and blood pressure management   You are cleared to return to work. Recommend for the first week, you limit excessive ambulating and work mainly driving the truck as increased ambulation can cause your symptoms to be present. Also recommend increase break periods if you start to become more fatigued which may happen within the next month. After the first week, if everything is going well, you can return full time without restrictions  NO MORE SMOKING!!! Start using those patches again and try your hardest to go back to not smoking as this greatly reduces your chance of another stroke  [place] CPAP machine  Continue to monitor blood pressure at home  Maintain strict control of hypertension with blood pressure goal below 130/90, diabetes with hemoglobin A1c goal below 6.5% and cholesterol with LDL cholesterol (bad cholesterol) goal below 70 mg/dL. I also advised the patient to eat a healthy diet with plenty of whole grains, cereals, fruits and vegetables, exercise regularly and maintain ideal body weight.  Followup in the future with me in 6 months or call earlier if needed       Thank you for coming [facility]. I hope we have been able to provide you high quality care today.  You may receive a patient satisfaction survey over the next few weeks. We would appreciate your feedback and comments so that we may continue to improve ourselves and the health of our patients.

## 2018-10-16 ENCOUNTER — Other Ambulatory Visit: Payer: Self-pay | Admitting: Physician Assistant

## 2018-10-19 ENCOUNTER — Other Ambulatory Visit: Payer: Self-pay | Admitting: Physician Assistant

## 2018-10-19 MED ORDER — CITALOPRAM HYDROBROMIDE 20 MG PO TABS: 20.0000 mg | ORAL_TABLET | Freq: Every day | ORAL | 0 refills | Status: DC

## 2018-10-31 ENCOUNTER — Ambulatory Visit: Payer: Self-pay | Admitting: Physician Assistant

## 2018-10-31 ENCOUNTER — Other Ambulatory Visit: Payer: Self-pay

## 2018-10-31 NOTE — Patient Outreach (Signed)
Telephone outreach to patient to obtain mRs was successfully completed. mRs= 3. Spoke with wife per DPR to obtain score.

## 2018-11-14 ENCOUNTER — Ambulatory Visit: Payer: Self-pay | Admitting: Physician Assistant

## 2018-11-15 DIAGNOSIS — Z0271 Encounter for disability determination: Secondary | ICD-10-CM

## 2018-11-22 ENCOUNTER — Ambulatory Visit: Payer: Self-pay | Admitting: Physician Assistant

## 2018-11-22 ENCOUNTER — Encounter: Payer: Self-pay | Admitting: Physician Assistant

## 2018-11-22 VITALS — BP 162/87 | HR 85 | Temp 97.9°F | Ht 68.75 in | Wt 209.2 lb

## 2018-11-22 DIAGNOSIS — I10 Essential (primary) hypertension: Secondary | ICD-10-CM

## 2018-11-22 DIAGNOSIS — F17200 Nicotine dependence, unspecified, uncomplicated: Secondary | ICD-10-CM

## 2018-11-22 DIAGNOSIS — E785 Hyperlipidemia, unspecified: Secondary | ICD-10-CM

## 2018-11-22 DIAGNOSIS — F32A Depression, unspecified: Secondary | ICD-10-CM

## 2018-11-22 DIAGNOSIS — F329 Major depressive disorder, single episode, unspecified: Secondary | ICD-10-CM

## 2018-11-22 NOTE — Progress Notes (Signed)
BP (!) 162/87 (BP Location: Right Arm, Patient Position: Sitting, Cuff Size: Normal)   Pulse 85   Temp 97.9 F (36.6 C)   Ht 5' 8.75" (1.746 m)   Wt 209 lb 4 oz (94.9 kg)   SpO2 98%   BMI 31.13 kg/m    Subjective:    Patient ID: [name], male    DOB: July 20, 1961, 58 y.o.   MRN: [id number]  HPI: [name] is a 58 y.o. male presenting on 11/22/2018 for Follow-up   HPI  Pt saw neurologidt in November and was released to return to work  Pt did not go to [employer] as recommended.  He thinks his depression is worse.   Pt did not bring his meds today and he has no idea what he is taking.   He restarted smoking.   He says he is not smoking an entire pack/day again yet but is smoking.   No drugs or alcohol except he smokes MJ about 1 / week.   Relevant past medical, surgical, family and social history reviewed and updated as indicated. Interim medical history since our last visit reviewed. Allergies and medications reviewed and updated.  CURRENT MEDS: unknown  Review of Systems  Constitutional: Positive for fatigue. Negative for appetite change, chills, diaphoresis, fever and unexpected weight change.  HENT: Positive for congestion. Negative for dental problem, drooling, ear pain, facial swelling, hearing loss, mouth sores, sneezing, sore throat, trouble swallowing and voice change.   Eyes: Negative for pain, discharge, redness, itching and visual disturbance.  Respiratory: Positive for cough, shortness of breath and wheezing. Negative for choking.   Cardiovascular: Negative for chest pain, palpitations and leg swelling.  Gastrointestinal: Negative for abdominal pain, blood in stool, constipation, diarrhea and vomiting.  Endocrine: Positive for polydipsia. Negative for cold intolerance and heat intolerance.  Genitourinary: Negative for decreased urine volume, dysuria and hematuria.  Musculoskeletal: Positive for arthralgias, back pain and gait problem.  Skin: Negative for  rash.  Allergic/Immunologic: Negative for environmental allergies.  Neurological: Positive for headaches. Negative for seizures, syncope and light-headedness.  Hematological: Negative for adenopathy.  Psychiatric/Behavioral: Positive for agitation and dysphoric mood. Negative for suicidal ideas. The patient is nervous/anxious.     Per HPI unless specifically indicated above     Objective:    BP (!) 162/87 (BP Location: Right Arm, Patient Position: Sitting, Cuff Size: Normal)   Pulse 85   Temp 97.9 F (36.6 C)   Ht 5' 8.75" (1.746 m)   Wt 209 lb 4 oz (94.9 kg)   SpO2 98%   BMI 31.13 kg/m   Wt Readings from Last 3 Encounters:  11/22/18 209 lb 4 oz (94.9 kg)  09/19/18 215 lb 6.4 oz (97.7 kg)  09/13/18 210 lb 12 oz (95.6 kg)    Physical Exam Vitals signs reviewed.  Constitutional:      Appearance: He is well-developed.  HENT:     Head: Normocephalic and atraumatic.  Neck:     Musculoskeletal: Neck supple.  Cardiovascular:     Rate and Rhythm: Normal rate and regular rhythm.  Pulmonary:     Effort: Pulmonary effort is normal.     Breath sounds: Normal breath sounds. No wheezing.  Abdominal:     General: Bowel sounds are normal.     Palpations: Abdomen is soft.     Tenderness: There is no abdominal tenderness.  Lymphadenopathy:     Cervical: No cervical adenopathy.  Skin:    General: Skin is warm and  dry.  Neurological:     Mental Status: He is alert and oriented to person, place, and time.  Psychiatric:        Behavior: Behavior normal.     Results for orders placed or performed during the hospital encounter of 09/13/18  Basic metabolic panel  Result Value Ref Range   Sodium 139 135 - 145 mmol/L   Potassium 3.6 3.5 - 5.1 mmol/L   Chloride 106 98 - 111 mmol/L   CO2 25 22 - 32 mmol/L   Glucose, Bld 98 70 - 99 mg/dL   BUN 23 (H) 6 - 20 mg/dL   Creatinine, Ser 5.80 0.61 - 1.24 mg/dL   Calcium 8.8 (L) 8.9 - 10.3 mg/dL   GFR calc non Af Amer >60 >60 mL/min   GFR  calc Af Amer >60 >60 mL/min   Anion gap 8 5 - 15  PSA  Result Value Ref Range   Prostatic Specific Antigen 0.46 0.00 - 4.00 ng/mL      Assessment & Plan:   Encounter Diagnoses  Name Primary?  . Essential hypertension Yes  . Hyperlipidemia, unspecified hyperlipidemia type   . Depression, unspecified depression type   . Tobacco use disorder      -Reviewed labs with pt -pt now due for lipid check so he is to go get more labs drawn -counseled smoking cessation and marijuana avoidance -pt counseled to bring all meds to every appointment -pt urged to go to [facility] for help with his depression -will have pt follow up next week WITH HIS MEDS since adjustment needed but unable to be done today

## 2018-11-29 ENCOUNTER — Ambulatory Visit: Payer: Self-pay | Admitting: Physician Assistant

## 2018-12-01 ENCOUNTER — Encounter: Payer: Self-pay | Attending: Physical Medicine & Rehabilitation | Admitting: Physical Medicine & Rehabilitation

## 2018-12-01 DIAGNOSIS — F419 Anxiety disorder, unspecified: Secondary | ICD-10-CM | POA: Insufficient documentation

## 2018-12-01 DIAGNOSIS — Z7902 Long term (current) use of antithrombotics/antiplatelets: Secondary | ICD-10-CM | POA: Insufficient documentation

## 2018-12-01 DIAGNOSIS — F1910 Other psychoactive substance abuse, uncomplicated: Secondary | ICD-10-CM | POA: Insufficient documentation

## 2018-12-01 DIAGNOSIS — Z7982 Long term (current) use of aspirin: Secondary | ICD-10-CM | POA: Insufficient documentation

## 2018-12-01 DIAGNOSIS — Z8249 Family history of ischemic heart disease and other diseases of the circulatory system: Secondary | ICD-10-CM | POA: Insufficient documentation

## 2018-12-01 DIAGNOSIS — R531 Weakness: Secondary | ICD-10-CM | POA: Insufficient documentation

## 2018-12-01 DIAGNOSIS — I10 Essential (primary) hypertension: Secondary | ICD-10-CM | POA: Insufficient documentation

## 2018-12-01 DIAGNOSIS — R269 Unspecified abnormalities of gait and mobility: Secondary | ICD-10-CM | POA: Insufficient documentation

## 2018-12-01 DIAGNOSIS — Z87891 Personal history of nicotine dependence: Secondary | ICD-10-CM | POA: Insufficient documentation

## 2018-12-01 DIAGNOSIS — I6350 Cerebral infarction due to unspecified occlusion or stenosis of unspecified cerebral artery: Secondary | ICD-10-CM | POA: Insufficient documentation

## 2018-12-26 ENCOUNTER — Encounter: Payer: Self-pay | Admitting: Physician Assistant

## 2018-12-26 ENCOUNTER — Ambulatory Visit: Payer: Self-pay | Admitting: Physician Assistant

## 2018-12-26 ENCOUNTER — Other Ambulatory Visit: Payer: Self-pay

## 2018-12-26 ENCOUNTER — Other Ambulatory Visit (HOSPITAL_COMMUNITY)
Admission: RE | Admit: 2018-12-26 | Discharge: 2018-12-26 | Disposition: A | Discharge status: Home / Self Care | Payer: Self-pay | Source: Ambulatory Visit | Attending: Physician Assistant | Admitting: Physician Assistant

## 2018-12-26 VITALS — BP 150/86 | HR 95 | Temp 98.1°F | Ht 68.75 in | Wt 211.2 lb

## 2018-12-26 DIAGNOSIS — I10 Essential (primary) hypertension: Secondary | ICD-10-CM

## 2018-12-26 DIAGNOSIS — F329 Major depressive disorder, single episode, unspecified: Secondary | ICD-10-CM

## 2018-12-26 DIAGNOSIS — E785 Hyperlipidemia, unspecified: Secondary | ICD-10-CM

## 2018-12-26 DIAGNOSIS — K219 Gastro-esophageal reflux disease without esophagitis: Secondary | ICD-10-CM

## 2018-12-26 DIAGNOSIS — R0602 Shortness of breath: Secondary | ICD-10-CM

## 2018-12-26 DIAGNOSIS — F17200 Nicotine dependence, unspecified, uncomplicated: Secondary | ICD-10-CM

## 2018-12-26 DIAGNOSIS — F32A Depression, unspecified: Secondary | ICD-10-CM

## 2018-12-26 LAB — LIPID PANEL
Cholesterol: 144 mg/dL (ref 0–200)
HDL: 32 mg/dL — ABNORMAL LOW (ref >40)
LDL Cholesterol: 88 mg/dL (ref 0–99)
Total CHOL/HDL Ratio: 4.5 RATIO
Triglycerides: 121 mg/dL (ref <150)
VLDL: 24 mg/dL (ref 0–40)

## 2018-12-26 LAB — COMPREHENSIVE METABOLIC PANEL
ALT: 17 U/L (ref 0–44)
AST: 16 U/L (ref 15–41)
Albumin: 3.9 g/dL (ref 3.5–5.0)
Alkaline Phosphatase: 66 U/L (ref 38–126)
Anion gap: 8 (ref 5–15)
BUN: 24 mg/dL — ABNORMAL HIGH (ref 6–20)
CO2: 25 mmol/L (ref 22–32)
Calcium: 8.7 mg/dL — ABNORMAL LOW (ref 8.9–10.3)
Chloride: 104 mmol/L (ref 98–111)
Creatinine, Ser: 0.91 mg/dL (ref 0.61–1.24)
GFR calc Af Amer: >60 mL/min (ref >60)
GFR calc non Af Amer: >60 mL/min (ref >60)
Glucose, Bld: 108 mg/dL — ABNORMAL HIGH (ref 70–99)
Potassium: 4.4 mmol/L (ref 3.5–5.1)
Sodium: 137 mmol/L (ref 135–145)
Total Bilirubin: 0.3 mg/dL (ref 0.3–1.2)
Total Protein: 7.2 g/dL (ref 6.5–8.1)

## 2018-12-26 MED ORDER — IPRATROPIUM-ALBUTEROL 0.5-2.5 (3) MG/3ML IN SOLN
3.0000 mL | Freq: Once | RESPIRATORY_TRACT | Status: AC
  Administered 2018-12-26: 3 mL via RESPIRATORY_TRACT

## 2018-12-26 MED ORDER — ALBUTEROL SULFATE HFA 108 (90 BASE) MCG/ACT IN AERS: 2.0000 | INHALATION_SPRAY | Freq: Four times a day (QID) | RESPIRATORY_TRACT | 0 refills | Status: DC | PRN

## 2018-12-26 MED ORDER — CITALOPRAM HYDROBROMIDE 20 MG PO TABS: 20.0000 mg | ORAL_TABLET | Freq: Every day | ORAL | 0 refills | Status: DC

## 2018-12-26 NOTE — Progress Notes (Signed)
BP (!) 150/86 (BP Location: Left Arm, Patient Position: Sitting, Cuff Size: Normal)   Pulse 95   Temp 98.1 F (36.7 C)   Ht 5' 8.75" (1.746 m)   Wt 211 lb 4 oz (95.8 kg)   SpO2 97%   BMI 31.42 kg/m    Subjective:    Patient ID: [name], male    DOB: Mar 10, 1961, 58 y.o.   MRN: [id number]  HPI: [name] is a 58 y.o. male presenting on 12/26/2018 for Follow-up   HPI   Pt is still not going to [facility] for Depression as recommended.  Pt is not taking the citalopram prescribed for him at previous OV for anxiety and depression   He is still smoking.   Pt is taking dayquil and nyquil for congestion.    Pt says he is feeling tired but is otherwise feeling well.    Relevant past medical, surgical, family and social history reviewed and updated as indicated. Interim medical history since our last visit reviewed. Allergies and medications reviewed and updated.   Current Outpatient Medications:  .  amLODipine (NORVASC) 5 MG tablet, Take 1 tablet (5 mg total) by mouth daily., Disp: 90 tablet, Rfl: 3 .  aspirin EC 81 MG EC tablet, Take 1 tablet (81 mg total) by mouth daily., Disp: , Rfl:  .  atorvastatin (LIPITOR) 40 MG tablet, Take 1 tablet (40 mg total) by mouth daily at 6 PM., Disp: 90 tablet, Rfl: 1 .  lisinopril (PRINIVIL,ZESTRIL) 10 MG tablet, Take 1 tablet (10 mg total) by mouth daily., Disp: 90 tablet, Rfl: 1 .  Melatonin 3 MG TABS, Take 1 tablet (3 mg total) by mouth at bedtime as needed (sleep). (Patient taking differently: Take 5 mg by mouth at bedtime as needed (sleep). ), Disp: 30 tablet, Rfl: 0 .  Multiple Vitamin (MULTIVITAMIN) capsule, Take 1 capsule by mouth daily. Gummies, Disp: , Rfl:  .  omeprazole (PRILOSEC) 20 MG capsule, Take 1 capsule (20 mg total) by mouth 2 (two) times daily before a meal., Disp: 180 capsule, Rfl: 1 .  citalopram (CELEXA) 20 MG tablet, Take 1 tablet (20 mg total) by mouth daily. (Patient not taking: Reported on 11/22/2018), Disp: 30  tablet, Rfl: 0 .  nicotine (NICODERM CQ - DOSED IN MG/24 HOURS) 21 mg/24hr patch, 21 mg patch daily x2 weeks then 14 mg patch daily x3 weeks then 7 mg patch daily x3 weeks and stop (Patient not taking: Reported on 09/19/2018), Disp: 28 patch, Rfl: 0 .  omeprazole (PRILOSEC OTC) 20 MG tablet, Take 1 tablet (20 mg total) by mouth daily. (Patient not taking: Reported on 12/26/2018), Disp: 30 tablet, Rfl: 1 .  tamsulosin (FLOMAX) 0.4 MG CAPS capsule, Take 1 capsule (0.4 mg total) by mouth at bedtime. (Patient not taking: Reported on 11/22/2018), Disp: 30 capsule, Rfl: 0    Review of Systems  Per HPI unless specifically indicated above     Objective:    BP (!) 150/86 (BP Location: Left Arm, Patient Position: Sitting, Cuff Size: Normal)   Pulse 95   Temp 98.1 F (36.7 C)   Ht 5' 8.75" (1.746 m)   Wt 211 lb 4 oz (95.8 kg)   SpO2 97%   BMI 31.42 kg/m   Wt Readings from Last 3 Encounters:  12/26/18 211 lb 4 oz (95.8 kg)  11/22/18 209 lb 4 oz (94.9 kg)  09/19/18 215 lb 6.4 oz (97.7 kg)    Physical Exam Vitals signs reviewed.  Constitutional:  Appearance: He is well-developed.  HENT:     Head: Normocephalic and atraumatic.  Neck:     Musculoskeletal: Neck supple.  Cardiovascular:     Rate and Rhythm: Normal rate and regular rhythm.  Pulmonary:     Effort: Pulmonary effort is normal.     Breath sounds: Normal breath sounds. No wheezing.  Abdominal:     General: Bowel sounds are normal.     Palpations: Abdomen is soft.     Tenderness: There is no abdominal tenderness.  Lymphadenopathy:     Cervical: No cervical adenopathy.  Skin:    General: Skin is warm and dry.  Neurological:     Mental Status: He is alert and oriented to person, place, and time.  Psychiatric:        Behavior: Behavior normal.     Results for orders placed or performed during the hospital encounter of 12/26/18  Lipid panel  Result Value Ref Range   Cholesterol 144 0 - 200 mg/dL   Triglycerides 742  <595 mg/dL   HDL 32 (L) >63 mg/dL   Total CHOL/HDL Ratio 4.5 RATIO   VLDL 24 0 - 40 mg/dL   LDL Cholesterol 88 0 - 99 mg/dL  Comprehensive metabolic panel  Result Value Ref Range   Sodium 137 135 - 145 mmol/L   Potassium 4.4 3.5 - 5.1 mmol/L   Chloride 104 98 - 111 mmol/L   CO2 25 22 - 32 mmol/L   Glucose, Bld 108 (H) 70 - 99 mg/dL   BUN 24 (H) 6 - 20 mg/dL   Creatinine, Ser 8.75 0.61 - 1.24 mg/dL   Calcium 8.7 (L) 8.9 - 10.3 mg/dL   Total Protein 7.2 6.5 - 8.1 g/dL   Albumin 3.9 3.5 - 5.0 g/dL   AST 16 15 - 41 U/L   ALT 17 0 - 44 U/L   Alkaline Phosphatase 66 38 - 126 U/L   Total Bilirubin 0.3 0.3 - 1.2 mg/dL   GFR calc non Af Amer >60 >60 mL/min   GFR calc Af Amer >60 >60 mL/min   Anion gap 8 5 - 15      Assessment & Plan:   Encounter Diagnoses  Name Primary?  . Essential hypertension Yes  . SOB (shortness of breath)   . Hyperlipidemia, unspecified hyperlipidemia type   . Tobacco use disorder   . Depression, unspecified depression type   . Gastroesophageal reflux disease, esophagitis presence not specified     -reviewed labs with pt -ordered albuterol inhaler for pt -Counseled smoking cessation -Counseled pt to go to [facility] for counseling -Encouraged him to start his citalopram -Counseled pt to avoid decongestants due to HTN.  Will not increase blood pressure medication due to the decongestants he has been taking but instead will recheck in 1 month -pt to follow up  1 month- RTO sooner prn

## 2019-01-16 ENCOUNTER — Ambulatory Visit: Payer: Self-pay | Admitting: Physician Assistant

## 2019-02-06 ENCOUNTER — Ambulatory Visit: Payer: Self-pay | Admitting: Physician Assistant

## 2019-03-05 ENCOUNTER — Other Ambulatory Visit: Payer: Self-pay | Admitting: Physician Assistant

## 2019-03-05 DIAGNOSIS — I10 Essential (primary) hypertension: Secondary | ICD-10-CM

## 2019-03-05 DIAGNOSIS — E785 Hyperlipidemia, unspecified: Secondary | ICD-10-CM

## 2019-03-06 ENCOUNTER — Other Ambulatory Visit: Payer: Self-pay | Admitting: Physician Assistant

## 2019-03-12 ENCOUNTER — Telehealth: Payer: Self-pay

## 2019-03-12 NOTE — Telephone Encounter (Signed)
If pt calls back please schedule him with Dt. SEthi this week for a video visit. Shanda Bumps NP will not be in the office next week. His appt will be cancel on Mar 20, 2019. Due to COVID 19 we are doing video visit. Need verbal consent from pt to do video and to file insurance.  LEft vm for pt stating appt needs to be r/s due to provider being out of office.

## 2019-03-20 ENCOUNTER — Ambulatory Visit: Payer: MEDICAID | Admitting: Adult Health

## 2019-03-26 ENCOUNTER — Other Ambulatory Visit: Payer: Self-pay | Admitting: Physician Assistant

## 2019-04-02 ENCOUNTER — Other Ambulatory Visit (HOSPITAL_COMMUNITY)
Admission: RE | Admit: 2019-04-02 | Discharge: 2019-04-02 | Disposition: A | Discharge status: Home / Self Care | Payer: Self-pay | Source: Ambulatory Visit | Attending: Physician Assistant | Admitting: Physician Assistant

## 2019-04-02 ENCOUNTER — Telehealth: Payer: Self-pay | Admitting: *Deleted

## 2019-04-02 ENCOUNTER — Ambulatory Visit: Payer: Self-pay | Admitting: Physician Assistant

## 2019-04-02 ENCOUNTER — Encounter: Payer: Self-pay | Admitting: Physician Assistant

## 2019-04-02 ENCOUNTER — Other Ambulatory Visit: Payer: Self-pay | Admitting: Internal Medicine

## 2019-04-02 ENCOUNTER — Other Ambulatory Visit: Payer: Self-pay

## 2019-04-02 VITALS — Temp 98.1°F

## 2019-04-02 DIAGNOSIS — Z20822 Contact with and (suspected) exposure to covid-19: Secondary | ICD-10-CM

## 2019-04-02 DIAGNOSIS — R5383 Other fatigue: Secondary | ICD-10-CM

## 2019-04-02 DIAGNOSIS — R112 Nausea with vomiting, unspecified: Secondary | ICD-10-CM

## 2019-04-02 DIAGNOSIS — M791 Myalgia, unspecified site: Secondary | ICD-10-CM

## 2019-04-02 DIAGNOSIS — E785 Hyperlipidemia, unspecified: Secondary | ICD-10-CM | POA: Insufficient documentation

## 2019-04-02 DIAGNOSIS — I10 Essential (primary) hypertension: Secondary | ICD-10-CM | POA: Insufficient documentation

## 2019-04-02 LAB — COMPREHENSIVE METABOLIC PANEL
ALT: 19 U/L (ref 0–44)
AST: 22 U/L (ref 15–41)
Albumin: 4.5 g/dL (ref 3.5–5.0)
Alkaline Phosphatase: 77 U/L (ref 38–126)
Anion gap: 11 (ref 5–15)
BUN: 36 mg/dL — ABNORMAL HIGH (ref 6–20)
CO2: 26 mmol/L (ref 22–32)
Calcium: 9.2 mg/dL (ref 8.9–10.3)
Chloride: 101 mmol/L (ref 98–111)
Creatinine, Ser: 1.08 mg/dL (ref 0.61–1.24)
GFR calc Af Amer: >60 mL/min (ref >60)
GFR calc non Af Amer: >60 mL/min (ref >60)
Glucose, Bld: 116 mg/dL — ABNORMAL HIGH (ref 70–99)
Potassium: 3.4 mmol/L — ABNORMAL LOW (ref 3.5–5.1)
Sodium: 138 mmol/L (ref 135–145)
Total Bilirubin: 0.9 mg/dL (ref 0.3–1.2)
Total Protein: 8 g/dL (ref 6.5–8.1)

## 2019-04-02 LAB — LIPID PANEL
Cholesterol: 141 mg/dL (ref 0–200)
HDL: 30 mg/dL — ABNORMAL LOW (ref >40)
LDL Cholesterol: 82 mg/dL (ref 0–99)
Total CHOL/HDL Ratio: 4.7 RATIO
Triglycerides: 145 mg/dL (ref <150)
VLDL: 29 mg/dL (ref 0–40)

## 2019-04-02 MED ORDER — PROMETHAZINE HCL 25 MG/ML IJ SOLN
25.0000 mg | Freq: Once | INTRAMUSCULAR | Status: AC
  Administered 2019-04-02: 25 mg via INTRAMUSCULAR

## 2019-04-02 MED ORDER — PROMETHAZINE HCL 25 MG PO TABS: 25.0000 mg | ORAL_TABLET | Freq: Three times a day (TID) | ORAL | 0 refills | Status: DC | PRN

## 2019-04-02 NOTE — Patient Instructions (Signed)
Nausea and Vomiting, Adult  Nausea is feeling sick to your stomach or feeling that you are about to throw up (vomit). Vomiting is when food in your stomach is thrown up and out of the mouth. Throwing up can make you feel weak. It can also make you lose too much water in your body (get dehydrated). If you lose too much water in your body, you may:  · Feel tired.  · Feel thirsty.  · Have a dry mouth.  · Have cracked lips.  · Go pee (urinate) less often.  Older adults and people with other diseases or a weak body defense system (immune system) are at higher risk for losing too much water in the body. If you feel sick to your stomach and you throw up, it is important to follow instructions from your doctor about how to take care of yourself.  Follow these instructions at home:  Watch your symptoms for any changes. Tell your doctor about them. Follow these instructions to care for yourself at home.  Eating and drinking         · Take an ORS (oral rehydration solution). This is a drink that is sold at pharmacies and stores.  · Drink clear fluids in small amounts as you are able, such as:  ? Water.  ? Ice chips.  ? Fruit juice that has water added (diluted fruit juice).  ? Low-calorie sports drinks.  · Eat bland, easy-to-digest foods in small amounts as you are able, such as:  ? Bananas.  ? Applesauce.  ? Rice.  ? Low-fat (lean) meats.  ? Toast.  ? Crackers.  · Avoid drinking fluids that have a lot of sugar or caffeine in them. This includes energy drinks, sports drinks, and soda.  · Avoid alcohol.  · Avoid spicy or fatty foods.  General instructions  · Take over-the-counter and prescription medicines only as told by your doctor.  · Drink enough fluid to keep your pee (urine) pale yellow.  · Wash your hands often with soap and water. If you cannot use soap and water, use hand sanitizer.  · Make sure that all people in your home wash their hands well and often.  · Rest at home while you get better.  · Watch your condition  for any changes.  · Take slow and deep breaths when you feel sick to your stomach.  · Keep all follow-up visits as told by your doctor. This is important.  Contact a doctor if:  · Your symptoms get worse.  · You have new symptoms.  · You have a fever.  · You cannot drink fluids without throwing up.  · You feel sick to your stomach for more than 2 days.  · You feel light-headed or dizzy.  · You have a headache.  · You have muscle cramps.  · You have a rash.  · You have pain while peeing.  Get help right away if:  · You have pain in your chest, neck, arm, or jaw.  · You feel very weak or you pass out (faint).  · You throw up again and again.  · You have throw up that is bright red or looks like black coffee grounds.  · You have bloody or black poop (stools) or poop that looks like tar.  · You have a very bad headache, a stiff neck, or both.  · You have very bad pain, cramping, or bloating in your belly (abdomen).  · You have trouble   breathing.  · You are breathing very quickly.  · Your heart is beating very quickly.  · Your skin feels cold and clammy.  · You feel confused.  · You have signs of losing too much water in your body, such as:  ? Dark pee, very little pee, or no pee.  ? Cracked lips.  ? Dry mouth.  ? Sunken eyes.  ? Sleepiness.  ? Weakness.  These symptoms may be an emergency. Do not wait to see if the symptoms will go away. Get medical help right away. Call your local emergency services (911 in the [place] Do not drive yourself to the hospital.  Summary  · Nausea is feeling sick to your stomach or feeling that you are about to throw up (vomit). Vomiting is when food in your stomach is thrown up and out of the mouth.  · Follow instructions from your doctor about eating and drinking to keep from losing too much water in your body.  · Take over-the-counter and prescription medicines only as told by your doctor.  · Contact your doctor if your symptoms get worse or you have new symptoms.  · Keep all follow-up  visits as told by your doctor. This is important.  This information is not intended to replace advice given to you by your health care provider. Make sure you discuss any questions you have with your health care provider.  Document Released: 03/29/2008 Document Revised: 03/21/2018 Document Reviewed: 03/21/2018  Elsevier Interactive Patient Education © 2019 [employer] Inc[employer]

## 2019-04-02 NOTE — Telephone Encounter (Signed)
Received call from Soyla Dryer, [place] requesting covid testing for pt. Pt with nausea, vomiting fatigue and body aches. Pt scheduled for today at [facility] site. Testing process reviewed by Ms. McElroy.  Clinics Ph # [phone]  Pts Ph# [phone]

## 2019-04-02 NOTE — Progress Notes (Signed)
There were no vitals taken for this visit.   Subjective:    Patient ID: [name], male    DOB: 05/18/1961, 58 y.o.   MRN: [id number]  HPI: Barry GaussLeroy T Hyslop is a 58 y.o. male presenting on 04/02/2019 for No chief complaint on file.   HPI   Pt came to office today for his regularly scheduled follow up but screening elicited that pt is sick so he had a telemedicine through [employer] using an ipad while he sat in his car.    I connected with  Barry GaussLeroy T Beddow on 04/02/19 by a video enabled telemedicine application and verified that I am speaking with the correct person using two identifiers.   I discussed the limitations of evaluation and management by telemedicine. The patient expressed understanding and agreed to proceed.  Pt is in his car (he is passenger).  Provider is in office/clinic  Pt says he started feeling badly and started vomiting on Friday.   No diarrhea.  No fevers.  He says he is very tired and aches all over.   He says he has been Unable to take his meds since friday  He says he is having abd pain due to throwing up.  He says abd pain started after the emesis and eases up when he isn't vomiting.    Pt works- Aeronautical engineerlandscaping.    Pt [place].    Relevant past medical, surgical, family and social history reviewed and updated as indicated. Interim medical history since our last visit reviewed. Allergies and medications reviewed and updated.   Current Outpatient Medications:  .  amLODipine (NORVASC) 5 MG tablet, Take 1 tablet (5 mg total) by mouth daily., Disp: 90 tablet, Rfl: 3 .  aspirin EC 81 MG EC tablet, Take 1 tablet (81 mg total) by mouth daily., Disp: , Rfl:  .  atorvastatin (LIPITOR) 40 MG tablet, TAKE 1 Tablet BY MOUTH ONCE DAILY AT 6 PM, Disp: 90 tablet, Rfl: 1 .  citalopram (CELEXA) 20 MG tablet, Take 1 tablet by mouth once daily, Disp: 30 tablet, Rfl: 0 .  lisinopril (ZESTRIL) 10 MG tablet, TAKE 1 Tablet BY MOUTH  ONCE DAILY, Disp: 90 tablet, Rfl: 1 .  Melatonin 3 MG TABS, Take 1 tablet (3 mg total) by mouth at bedtime as needed (sleep). (Patient taking differently: Take 5 mg by mouth at bedtime as needed (sleep). ), Disp: 30 tablet, Rfl: 0 .  Multiple Vitamin (MULTIVITAMIN) capsule, Take 1 capsule by mouth daily. Gummies, Disp: , Rfl:  .  nicotine (NICODERM CQ - DOSED IN MG/24 HOURS) 21 mg/24hr patch, 21 mg patch daily x2 weeks then 14 mg patch daily x3 weeks then 7 mg patch daily x3 weeks and stop (Patient not taking: Reported on 09/19/2018), Disp: 28 patch, Rfl: 0 .  omeprazole (PRILOSEC OTC) 20 MG tablet, Take 1 tablet (20 mg total) by mouth daily. (Patient not taking: Reported on 12/26/2018), Disp: 30 tablet, Rfl: 1 .  omeprazole (PRILOSEC) 20 MG capsule, TAKE 1 Capsule  BY MOUTH TWICE DAILY BEFORE MEALS, Disp: 180 capsule, Rfl: 1 .  PROVENTIL HFA 108 (90 Base) MCG/ACT inhaler, INHALE 2 PUFFS BY MOUTH EVERY 6 HOURS AS NEEDED FOR COUGHING, WHEEZING, OR SHORTNESS OF BREATH, Disp: 20.1 g, Rfl: 0 .  tamsulosin (FLOMAX) 0.4 MG CAPS capsule, Take 1 capsule (0.4 mg total) by mouth at bedtime. (Patient not taking: Reported on 11/22/2018), Disp: 30 capsule, Rfl: 0  Review of Systems  Per HPI unless specifically indicated above     Objective:    There were no vitals taken for this visit.  Wt Readings from Last 3 Encounters:  12/26/18 211 lb 4 oz (95.8 kg)  11/22/18 209 lb 4 oz (94.9 kg)  09/19/18 215 lb 6.4 oz (97.7 kg)    Physical Exam Constitutional:      General: He is not in acute distress.    Appearance: He is ill-appearing. He is not toxic-appearing.  HENT:     Head: Normocephalic and atraumatic.  Pulmonary:     Effort: Pulmonary effort is normal. No respiratory distress.  Neurological:     Mental Status: He is alert and oriented to person, place, and time.  Psychiatric:        Attention and Perception: Attention normal.        Mood and Affect: Mood normal.        Speech: Speech normal.         Behavior: Behavior is cooperative.        Cognition and Memory: Cognition normal.     Results for orders placed or performed during the hospital encounter of 04/02/19  Lipid panel  Result Value Ref Range   Cholesterol 141 0 - 200 mg/dL   Triglycerides 145 <150 mg/dL   HDL 30 (L) >40 mg/dL   Total CHOL/HDL Ratio 4.7 RATIO   VLDL 29 0 - 40 mg/dL   LDL Cholesterol 82 0 - 99 mg/dL  Comprehensive metabolic panel  Result Value Ref Range   Sodium 138 135 - 145 mmol/L   Potassium 3.4 (L) 3.5 - 5.1 mmol/L   Chloride 101 98 - 111 mmol/L   CO2 26 22 - 32 mmol/L   Glucose, Bld 116 (H) 70 - 99 mg/dL   BUN 36 (H) 6 - 20 mg/dL   Creatinine, Ser 1.08 0.61 - 1.24 mg/dL   Calcium 9.2 8.9 - 10.3 mg/dL   Total Protein 8.0 6.5 - 8.1 g/dL   Albumin 4.5 3.5 - 5.0 g/dL   AST 22 15 - 41 U/L   ALT 19 0 - 44 U/L   Alkaline Phosphatase 77 38 - 126 U/L   Total Bilirubin 0.9 0.3 - 1.2 mg/dL   GFR calc non Af Amer >60 >60 mL/min   GFR calc Af Amer >60 >60 mL/min   Anion gap 11 5 - 15      Assessment & Plan:    Encounter Diagnoses  Name Primary?  . Non-intractable vomiting with nausea, unspecified vomiting type Yes  . Suspected 2019 Novel Coronavirus Infection   . Fatigue, unspecified type   . Myalgia     -Pt is given phenergan IM (his wife is driving) and rx phenergan.  He is counseled on avoiding dehydration with frequent sips of water.  He is given reading information.  As his symptoms ease, he can gradually advance diet.  -will send pt for CV-19 testing.  He is reminded to self-isolate until he gets negative test results.   -pt is given note to be out of work until next Monday -pt is to call office next week to reschedule routine appointment.  He is to contact office sooner for any new symptoms.  He is told to go to ER for SOB or CP or confusion

## 2019-04-06 LAB — NOVEL CORONAVIRUS, NAA: SARS-CoV-2, NAA: NOT DETECTED

## 2019-04-10 ENCOUNTER — Encounter: Payer: Self-pay | Admitting: Physician Assistant

## 2019-04-11 ENCOUNTER — Ambulatory Visit: Payer: Self-pay | Admitting: Physician Assistant

## 2019-04-11 ENCOUNTER — Encounter: Payer: Self-pay | Admitting: Physician Assistant

## 2019-04-11 DIAGNOSIS — F17200 Nicotine dependence, unspecified, uncomplicated: Secondary | ICD-10-CM

## 2019-04-11 DIAGNOSIS — E785 Hyperlipidemia, unspecified: Secondary | ICD-10-CM

## 2019-04-11 DIAGNOSIS — J449 Chronic obstructive pulmonary disease, unspecified: Secondary | ICD-10-CM | POA: Insufficient documentation

## 2019-04-11 DIAGNOSIS — I10 Essential (primary) hypertension: Secondary | ICD-10-CM

## 2019-04-11 DIAGNOSIS — Z125 Encounter for screening for malignant neoplasm of prostate: Secondary | ICD-10-CM

## 2019-04-11 NOTE — Progress Notes (Signed)
There were no vitals taken for this visit.   Subjective:    Patient ID: [name], male    DOB: 05/05/1961, 58 y.o.   MRN: [id number]  HPI: [name] is a 58 y.o. male presenting on 04/11/2019 for No chief complaint on file.   HPI   This is a telemedicine appointment through [employer] due to coronavirus pandemic.    I connected with  [name] on 04/11/19 by a video enabled telemedicine application and verified that I am speaking with the correct person using two identifiers.   I discussed the limitations of evaluation and management by telemedicine. The patient expressed understanding and agreed to proceed.  Pt is at home.  Provider is at office  Pt had GI illness which he was seen for on 04/02/19.  He hasn't gone back to work-  [employer] want letter stating CV19 test was negative  Pt says he is feeling well.   He Says he isn't 100% his usual but that he is eating and no longer having emesis or diarrhea.   Relevant past medical, surgical, family and social history reviewed and updated as indicated. Interim medical history since our last visit reviewed. Allergies and medications reviewed and updated.    Current Outpatient Medications:  .  amLODipine (NORVASC) 5 MG tablet, Take 1 tablet (5 mg total) by mouth daily., Disp: 90 tablet, Rfl: 3 .  aspirin EC 81 MG EC tablet, Take 1 tablet (81 mg total) by mouth daily., Disp: , Rfl:  .  atorvastatin (LIPITOR) 40 MG tablet, TAKE 1 Tablet BY MOUTH ONCE DAILY AT 6 PM, Disp: 90 tablet, Rfl: 1 .  citalopram (CELEXA) 20 MG tablet, Take 1 tablet by mouth once daily, Disp: 30 tablet, Rfl: 0 .  lisinopril (ZESTRIL) 10 MG tablet, TAKE 1 Tablet BY MOUTH ONCE DAILY, Disp: 90 tablet, Rfl: 1 .  Melatonin 3 MG TABS, Take 1 tablet (3 mg total) by mouth at bedtime as needed (sleep). (Patient taking differently: Take 5 mg by mouth at bedtime as needed (sleep). ), Disp: 30 tablet, Rfl: 0 .  omeprazole (PRILOSEC) 20 MG capsule, TAKE 1 Capsule  BY MOUTH  TWICE DAILY BEFORE MEALS, Disp: 180 capsule, Rfl: 1 .  PROVENTIL HFA 108 (90 Base) MCG/ACT inhaler, INHALE 2 PUFFS BY MOUTH EVERY 6 HOURS AS NEEDED FOR COUGHING, WHEEZING, OR SHORTNESS OF BREATH, Disp: 20.1 g, Rfl: 0 .  tamsulosin (FLOMAX) 0.4 MG CAPS capsule, Take 1 capsule (0.4 mg total) by mouth at bedtime. (Patient not taking: Reported on 11/22/2018), Disp: 30 capsule, Rfl: 0   Review of Systems  Per HPI unless specifically indicated above     Objective:    There were no vitals taken for this visit.  Wt Readings from Last 3 Encounters:  12/26/18 211 lb 4 oz (95.8 kg)  11/22/18 209 lb 4 oz (94.9 kg)  09/19/18 215 lb 6.4 oz (97.7 kg)    Physical Exam Constitutional:      General: He is not in acute distress.    Appearance: He is not ill-appearing.  HENT:     Head: Normocephalic and atraumatic.  Pulmonary:     Effort: Pulmonary effort is normal. No respiratory distress.  Neurological:     Mental Status: He is alert and oriented to person, place, and time.  Psychiatric:        Attention and Perception: Attention normal.        Mood and Affect: Mood normal.  Speech: Speech normal.        Behavior: Behavior normal. Behavior is cooperative.        Cognition and Memory: Cognition normal.     Results for orders placed or performed during the hospital encounter of 04/02/19  Lipid panel  Result Value Ref Range   Cholesterol 141 0 - 200 mg/dL   Triglycerides 145 <150 mg/dL   HDL 30 (L) >40 mg/dL   Total CHOL/HDL Ratio 4.7 RATIO   VLDL 29 0 - 40 mg/dL   LDL Cholesterol 82 0 - 99 mg/dL  Comprehensive metabolic panel  Result Value Ref Range   Sodium 138 135 - 145 mmol/L   Potassium 3.4 (L) 3.5 - 5.1 mmol/L   Chloride 101 98 - 111 mmol/L   CO2 26 22 - 32 mmol/L   Glucose, Bld 116 (H) 70 - 99 mg/dL   BUN 36 (H) 6 - 20 mg/dL   Creatinine, Ser 1.08 0.61 - 1.24 mg/dL   Calcium 9.2 8.9 - 10.3 mg/dL   Total Protein 8.0 6.5 - 8.1 g/dL   Albumin 4.5 3.5 - 5.0 g/dL   AST 22  15 - 41 U/L   ALT 19 0 - 44 U/L   Alkaline Phosphatase 77 38 - 126 U/L   Total Bilirubin 0.9 0.3 - 1.2 mg/dL   GFR calc non Af Amer >60 >60 mL/min   GFR calc Af Amer >60 >60 mL/min   Anion gap 11 5 - 15      Assessment & Plan:   Encounter Diagnoses  Name Primary?  . Essential hypertension Yes  . Hyperlipidemia, unspecified hyperlipidemia type   . Chronic obstructive pulmonary disease, unspecified COPD type ([place])   . Tobacco use disorder      -reviewed labs with pt  -pt to continue current medications -encouraged pt to wear a mask in public per CDC guidelines -per patient request, will fax letter to his employer stating that his coronavirus test done 04/05/19 was negative -pt to follow up 3 months.  He is to contact office sooner prn

## 2019-04-23 ENCOUNTER — Telehealth: Payer: Self-pay | Admitting: Physician Assistant

## 2019-05-09 ENCOUNTER — Other Ambulatory Visit: Payer: Self-pay | Admitting: Physician Assistant

## 2019-05-15 ENCOUNTER — Encounter: Payer: Self-pay | Admitting: Physician Assistant

## 2019-05-15 NOTE — Progress Notes (Signed)
Pt's Negative COVID-19 results from 04-02-19 was faxed to: [employer] at fax number [phone] on 04-11-19. This was faxed to patient's employer.  See [facility] office note on 04-11-19

## 2019-06-18 ENCOUNTER — Other Ambulatory Visit: Payer: Self-pay | Admitting: Physician Assistant

## 2019-08-05 ENCOUNTER — Other Ambulatory Visit: Payer: Self-pay | Admitting: Physician Assistant

## 2019-08-06 ENCOUNTER — Ambulatory Visit: Payer: Self-pay | Admitting: Physician Assistant

## 2019-08-13 ENCOUNTER — Other Ambulatory Visit: Payer: Self-pay | Admitting: Physician Assistant

## 2019-08-13 ENCOUNTER — Ambulatory Visit: Payer: Self-pay | Admitting: Physician Assistant

## 2019-08-13 ENCOUNTER — Other Ambulatory Visit: Payer: Self-pay

## 2019-08-13 ENCOUNTER — Other Ambulatory Visit (HOSPITAL_COMMUNITY)
Admission: RE | Admit: 2019-08-13 | Discharge: 2019-08-13 | Disposition: A | Discharge status: Home / Self Care | Payer: Self-pay | Source: Ambulatory Visit | Attending: Physician Assistant | Admitting: Physician Assistant

## 2019-08-13 ENCOUNTER — Encounter: Payer: Self-pay | Admitting: Physician Assistant

## 2019-08-13 VITALS — BP 142/82 | HR 94 | Temp 99.1°F

## 2019-08-13 DIAGNOSIS — Z1211 Encounter for screening for malignant neoplasm of colon: Secondary | ICD-10-CM

## 2019-08-13 DIAGNOSIS — J449 Chronic obstructive pulmonary disease, unspecified: Secondary | ICD-10-CM

## 2019-08-13 DIAGNOSIS — F329 Major depressive disorder, single episode, unspecified: Secondary | ICD-10-CM

## 2019-08-13 DIAGNOSIS — I10 Essential (primary) hypertension: Secondary | ICD-10-CM

## 2019-08-13 DIAGNOSIS — E785 Hyperlipidemia, unspecified: Secondary | ICD-10-CM | POA: Insufficient documentation

## 2019-08-13 DIAGNOSIS — Z8673 Personal history of transient ischemic attack (TIA), and cerebral infarction without residual deficits: Secondary | ICD-10-CM

## 2019-08-13 DIAGNOSIS — F17200 Nicotine dependence, unspecified, uncomplicated: Secondary | ICD-10-CM

## 2019-08-13 DIAGNOSIS — F32A Depression, unspecified: Secondary | ICD-10-CM

## 2019-08-13 DIAGNOSIS — Z125 Encounter for screening for malignant neoplasm of prostate: Secondary | ICD-10-CM | POA: Insufficient documentation

## 2019-08-13 LAB — COMPREHENSIVE METABOLIC PANEL
ALT: 19 U/L (ref 0–44)
AST: 18 U/L (ref 15–41)
Albumin: 3.9 g/dL (ref 3.5–5.0)
Alkaline Phosphatase: 70 U/L (ref 38–126)
Anion gap: 9 (ref 5–15)
BUN: 13 mg/dL (ref 6–20)
CO2: 26 mmol/L (ref 22–32)
Calcium: 9 mg/dL (ref 8.9–10.3)
Chloride: 102 mmol/L (ref 98–111)
Creatinine, Ser: 0.94 mg/dL (ref 0.61–1.24)
GFR calc Af Amer: >60 mL/min (ref >60)
GFR calc non Af Amer: >60 mL/min (ref >60)
Glucose, Bld: 115 mg/dL — ABNORMAL HIGH (ref 70–99)
Potassium: 3.6 mmol/L (ref 3.5–5.1)
Sodium: 137 mmol/L (ref 135–145)
Total Bilirubin: 0.2 mg/dL — ABNORMAL LOW (ref 0.3–1.2)
Total Protein: 7.1 g/dL (ref 6.5–8.1)

## 2019-08-13 LAB — PSA: Prostatic Specific Antigen: 0.55 ng/mL (ref 0.00–4.00)

## 2019-08-13 LAB — LIPID PANEL
Cholesterol: 153 mg/dL (ref 0–200)
HDL: 28 mg/dL — ABNORMAL LOW (ref >40)
LDL Cholesterol: 89 mg/dL (ref 0–99)
Total CHOL/HDL Ratio: 5.5 RATIO
Triglycerides: 180 mg/dL — ABNORMAL HIGH (ref <150)
VLDL: 36 mg/dL (ref 0–40)

## 2019-08-13 MED ORDER — LISINOPRIL 20 MG PO TABS: 20.0000 mg | ORAL_TABLET | Freq: Every day | ORAL | 1 refills | Status: DC

## 2019-08-13 NOTE — Progress Notes (Signed)
BP 140/80   Pulse (!) 106   Temp 99.1 F (37.3 C)   SpO2 96%    Subjective:    Patient ID: [name], male    DOB: [birth year], 58 y.o.   MRN: [id number]  HPI: [name] is a 58 y.o. male presenting on 08/13/2019 for Hyperlipidemia and Hypertension   HPI    Pt had a negative covid 19 screening questionnaire   Pt starts new job at the [employer] starting tomorrow  He still has some depression.  He is taking citalopram but never called [facility] for counseling as recommended.  He denies SI, HI.  He says the citalopram has seemed to improve things somewhat for him.    He sometimes wears a mask and sometimes not when asked about his actions to prevent covid transmission  He denies CP, SOB.    Relevant past medical, surgical, family and social history reviewed and updated as indicated. Interim medical history since our last visit reviewed. Allergies and medications reviewed and updated.   Current Outpatient Medications:  .  amLODipine (NORVASC) 5 MG tablet, Take 1 tablet (5 mg total) by mouth daily., Disp: 90 tablet, Rfl: 3 .  aspirin EC 81 MG EC tablet, Take 1 tablet (81 mg total) by mouth daily., Disp: , Rfl:  .  atorvastatin (LIPITOR) 40 MG tablet, TAKE 1 Tablet BY MOUTH ONCE DAILY AT 6 PM, Disp: 90 tablet, Rfl: 1 .  citalopram (CELEXA) 20 MG tablet, Take 1 tablet by mouth once daily, Disp: 30 tablet, Rfl: 0 .  lisinopril (ZESTRIL) 10 MG tablet, TAKE 1 Tablet BY MOUTH ONCE DAILY, Disp: 90 tablet, Rfl: 1 .  Melatonin 3 MG TABS, Take 1 tablet (3 mg total) by mouth at bedtime as needed (sleep). (Patient taking differently: Take 5 mg by mouth at bedtime as needed (sleep). ), Disp: 30 tablet, Rfl: 0 .  omeprazole (PRILOSEC) 20 MG capsule, TAKE 1 Capsule  BY MOUTH TWICE DAILY BEFORE MEALS, Disp: 180 capsule, Rfl: 1 .  PROVENTIL HFA 108 (90 Base) MCG/ACT inhaler, INHALE 2 PUFFS BY MOUTH EVERY 6 HOURS AS NEEDED FOR COUGHING, WHEEZING, OR SHORTNESS OF BREATH, Disp: 20.1 g, Rfl: 0 .   tamsulosin (FLOMAX) 0.4 MG CAPS capsule, Take 1 capsule (0.4 mg total) by mouth at bedtime. (Patient not taking: Reported on 11/22/2018), Disp: 30 capsule, Rfl: 0   Review of Systems  Per HPI unless specifically indicated above     Objective:    BP 140/80   Pulse (!) 106   Temp 99.1 F (37.3 C)   SpO2 96%   Wt Readings from Last 3 Encounters:  12/26/18 211 lb 4 oz (95.8 kg)  11/22/18 209 lb 4 oz (94.9 kg)  09/19/18 215 lb 6.4 oz (97.7 kg)    Physical Exam Vitals signs reviewed.  Constitutional:      General: He is not in acute distress.    Appearance: He is well-developed. He is not ill-appearing.  HENT:     Head: Normocephalic and atraumatic.  Neck:     Musculoskeletal: Neck supple.  Cardiovascular:     Rate and Rhythm: Normal rate and regular rhythm.  Pulmonary:     Effort: Pulmonary effort is normal.     Breath sounds: Normal breath sounds. No wheezing.  Abdominal:     General: Bowel sounds are normal.     Palpations: Abdomen is soft.     Tenderness: There is no abdominal tenderness.  Musculoskeletal:     Right  lower leg: No edema.     Left lower leg: No edema.  Lymphadenopathy:     Cervical: No cervical adenopathy.  Skin:    General: Skin is warm and dry.  Neurological:     Mental Status: He is alert and oriented to person, place, and time.  Psychiatric:        Attention and Perception: Attention normal.        Speech: Speech normal.        Behavior: Behavior normal. Behavior is cooperative.     Results for orders placed or performed during the hospital encounter of 08/13/19  Lipid panel  Result Value Ref Range   Cholesterol 153 0 - 200 mg/dL   Triglycerides 180 (H) <150 mg/dL   HDL 28 (L) >40 mg/dL   Total CHOL/HDL Ratio 5.5 RATIO   VLDL 36 0 - 40 mg/dL   LDL Cholesterol 89 0 - 99 mg/dL  Comprehensive metabolic panel  Result Value Ref Range   Sodium 137 135 - 145 mmol/L   Potassium 3.6 3.5 - 5.1 mmol/L   Chloride 102 98 - 111 mmol/L   CO2 26 22  - 32 mmol/L   Glucose, Bld 115 (H) 70 - 99 mg/dL   BUN 13 6 - 20 mg/dL   Creatinine, Ser 0.94 0.61 - 1.24 mg/dL   Calcium 9.0 8.9 - 10.3 mg/dL   Total Protein 7.1 6.5 - 8.1 g/dL   Albumin 3.9 3.5 - 5.0 g/dL   AST 18 15 - 41 U/L   ALT 19 0 - 44 U/L   Alkaline Phosphatase 70 38 - 126 U/L   Total Bilirubin 0.2 (L) 0.3 - 1.2 mg/dL   GFR calc non Af Amer >60 >60 mL/min   GFR calc Af Amer >60 >60 mL/min   Anion gap 9 5 - 15      Assessment & Plan:    Encounter Diagnoses  Name Primary?  . Essential hypertension Yes  . Hyperlipidemia, unspecified hyperlipidemia type   . Chronic obstructive pulmonary disease, unspecified COPD type ([place])   . Tobacco use disorder   . Depression, unspecified depression type   . Screening for colon cancer      -Reviewed labs with pt -will Schedule influenza immunization -Will call pt with results PSA as it is still pending at time of appointment -pt is given ifobt for colon cancer screening -will Increase the lisinopril for his BP.  He is to continue his other medications -encouraged pt to contact [facility] for [facility] -counseled smoking cessation -encouraged pt to wear a mask every time he goes out to reduce risk of covid -pt to follow up 3 months.  He is to contact office sooner prn

## 2019-08-16 ENCOUNTER — Encounter: Payer: Self-pay | Admitting: Student

## 2019-08-20 ENCOUNTER — Ambulatory Visit: Payer: Self-pay | Admitting: Physician Assistant

## 2019-08-21 LAB — IFOBT (OCCULT BLOOD): IFOBT: NEGATIVE

## 2019-09-03 ENCOUNTER — Encounter: Payer: Self-pay | Admitting: Student

## 2019-09-03 ENCOUNTER — Other Ambulatory Visit: Payer: Self-pay | Admitting: Physician Assistant

## 2019-09-12 ENCOUNTER — Other Ambulatory Visit: Payer: Self-pay | Admitting: Physician Assistant

## 2019-09-14 ENCOUNTER — Other Ambulatory Visit: Payer: Self-pay | Admitting: Physician Assistant

## 2019-09-14 MED ORDER — ATORVASTATIN CALCIUM 40 MG PO TABS: ORAL_TABLET | ORAL | 1 refills | Status: DC

## 2019-09-14 MED ORDER — LISINOPRIL 20 MG PO TABS: 20.0000 mg | ORAL_TABLET | Freq: Every day | ORAL | 1 refills | Status: DC

## 2019-09-14 MED ORDER — AMLODIPINE BESYLATE 5 MG PO TABS: 5.0000 mg | ORAL_TABLET | Freq: Every day | ORAL | 1 refills | Status: DC

## 2019-09-14 MED ORDER — ALBUTEROL SULFATE HFA 108 (90 BASE) MCG/ACT IN AERS: INHALATION_SPRAY | RESPIRATORY_TRACT | 0 refills | Status: DC

## 2019-09-14 MED ORDER — OMEPRAZOLE 20 MG PO CPDR: DELAYED_RELEASE_CAPSULE | ORAL | 1 refills | Status: DC

## 2019-09-17 ENCOUNTER — Other Ambulatory Visit: Payer: Self-pay | Admitting: Physician Assistant

## 2019-09-17 MED ORDER — CITALOPRAM HYDROBROMIDE 20 MG PO TABS: 20.0000 mg | ORAL_TABLET | Freq: Every day | ORAL | 3 refills | Status: DC

## 2019-09-17 MED ORDER — ATORVASTATIN CALCIUM 40 MG PO TABS: ORAL_TABLET | ORAL | 3 refills | Status: DC

## 2019-09-17 MED ORDER — AMLODIPINE BESYLATE 5 MG PO TABS: 5.0000 mg | ORAL_TABLET | Freq: Every day | ORAL | 4 refills | Status: DC

## 2019-09-17 MED ORDER — OMEPRAZOLE 20 MG PO CPDR: DELAYED_RELEASE_CAPSULE | ORAL | 3 refills | Status: DC

## 2019-09-17 MED ORDER — LISINOPRIL 20 MG PO TABS: 20.0000 mg | ORAL_TABLET | Freq: Every day | ORAL | 3 refills | Status: DC

## 2019-09-17 MED ORDER — ALBUTEROL SULFATE HFA 108 (90 BASE) MCG/ACT IN AERS: INHALATION_SPRAY | RESPIRATORY_TRACT | 0 refills | Status: DC

## 2019-11-12 ENCOUNTER — Ambulatory Visit: Payer: Self-pay | Admitting: Physician Assistant

## 2019-11-15 ENCOUNTER — Other Ambulatory Visit (HOSPITAL_COMMUNITY)
Admission: RE | Admit: 2019-11-15 | Discharge: 2019-11-15 | Disposition: A | Discharge status: Home / Self Care | Payer: Self-pay | Source: Ambulatory Visit | Attending: Physician Assistant | Admitting: Physician Assistant

## 2019-11-15 ENCOUNTER — Other Ambulatory Visit: Payer: Self-pay

## 2019-11-15 ENCOUNTER — Ambulatory Visit: Payer: Self-pay | Admitting: Physician Assistant

## 2019-11-15 ENCOUNTER — Encounter: Payer: Self-pay | Admitting: Physician Assistant

## 2019-11-15 VITALS — BP 136/80 | HR 87 | Temp 97.3°F | Wt 210.2 lb

## 2019-11-15 DIAGNOSIS — Z91199 Patient's noncompliance with other medical treatment and regimen due to unspecified reason: Secondary | ICD-10-CM

## 2019-11-15 DIAGNOSIS — J449 Chronic obstructive pulmonary disease, unspecified: Secondary | ICD-10-CM

## 2019-11-15 DIAGNOSIS — I10 Essential (primary) hypertension: Secondary | ICD-10-CM

## 2019-11-15 DIAGNOSIS — E785 Hyperlipidemia, unspecified: Secondary | ICD-10-CM | POA: Insufficient documentation

## 2019-11-15 DIAGNOSIS — F17200 Nicotine dependence, unspecified, uncomplicated: Secondary | ICD-10-CM

## 2019-11-15 DIAGNOSIS — Z9119 Patient's noncompliance with other medical treatment and regimen: Secondary | ICD-10-CM

## 2019-11-15 LAB — COMPREHENSIVE METABOLIC PANEL
ALT: 15 U/L (ref 0–44)
AST: 16 U/L (ref 15–41)
Albumin: 3.7 g/dL (ref 3.5–5.0)
Alkaline Phosphatase: 68 U/L (ref 38–126)
Anion gap: 8 (ref 5–15)
BUN: 21 mg/dL — ABNORMAL HIGH (ref 6–20)
CO2: 26 mmol/L (ref 22–32)
Calcium: 8.6 mg/dL — ABNORMAL LOW (ref 8.9–10.3)
Chloride: 104 mmol/L (ref 98–111)
Creatinine, Ser: 1.07 mg/dL (ref 0.61–1.24)
GFR calc Af Amer: >60 mL/min (ref >60)
GFR calc non Af Amer: >60 mL/min (ref >60)
Glucose, Bld: 103 mg/dL — ABNORMAL HIGH (ref 70–99)
Potassium: 4.3 mmol/L (ref 3.5–5.1)
Sodium: 138 mmol/L (ref 135–145)
Total Bilirubin: 0.7 mg/dL (ref 0.3–1.2)
Total Protein: 6.8 g/dL (ref 6.5–8.1)

## 2019-11-15 LAB — LIPID PANEL
Cholesterol: 173 mg/dL (ref 0–200)
HDL: 31 mg/dL — ABNORMAL LOW (ref >40)
LDL Cholesterol: 114 mg/dL — ABNORMAL HIGH (ref 0–99)
Total CHOL/HDL Ratio: 5.6 RATIO
Triglycerides: 138 mg/dL (ref <150)
VLDL: 28 mg/dL (ref 0–40)

## 2019-11-15 MED ORDER — LISINOPRIL 20 MG PO TABS: 20.0000 mg | ORAL_TABLET | Freq: Every day | ORAL | 1 refills | Status: DC

## 2019-11-15 MED ORDER — AMLODIPINE BESYLATE 5 MG PO TABS: 5.0000 mg | ORAL_TABLET | Freq: Every day | ORAL | 1 refills | Status: DC

## 2019-11-15 MED ORDER — ATORVASTATIN CALCIUM 40 MG PO TABS: ORAL_TABLET | ORAL | 1 refills | Status: DC

## 2019-11-15 MED ORDER — ALBUTEROL SULFATE HFA 108 (90 BASE) MCG/ACT IN AERS: INHALATION_SPRAY | RESPIRATORY_TRACT | 1 refills | Status: DC

## 2019-11-15 MED ORDER — OMEPRAZOLE 20 MG PO CPDR: DELAYED_RELEASE_CAPSULE | ORAL | 1 refills | Status: DC

## 2019-11-15 NOTE — Progress Notes (Signed)
BP 136/80   Pulse 87   Temp (!) 97.3 F (36.3 C)   Wt 210 lb 3.2 oz (95.3 kg)   SpO2 96%   BMI 31.27 kg/m    Subjective:    Patient ID: [name], male    DOB: 1961/01/03, 59 y.o.   MRN: [id number]  HPI: [name] is a 59 y.o. male presenting on 11/15/2019 for Hypertension and Hyperlipidemia   HPI   Pt had a negative covid 19 screening questionnaire    Pt is not taking most of his meds; he says he is out of them Pt has no new complaints today. He continues to smoke.   Relevant past medical, surgical, family and social history reviewed and updated as indicated. Interim medical history since our last visit reviewed. Allergies and medications reviewed and updated.   Current Outpatient Medications:  .  albuterol (PROVENTIL HFA) 108 (90 Base) MCG/ACT inhaler, INHALE 2 PUFFS BY MOUTH EVERY 6 HOURS AS NEEDED FOR COUGHING, WHEEZING, OR SHORTNESS OF BREATH, Disp: 20.1 g, Rfl: 0 .  aspirin EC 81 MG EC tablet, Take 1 tablet (81 mg total) by mouth daily., Disp: , Rfl:  .  amLODipine (NORVASC) 5 MG tablet, Take 1 tablet (5 mg total) by mouth daily. (Patient not taking: Reported on 11/15/2019), Disp: 30 tablet, Rfl: 4 .  atorvastatin (LIPITOR) 40 MG tablet, TAKE 1 Tablet BY MOUTH ONCE DAILY AT 6 PM (Patient not taking: Reported on 11/15/2019), Disp: 30 tablet, Rfl: 3 .  citalopram (CELEXA) 20 MG tablet, Take 1 tablet (20 mg total) by mouth daily. (Patient not taking: Reported on 11/15/2019), Disp: 30 tablet, Rfl: 3 .  lisinopril (ZESTRIL) 20 MG tablet, Take 1 tablet (20 mg total) by mouth daily. (Patient not taking: Reported on 11/15/2019), Disp: 30 tablet, Rfl: 3 .  Melatonin 3 MG TABS, Take 1 tablet (3 mg total) by mouth at bedtime as needed (sleep). (Patient not taking: Reported on 11/15/2019), Disp: 30 tablet, Rfl: 0 .  omeprazole (PRILOSEC) 20 MG capsule, TAKE 1 Capsule  BY MOUTH TWICE DAILY BEFORE MEALS (Patient not taking: Reported on 11/15/2019), Disp: 60 capsule, Rfl: 3 .   tamsulosin (FLOMAX) 0.4 MG CAPS capsule, Take 1 capsule (0.4 mg total) by mouth at bedtime. (Patient not taking: Reported on 11/22/2018), Disp: 30 capsule, Rfl: 0    Review of Systems  Per HPI unless specifically indicated above     Objective:    BP 136/80   Pulse 87   Temp (!) 97.3 F (36.3 C)   Wt 210 lb 3.2 oz (95.3 kg)   SpO2 96%   BMI 31.27 kg/m   Wt Readings from Last 3 Encounters:  11/15/19 210 lb 3.2 oz (95.3 kg)  12/26/18 211 lb 4 oz (95.8 kg)  11/22/18 209 lb 4 oz (94.9 kg)    Physical Exam Vitals reviewed.  Constitutional:      General: He is not in acute distress.    Appearance: He is well-developed. He is not ill-appearing.  HENT:     Head: Normocephalic and atraumatic.  Cardiovascular:     Rate and Rhythm: Normal rate and regular rhythm.  Pulmonary:     Effort: Pulmonary effort is normal.     Breath sounds: Normal breath sounds. No wheezing.  Abdominal:     General: Bowel sounds are normal.     Palpations: Abdomen is soft.     Tenderness: There is no abdominal tenderness.  Musculoskeletal:     Cervical back:  Neck supple.     Right lower leg: No edema.     Left lower leg: No edema.  Lymphadenopathy:     Cervical: No cervical adenopathy.  Skin:    General: Skin is warm and dry.  Neurological:     Mental Status: He is alert and oriented to person, place, and time.  Psychiatric:        Attention and Perception: Attention normal.        Speech: Speech normal.        Behavior: Behavior normal. Behavior is cooperative.     Results for orders placed or performed during the hospital encounter of 11/15/19  Lipid panel  Result Value Ref Range   Cholesterol 173 0 - 200 mg/dL   Triglycerides 138 <150 mg/dL   HDL 31 (L) >40 mg/dL   Total CHOL/HDL Ratio 5.6 RATIO   VLDL 28 0 - 40 mg/dL   LDL Cholesterol 114 (H) 0 - 99 mg/dL  Comprehensive metabolic panel  Result Value Ref Range   Sodium 138 135 - 145 mmol/L   Potassium 4.3 3.5 - 5.1 mmol/L    Chloride 104 98 - 111 mmol/L   CO2 26 22 - 32 mmol/L   Glucose, Bld 103 (H) 70 - 99 mg/dL   BUN 21 (H) 6 - 20 mg/dL   Creatinine, Ser 1.07 0.61 - 1.24 mg/dL   Calcium 8.6 (L) 8.9 - 10.3 mg/dL   Total Protein 6.8 6.5 - 8.1 g/dL   Albumin 3.7 3.5 - 5.0 g/dL   AST 16 15 - 41 U/L   ALT 15 0 - 44 U/L   Alkaline Phosphatase 68 38 - 126 U/L   Total Bilirubin 0.7 0.3 - 1.2 mg/dL   GFR calc non Af Amer >60 >60 mL/min   GFR calc Af Amer >60 >60 mL/min   Anion gap 8 5 - 15      Assessment & Plan:    Encounter Diagnoses  Name Primary?  . Essential hypertension Yes  . Hyperlipidemia, unspecified hyperlipidemia type   . Tobacco use disorder   . Chronic obstructive pulmonary disease, unspecified COPD type ([place])   . Personal history of noncompliance with medical treatment, presenting hazards to health      -reviewed labs with pt  -counseled Smoking cessation -pt counseled to get back on medications.   He is signed up for [employer] medication assistance program -pt to follow up in 3 months.  He is to contact office sooner prn

## 2019-11-22 ENCOUNTER — Other Ambulatory Visit: Payer: Self-pay | Admitting: Physician Assistant

## 2019-11-22 MED ORDER — AMLODIPINE BESYLATE 5 MG PO TABS: 5.0000 mg | ORAL_TABLET | Freq: Every day | ORAL | 1 refills | Status: DC

## 2019-11-22 MED ORDER — ALBUTEROL SULFATE HFA 108 (90 BASE) MCG/ACT IN AERS: INHALATION_SPRAY | RESPIRATORY_TRACT | 1 refills | Status: DC

## 2019-11-22 MED ORDER — LISINOPRIL 20 MG PO TABS: 20.0000 mg | ORAL_TABLET | Freq: Every day | ORAL | 1 refills | Status: DC

## 2019-11-22 MED ORDER — ATORVASTATIN CALCIUM 40 MG PO TABS: ORAL_TABLET | ORAL | 1 refills | Status: DC

## 2019-11-22 MED ORDER — OMEPRAZOLE 20 MG PO CPDR: DELAYED_RELEASE_CAPSULE | ORAL | 1 refills | Status: DC

## 2019-11-30 ENCOUNTER — Other Ambulatory Visit: Payer: Self-pay | Admitting: Physician Assistant

## 2020-01-09 ENCOUNTER — Emergency Department (HOSPITAL_COMMUNITY): Payer: Self-pay

## 2020-01-09 ENCOUNTER — Emergency Department (HOSPITAL_COMMUNITY)
Admission: EM | Admit: 2020-01-09 | Discharge: 2020-01-09 | Disposition: A | Discharge status: Home / Self Care | Payer: Self-pay | Attending: Emergency Medicine | Admitting: Emergency Medicine

## 2020-01-09 ENCOUNTER — Other Ambulatory Visit: Payer: Self-pay

## 2020-01-09 ENCOUNTER — Encounter (HOSPITAL_COMMUNITY): Payer: Self-pay | Admitting: Emergency Medicine

## 2020-01-09 DIAGNOSIS — Z7982 Long term (current) use of aspirin: Secondary | ICD-10-CM | POA: Insufficient documentation

## 2020-01-09 DIAGNOSIS — Z8673 Personal history of transient ischemic attack (TIA), and cerebral infarction without residual deficits: Secondary | ICD-10-CM | POA: Insufficient documentation

## 2020-01-09 DIAGNOSIS — J449 Chronic obstructive pulmonary disease, unspecified: Secondary | ICD-10-CM | POA: Insufficient documentation

## 2020-01-09 DIAGNOSIS — Z79899 Other long term (current) drug therapy: Secondary | ICD-10-CM | POA: Insufficient documentation

## 2020-01-09 DIAGNOSIS — I129 Hypertensive chronic kidney disease with stage 1 through stage 4 chronic kidney disease, or unspecified chronic kidney disease: Secondary | ICD-10-CM | POA: Insufficient documentation

## 2020-01-09 DIAGNOSIS — F17210 Nicotine dependence, cigarettes, uncomplicated: Secondary | ICD-10-CM | POA: Insufficient documentation

## 2020-01-09 DIAGNOSIS — M1612 Unilateral primary osteoarthritis, left hip: Secondary | ICD-10-CM | POA: Insufficient documentation

## 2020-01-09 DIAGNOSIS — N182 Chronic kidney disease, stage 2 (mild): Secondary | ICD-10-CM | POA: Insufficient documentation

## 2020-01-09 MED ORDER — PREDNISONE 50 MG PO TABS: 50.0000 mg | ORAL_TABLET | Freq: Every day | ORAL | 0 refills | Status: DC

## 2020-01-09 MED ORDER — IBUPROFEN 800 MG PO TABS: 800.0000 mg | ORAL_TABLET | Freq: Three times a day (TID) | ORAL | 0 refills | Status: DC | PRN

## 2020-01-09 NOTE — ED Triage Notes (Signed)
Patient complains of left hip pain x 1 month. Patient denies injury. NAD. Ambulatory in triage.

## 2020-01-09 NOTE — ED Provider Notes (Signed)
[facility] EMERGENCY DEPARTMENT Provider Note   CSN: [id number] Arrival date & time: 01/09/20  1006     History Chief Complaint  Patient presents with  . Hip Pain    [name] is a 59 y.o. male with PMH significant for right pontine infarct on 08/11/2018 who presents to the ED with a 1 year history of left-sided hip pain that has become increasingly uncomfortable for the past month.  I reviewed patient's medical records and his MRI obtained had demonstrated a right pontine infarct which explains his left-sided hemiparesis in the ER on 08/11/2018.  Patient reports that his symptoms have largely improved, however he has had some left-sided hip discomfort that he describes as an "aching" with intermittent sharp shooting pain down his left leg ever since his event.  He works at [employer] and reports that his pain is worse with exertion.  He endorses diminished hip flexion, but no back pain, abdominal pain, numbness, weakness, fevers or chills, IVDA, history of latency, malignancy, recent illness, urinary symptoms, pain with defecation or other changes in bowel habits, testicular or penile discomfort, or other symptoms.  He is followed by his primary care provider, [facility], however has not discussed chronic pain management.  He applies topical patches, with some relief.  Patient ambulatory in the ER.  HPI     Past Medical History:  Diagnosis Date  . Acid reflux   . COPD (chronic obstructive pulmonary disease) (HCC)   . Depression   . Hypertension   . Kidney stone   . Stroke [facility])     Patient Active Problem List   Diagnosis Date Noted  . COPD (chronic obstructive pulmonary disease) (HCC) 04/11/2019  . 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 Labile blood pressure   . CKD (chronic kidney disease), stage II   . Benign prostatic hyperplasia   . Hyperlipidemia 08/14/2018  . Tobacco use disorder 08/14/2018  . Obesity 08/14/2018  . GERD (gastroesophageal reflux disease)  08/14/2018  . Leg cramp, left 08/14/2018  . Diastolic dysfunction   . Polysubstance abuse (HCC)   . Right pontine cerebrovascular accident [facility]) s/p tPA 08/11/2018  . Hypertension 02/17/2014    Past Surgical History:  Procedure Laterality Date  . CHOLECYSTECTOMY         Family History  Problem Relation Age of Onset  . Epilepsy Mother   . Hypertension Father   . Heart disease Father   . Hyperlipidemia Father   . Diabetes Father     Social History   Tobacco Use  . Smoking status: Current Every Day Smoker    Packs/day: 1.00    Years: 40.00    Pack years: 40.00    Types: Cigarettes  . Smokeless tobacco: Never Used  . Tobacco comment: pack a day was two packs before stroke  Substance Use Topics  . Alcohol use: Not Currently    Alcohol/week: 0.0 standard drinks    Comment: none since 2004. previously heavy drinker  . Drug use: Not Currently    Types: Marijuana, Cocaine    Comment: last use 08/11/2018    Home Medications Prior to Admission medications   Medication Sig Start Date End Date Taking? Authorizing Provider  albuterol (PROVENTIL HFA) 108 (90 Base) MCG/ACT inhaler INHALE 2 PUFFS BY MOUTH EVERY 6 HOURS AS NEEDED FOR COUGHING, WHEEZING, OR SHORTNESS OF BREATH 11/22/19  Yes Jacquelin Hawking, PA-C  amLODipine (NORVASC) 5 MG tablet Take 1 tablet (5 mg total) by mouth daily. 11/22/19  Yes Jacquelin Hawking,  PA-C  aspirin EC 81 MG EC tablet Take 1 tablet (81 mg total) by mouth daily. 08/15/18  Yes Layne Benton, NP  atorvastatin (LIPITOR) 40 MG tablet TAKE 1 Tablet BY MOUTH ONCE DAILY AT 6 PM 11/22/19  Yes Jacquelin Hawking, PA-C  citalopram (CELEXA) 20 MG tablet Take 1 tablet (20 mg total) by mouth daily. 09/17/19  Yes Jacquelin Hawking, PA-C  lisinopril (ZESTRIL) 20 MG tablet Take 1 tablet (20 mg total) by mouth daily. 11/22/19  Yes Jacquelin Hawking, PA-C  Melatonin 3 MG TABS Take 1 tablet (3 mg total) by mouth at bedtime as needed (sleep). 08/18/18  Yes Angiulli, Mcarthur Rossetti,  PA-C  omeprazole (PRILOSEC) 20 MG capsule TAKE 1 Capsule  BY MOUTH TWICE DAILY BEFORE MEALS 11/30/19  Yes Jacquelin Hawking, PA-C  predniSONE (DELTASONE) 50 MG tablet Take 1 tablet (50 mg total) by mouth daily. 01/09/20   Lorelee New, PA-C  tamsulosin (FLOMAX) 0.4 MG CAPS capsule Take 1 capsule (0.4 mg total) by mouth at bedtime. Patient not taking: Reported on 11/22/2018 09/18/18   Jacquelin Hawking, PA-C    Allergies    Morphine and related and Penicillins  Review of Systems   Review of Systems  Constitutional: Negative for chills and fever.  Gastrointestinal: Negative for abdominal pain.  Musculoskeletal: Positive for arthralgias. Negative for back pain and joint swelling.  Skin: Negative for color change and wound.  Neurological: Negative for weakness and numbness.    Physical Exam Updated Vital Signs BP (!) 158/90 (BP Location: Left Arm)   Pulse 86   Temp 98.1 F (36.7 C) (Oral)   Resp 16   Ht 5\' 8"  (1.727 m)   Wt 95.3 kg   SpO2 97%   BMI 31.93 kg/m   Physical Exam Vitals and nursing note reviewed. Exam conducted with a chaperone present.  Constitutional:      General: He is not in acute distress.    Appearance: Normal appearance. He is not ill-appearing.  HENT:     Head: Normocephalic and atraumatic.  Eyes:     General: No scleral icterus.    Conjunctiva/sclera: Conjunctivae normal.  Cardiovascular:     Rate and Rhythm: Normal rate and regular rhythm.     Pulses: Normal pulses.     Heart sounds: Normal heart sounds.  Pulmonary:     Effort: Pulmonary effort is normal. No respiratory distress.     Breath sounds: Normal breath sounds.  Musculoskeletal:     Comments: Left hip: Mild TTP over area of the proximal femur, but no TTP elsewhere.  Mildly diminished hip flexion.  AROM approximately 110 degrees before being limited.  Extension intact.  Able to weight-bear.  Ambulation intact, albeit antalgic.   Left knee: Normal.  ROM and strength intact.  Distal pulses and  sensation intact. Spine: No midline thoracic, lumbar, or sacral TTP.  No overlying skin changes.  Skin:    General: Skin is dry.     Capillary Refill: Capillary refill takes less than 2 seconds.  Neurological:     Mental Status: He is alert and oriented to person, place, and time.     GCS: GCS eye subscore is 4. GCS verbal subscore is 5. GCS motor subscore is 6.  Psychiatric:        Mood and Affect: Mood normal.        Behavior: Behavior normal.        Thought Content: Thought content normal.      ED Results / Procedures /  Treatments   Labs (all labs ordered are listed, but only abnormal results are displayed) Labs Reviewed - No data to display  EKG None  Radiology DG Hip Unilat W or Wo Pelvis 2-3 Views Left  Result Date: 01/09/2020 CLINICAL DATA:  Chronic left hip pain. EXAM: DG HIP (WITH OR WITHOUT PELVIS) 2-3V LEFT COMPARISON:  CT scan of the abdomen and pelvis dated 06/26/2017 FINDINGS: There is progressive superior joint space narrowing of the left hip. Small marginal osteophytes on the acetabulum and femoral head appear unchanged since the prior CT scan. Minimal osteophyte formation of the right hip. No abnormal soft tissue calcifications. No fractures or bone destruction. IMPRESSION: Progressive osteoarthritis of the left hip. Electronically Signed   By: Lorriane Shire M.D.   On: 01/09/2020 11:28    Procedures Procedures (including critical care time)  Medications Ordered in ED Medications - No data to display  ED Course  I have reviewed the triage vital signs and the nursing notes.  Pertinent labs & imaging results that were available during my care of the patient were reviewed by me and considered in my medical decision making (see chart for details).    MDM Rules/Calculators/A&P                      I reviewed plain films obtained of left hip which demonstrates progressive osteoarthritis.  This is consistent with patient's HPI of left hip "throbbing" that is  progressively worse as the day goes on and worse with movement and exertion.  Recommending that patient continue topical pain patches and considered ibuprofen 800 mg 3 times daily.  Patient denies any history of kidney impairment nor peptic ulcer disease.  Most recent CMP was 11/15/2019 and demonstrated normal renal function.  Patient does endorse reflux disease, but takes omeprazole regularly.  He does endorse citalopram use however so will prescribe prednisone burst instead.  We will refer patient to orthopedics for ongoing evaluation and management of his progressive osteoarthritis that, when compared to CT obtained in 2018, is demonstrating a progressive narrowing.  Also encouraging patient to follow-up with his primary care provider regarding today's encounter.  Strict ED return precautions discussed.  All of the evaluation and work-up results were discussed with the patient and any family at bedside. They were provided opportunity to ask any additional questions and have none at this time. They have expressed understanding of verbal discharge instructions as well as return precautions and are agreeable to the plan.    Final Clinical Impression(s) / ED Diagnoses Final diagnoses:  Primary osteoarthritis of left hip    Rx / DC Orders ED Discharge Orders         Ordered    ibuprofen (ADVIL) 800 MG tablet  3 times daily with meals PRN,   Status:  Discontinued     01/09/20 1201    predniSONE (DELTASONE) 50 MG tablet  Daily     01/09/20 1204           [name] 01/09/20 1205    Nat Christen, MD 01/10/20 [phone]

## 2020-01-09 NOTE — Discharge Instructions (Signed)
Please call your primary care provider regarding today's encounter.  Also like you to follow-up with orthopedist, Dr. Romeo Apple, for ongoing evaluation and management of your progressively worsening left hip osteoarthritis.  Please continue your topical pain patches.  Please take the prednisone with breakfast each morning.    Please return to the ED or seek immediate medical attention should you experience any new or worsening symptoms.

## 2020-01-15 ENCOUNTER — Ambulatory Visit: Payer: Self-pay | Admitting: Orthopaedic Surgery

## 2020-01-15 ENCOUNTER — Other Ambulatory Visit: Payer: Self-pay

## 2020-01-15 ENCOUNTER — Encounter: Payer: Self-pay | Admitting: Orthopaedic Surgery

## 2020-01-15 VITALS — BP 163/87 | HR 71 | Temp 98.2°F | Ht 69.0 in | Wt 200.2 lb

## 2020-01-15 DIAGNOSIS — F17210 Nicotine dependence, cigarettes, uncomplicated: Secondary | ICD-10-CM

## 2020-01-15 DIAGNOSIS — M25552 Pain in left hip: Secondary | ICD-10-CM

## 2020-01-15 MED ORDER — NAPROXEN 500 MG PO TABS: 500.0000 mg | ORAL_TABLET | Freq: Two times a day (BID) | ORAL | 5 refills | Status: DC

## 2020-01-15 NOTE — Progress Notes (Signed)
Subjective:    Patient ID: [name], male    DOB: January 17, 1961, 59 y.o.   MRN: [id number]  HPI He has long history of left hip pain. It has gotten worse in the last few weeks.  He works at a saw mill.  He has more pain with the cold weather and rain.  He has no trauma.  He was seen in the ER 01-09-2020.  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  I have reviewed the ER notes.  I have independently reviewed and interpreted x-rays of this patient done at another site by another physician or qualified health professional.  I have told him he has worn out his hip.  He needs to consider a total joint.  He is using a cane.  I will call in Naprosyn.  Return here in six weeks.   Review of Systems  Constitutional: Positive for activity change.  Respiratory: Positive for shortness of breath.   Musculoskeletal: Positive for arthralgias, gait problem and joint swelling.  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  All other systems reviewed and are negative.  For Review of Systems, all other systems reviewed and are negative.  The following is a summary of the past history medically, past history surgically, known current medicines, social history and family history.  This information is gathered electronically by the computer from prior information and documentation.  I review this each visit and have found including this information at this point in the chart is beneficial and informative.   Past Medical History:  Diagnosis Date  . Acid reflux   . COPD (chronic obstructive pulmonary disease) (HCC)   . Depression   . Hypertension   . Kidney stone   . Stroke [facility])     Past Surgical History:  Procedure Laterality Date  . CHOLECYSTECTOMY      Current Outpatient Medications on File Prior to Visit  Medication Sig Dispense Refill  . albuterol (PROVENTIL HFA) 108 (90 Base) MCG/ACT inhaler  INHALE 2 PUFFS BY MOUTH EVERY 6 HOURS AS NEEDED FOR COUGHING, WHEEZING, OR SHORTNESS OF BREATH 20.1 g 1  . amLODipine (NORVASC) 5 MG tablet Take 1 tablet (5 mg total) by mouth daily. 90 tablet 1  . aspirin EC 81 MG EC tablet Take 1 tablet (81 mg total) by mouth daily.    Marland Kitchen atorvastatin (LIPITOR) 40 MG tablet TAKE 1 Tablet BY MOUTH ONCE DAILY AT 6 PM 90 tablet 1  . citalopram (CELEXA) 20 MG tablet Take 1 tablet (20 mg total) by mouth daily. 30 tablet 3  . lisinopril (ZESTRIL) 20 MG tablet Take 1 tablet (20 mg total) by mouth daily. 90 tablet 1  . Melatonin 3 MG TABS Take 1 tablet (3 mg total) by mouth at bedtime as needed (sleep). (Patient not taking: Reported on 01/15/2020) 30 tablet 0  . omeprazole (PRILOSEC) 20 MG capsule TAKE 1 Capsule  BY MOUTH TWICE DAILY BEFORE MEALS 90 capsule 1  . predniSONE (DELTASONE) 50 MG tablet Take 1 tablet (50 mg total) by mouth daily. (Patient not taking: Reported on 01/15/2020) 5 tablet 0  . tamsulosin (FLOMAX) 0.4 MG CAPS capsule Take 1 capsule (0.4 mg total) by mouth at bedtime. (Patient not taking: Reported on 11/22/2018) 30 capsule 0   No current facility-administered medications on file prior to visit.    Social History   Socioeconomic  History  . Marital status: Married    Spouse name: Not on file  . Number of children: Not on file  . Years of education: Not on file  . Highest education level: Not on file  Occupational History  . Not on file  Tobacco Use  . Smoking status: Current Every Day Smoker    Packs/day: 1.00    Years: 40.00    Pack years: 40.00    Types: Cigarettes  . Smokeless tobacco: Never Used  . Tobacco comment: pack a day was two packs before stroke  Substance and Sexual Activity  . Alcohol use: Not Currently    Alcohol/week: 0.0 standard drinks    Comment: none since 2004. previously heavy drinker  . Drug use: Not Currently    Types: Marijuana, Cocaine    Comment: last use 08/11/2018  . Sexual activity: Not on file  Other  Topics Concern  . Not on file  Social History Narrative  . Not on file   Social Determinants of Health   Financial Resource Strain:   . Difficulty of Paying Living Expenses:   Food Insecurity:   . Worried About Programme researcher, broadcasting/film/video in the Last Year:   . Barista in the Last Year:   Transportation Needs:   . Freight forwarder (Medical):   Marland Kitchen Lack of Transportation (Non-Medical):   Physical Activity:   . Days of Exercise per Week:   . Minutes of Exercise per Session:   Stress:   . Feeling of Stress :   Social Connections:   . Frequency of Communication with Friends and Family:   . Frequency of Social Gatherings with Friends and Family:   . Attends Religious Services:   . Active Member of Clubs or Organizations:   . Attends Banker Meetings:   Marland Kitchen Marital Status:   Intimate Partner Violence:   . Fear of Current or Ex-Partner:   . Emotionally Abused:   Marland Kitchen Physically Abused:   . Sexually Abused:     Family History  Problem Relation Age of Onset  . Epilepsy Mother   . Hypertension Father   . Heart disease Father   . Hyperlipidemia Father   . Diabetes Father     BP (!) 163/87   Pulse 71   Temp 98.2 F (36.8 C)   Ht 5\' 9"  (1.753 m)   Wt 200 lb 4 oz (90.8 kg)   BMI 29.57 kg/m   Body mass index is 29.57 kg/m.     Objective:   Physical Exam Vitals and nursing note reviewed.  Constitutional:      Appearance: He is well-developed.  HENT:     Head: Normocephalic and atraumatic.  Eyes:     Conjunctiva/sclera: Conjunctivae normal.     Pupils: Pupils are equal, round, and reactive to light.  Cardiovascular:     Rate and Rhythm: Normal rate and regular rhythm.  Pulmonary:     Effort: Pulmonary effort is normal.  Abdominal:     Palpations: Abdomen is soft.  Musculoskeletal:     Cervical back: Normal range of motion and neck supple.       Legs:  Skin:    General: Skin is warm and dry.  Neurological:     Mental Status: He is alert and  oriented to person, place, and time.     Cranial Nerves: No cranial nerve deficit.     Motor: No abnormal muscle tone.     Coordination: Coordination normal.  Deep Tendon Reflexes: Reflexes are normal and symmetric. Reflexes normal.  Psychiatric:        Behavior: Behavior normal.        Thought Content: Thought content normal.        Judgment: Judgment normal.    He smokes.       Assessment & Plan:   Encounter Diagnoses  Name Primary?  . Left hip pain Yes  . Nicotine dependence, cigarettes, uncomplicated    I have explained about a total hip and what it involves.  He will consider it.  He will contact [facility].  I will call in naprosyn.  Return in six weeks.  Call if any problem.  Precautions discussed.   Electronically Signed Sanjuana Kava, MD 3/23/20213:19 PM

## 2020-01-15 NOTE — Patient Instructions (Signed)

## 2020-01-24 ENCOUNTER — Telehealth: Payer: Self-pay | Admitting: Orthopaedic Surgery

## 2020-01-24 MED ORDER — HYDROCODONE-ACETAMINOPHEN 5-325 MG PO TABS: ORAL_TABLET | ORAL | 0 refills | Status: DC

## 2020-01-24 NOTE — Telephone Encounter (Signed)
[name] called and stated that [name] has been having some pain and this has made it hard for him to sleep at night.  He wants to know if he can have something for pain.  I did explain that Dr. Hilda Lias was here this morning and was already gone this afternoon but that I would go ahead and put in a message for him  He states he uses [facility] in [place]

## 2020-01-28 ENCOUNTER — Telehealth: Payer: Self-pay | Admitting: Orthopaedic Surgery

## 2020-01-28 NOTE — Telephone Encounter (Signed)
Patient asking if he may stay out of work "for awhile" due to his difficulty with increasing hip pain. States taking the medication as prescribed by Dr Hilda Lias but that it is not helping much. States also that he is '"ready to go ahead with hip surgery."  Patient is aware of next scheduled appointment with Dr Hilda Lias 02/26/20.  Please advise.

## 2020-01-28 NOTE — Telephone Encounter (Signed)
Patient states has received the prescription.

## 2020-01-29 NOTE — Telephone Encounter (Signed)
Called patient to notify; left voice message to return call.

## 2020-01-29 NOTE — Telephone Encounter (Signed)
OK.   He can be out of work.

## 2020-01-30 NOTE — Telephone Encounter (Signed)
Called back to patient due to no response; left another message.

## 2020-01-31 ENCOUNTER — Ambulatory Visit: Payer: Self-pay | Attending: Internal Medicine

## 2020-01-31 DIAGNOSIS — Z23 Encounter for immunization: Secondary | ICD-10-CM

## 2020-01-31 NOTE — Progress Notes (Signed)
   Covid-19 Vaccination Clinic  Name:  [name]    MRN: [id number] DOB: 1961/03/20  01/31/2020  [name] was observed post Covid-19 immunization for 15 minutes without incident. He was provided with Vaccine Information Sheet and instruction to access the V-Safe system.   [name] was instructed to call 911 with any severe reactions post vaccine: Marland Kitchen Difficulty breathing  . Swelling of face and throat  . A fast heartbeat  . A bad rash all over body  . Dizziness and weakness   Immunizations Administered    Name Date Dose VIS Date Route   Moderna COVID-19 Vaccine 01/31/2020  8:16 AM 0.5 mL 09/25/2019 Intramuscular   Manufacturer: Moderna   Lot: [id number]   NDC: 41364-383-77

## 2020-02-01 NOTE — Telephone Encounter (Signed)
No return call as of yet from patient to phone messages left; therefore, note not yet issued; note has been approved as noted per Dr Hilda Lias.

## 2020-02-05 ENCOUNTER — Ambulatory Visit (INDEPENDENT_AMBULATORY_CARE_PROVIDER_SITE_OTHER): Payer: Self-pay | Admitting: Orthopaedic Surgery

## 2020-02-05 ENCOUNTER — Encounter: Payer: Self-pay | Admitting: Orthopaedic Surgery

## 2020-02-05 ENCOUNTER — Other Ambulatory Visit: Payer: Self-pay

## 2020-02-05 VITALS — Temp 99.1°F | Ht 69.0 in | Wt 200.0 lb

## 2020-02-05 DIAGNOSIS — M25552 Pain in left hip: Secondary | ICD-10-CM

## 2020-02-05 DIAGNOSIS — F17210 Nicotine dependence, cigarettes, uncomplicated: Secondary | ICD-10-CM

## 2020-02-05 MED ORDER — OXYCODONE-ACETAMINOPHEN 5-325 MG PO TABS: 1.0000 | ORAL_TABLET | Freq: Four times a day (QID) | ORAL | 0 refills | Status: DC | PRN

## 2020-02-05 NOTE — Progress Notes (Signed)
Patient [name], male DOB:07-May-1961, 59 y.o. EPP:[id number]  Chief Complaint  Patient presents with  . Hip Pain    Left side     HPI  [name] is a 59 y.o. male who has marked pain of the left hip.  He has worn it out and needs a total hip.  He cannot work.  He is waiting on [employer].  He has applied for it.  He has taken the Naprosyn with no help.  I will call in pain medicine. He needs the surgery.   Body mass index is 29.53 kg/m.  ROS  Review of Systems  Constitutional: Positive for activity change.  Respiratory: Positive for shortness of breath.   Musculoskeletal: Positive for arthralgias, gait problem and joint swelling.  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  All other systems reviewed and are negative.   All other systems reviewed and are negative.  The following is a summary of the past history medically, past history surgically, known current medicines, social history and family history.  This information is gathered electronically by the computer from prior information and documentation.  I review this each visit and have found including this information at this point in the chart is beneficial and informative.    Past Medical History:  Diagnosis Date  . Acid reflux   . COPD (chronic obstructive pulmonary disease) (HCC)   . Depression   . Hypertension   . Kidney stone   . Stroke [facility])     Past Surgical History:  Procedure Laterality Date  . CHOLECYSTECTOMY      Family History  Problem Relation Age of Onset  . Epilepsy Mother   . Hypertension Father   . Heart disease Father   . Hyperlipidemia Father   . Diabetes Father     Social History Social History   Tobacco Use  . Smoking status: Current Every Day Smoker    Packs/day: 1.00    Years: 40.00    Pack years: 40.00    Types: Cigarettes  . Smokeless tobacco: Never Used  . Tobacco comment: pack a day was two packs  before stroke  Substance Use Topics  . Alcohol use: Not Currently    Alcohol/week: 0.0 standard drinks    Comment: none since 2004. previously heavy drinker  . Drug use: Not Currently    Types: Marijuana, Cocaine    Comment: last use 08/11/2018    Allergies  Allergen Reactions  . Morphine And Related     Altered mental status  . Penicillins Hives    Has patient had a PCN reaction causing immediate rash, facial/tongue/throat swelling, SOB or lightheadedness with hypotension: Yes Has patient had a PCN reaction causing severe rash involving mucus membranes or skin necrosis: No Has patient had a PCN reaction that required hospitalization: No Has patient had a PCN reaction occurring within the last 10 years: No If all of the above answers are "NO", then may proceed with Cephalosporin use.     Current Outpatient Medications  Medication Sig Dispense Refill  . albuterol (PROVENTIL HFA) 108 (90 Base) MCG/ACT inhaler INHALE 2 PUFFS BY MOUTH EVERY 6 HOURS AS NEEDED FOR COUGHING, WHEEZING, OR SHORTNESS OF BREATH 20.1 g 1  . amLODipine (NORVASC) 5 MG tablet Take 1 tablet (5 mg total) by mouth daily. 90 tablet 1  . aspirin EC 81 MG EC tablet Take 1 tablet (81 mg total)  by mouth daily.    Marland Kitchen atorvastatin (LIPITOR) 40 MG tablet TAKE 1 Tablet BY MOUTH ONCE DAILY AT 6 PM 90 tablet 1  . citalopram (CELEXA) 20 MG tablet Take 1 tablet (20 mg total) by mouth daily. 30 tablet 3  . lisinopril (ZESTRIL) 20 MG tablet Take 1 tablet (20 mg total) by mouth daily. 90 tablet 1  . omeprazole (PRILOSEC) 20 MG capsule TAKE 1 Capsule  BY MOUTH TWICE DAILY BEFORE MEALS 90 capsule 1  . HYDROcodone-acetaminophen (NORCO/VICODIN) 5-325 MG tablet One tablet every four hours as needed for acute pain.  Limit of five days per [place] statue. (Patient not taking: Reported on 02/05/2020) 30 tablet 0  . Melatonin 3 MG TABS Take 1 tablet (3 mg total) by mouth at bedtime as needed (sleep). (Patient not taking: Reported on 01/15/2020)  30 tablet 0  . naproxen (NAPROSYN) 500 MG tablet Take 1 tablet (500 mg total) by mouth 2 (two) times daily with a meal. (Patient not taking: Reported on 02/05/2020) 60 tablet 5  . oxyCODONE-acetaminophen (PERCOCET/ROXICET) 5-325 MG tablet Take 1 tablet by mouth every 6 (six) hours as needed for up to 14 days for severe pain. One tablet every six hours for pain. 14 day limit 56 tablet 0  . predniSONE (DELTASONE) 50 MG tablet Take 1 tablet (50 mg total) by mouth daily. (Patient not taking: Reported on 01/15/2020) 5 tablet 0  . tamsulosin (FLOMAX) 0.4 MG CAPS capsule Take 1 capsule (0.4 mg total) by mouth at bedtime. (Patient not taking: Reported on 11/22/2018) 30 capsule 0   No current facility-administered medications for this visit.     Physical Exam  Temperature 99.1 F (37.3 C), height 5\' 9"  (1.753 m), weight 200 lb (90.7 kg).  Constitutional: overall normal hygiene, normal nutrition, well developed, normal grooming, normal body habitus. Assistive device:cane  Musculoskeletal: gait and station Limp left, muscle tone and strength are normal, no tremors or atrophy is present.  .  Neurological: coordination overall normal.  Deep tendon reflex/nerve stretch intact.  Sensation normal.  Cranial nerves II-XII intact.   Skin:   Normal overall no scars, lesions, ulcers or rashes. No psoriasis.  Psychiatric: Alert and oriented x 3.  Recent memory intact, remote memory unclear.  Normal mood and affect. Well groomed.  Good eye contact.  Cardiovascular: overall no swelling, no varicosities, no edema bilaterally, normal temperatures of the legs and arms, no clubbing, cyanosis and good capillary refill.  Lymphatic: palpation is normal.  Left hip with significant pain, decreased ROM.  Limp left.  In marked pain.  NV intact.  All other systems reviewed and are negative   The patient has been educated about the nature of the problem(s) and counseled on treatment options.  The patient appeared to  understand what I have discussed and is in agreement with it.  Encounter Diagnoses  Name Primary?  . Left hip pain Yes  . Nicotine dependence, cigarettes, uncomplicated     PLAN Call if any problems.  Precautions discussed.  Continue current medications.   Return to clinic 1 month   I have reviewed the [place] web site prior to prescribing narcotic medicine for this patient.   Electronically Signed Sanjuana Kava, MD 4/13/20213:50 PM

## 2020-02-14 ENCOUNTER — Other Ambulatory Visit (HOSPITAL_COMMUNITY)
Admission: RE | Admit: 2020-02-14 | Discharge: 2020-02-14 | Disposition: A | Discharge status: Home / Self Care | Payer: Self-pay | Source: Ambulatory Visit | Attending: Physician Assistant | Admitting: Physician Assistant

## 2020-02-14 ENCOUNTER — Encounter: Payer: Self-pay | Admitting: Physician Assistant

## 2020-02-14 ENCOUNTER — Ambulatory Visit: Payer: Self-pay | Admitting: Physician Assistant

## 2020-02-14 ENCOUNTER — Other Ambulatory Visit: Payer: Self-pay

## 2020-02-14 VITALS — BP 100/60 | HR 89 | Temp 97.7°F | Wt 194.8 lb

## 2020-02-14 DIAGNOSIS — F17200 Nicotine dependence, unspecified, uncomplicated: Secondary | ICD-10-CM

## 2020-02-14 DIAGNOSIS — J449 Chronic obstructive pulmonary disease, unspecified: Secondary | ICD-10-CM

## 2020-02-14 DIAGNOSIS — E785 Hyperlipidemia, unspecified: Secondary | ICD-10-CM | POA: Insufficient documentation

## 2020-02-14 DIAGNOSIS — L309 Dermatitis, unspecified: Secondary | ICD-10-CM

## 2020-02-14 DIAGNOSIS — I10 Essential (primary) hypertension: Secondary | ICD-10-CM | POA: Insufficient documentation

## 2020-02-14 LAB — COMPREHENSIVE METABOLIC PANEL
ALT: 15 U/L (ref 0–44)
AST: 16 U/L (ref 15–41)
Albumin: 3.7 g/dL (ref 3.5–5.0)
Alkaline Phosphatase: 84 U/L (ref 38–126)
Anion gap: 10 (ref 5–15)
BUN: 29 mg/dL — ABNORMAL HIGH (ref 6–20)
CO2: 26 mmol/L (ref 22–32)
Calcium: 8.9 mg/dL (ref 8.9–10.3)
Chloride: 99 mmol/L (ref 98–111)
Creatinine, Ser: 0.95 mg/dL (ref 0.61–1.24)
GFR calc Af Amer: >60 mL/min (ref >60)
GFR calc non Af Amer: >60 mL/min (ref >60)
Glucose, Bld: 113 mg/dL — ABNORMAL HIGH (ref 70–99)
Potassium: 4.1 mmol/L (ref 3.5–5.1)
Sodium: 135 mmol/L (ref 135–145)
Total Bilirubin: 0.4 mg/dL (ref 0.3–1.2)
Total Protein: 6.9 g/dL (ref 6.5–8.1)

## 2020-02-14 LAB — LIPID PANEL
Cholesterol: 136 mg/dL (ref 0–200)
HDL: 29 mg/dL — ABNORMAL LOW (ref >40)
LDL Cholesterol: 83 mg/dL (ref 0–99)
Total CHOL/HDL Ratio: 4.7 RATIO
Triglycerides: 118 mg/dL (ref <150)
VLDL: 24 mg/dL (ref 0–40)

## 2020-02-14 MED ORDER — HYDROCORTISONE 1 % EX OINT: 1.0000 "application " | TOPICAL_OINTMENT | Freq: Two times a day (BID) | CUTANEOUS | 0 refills | Status: DC

## 2020-02-14 NOTE — Progress Notes (Signed)
BP 100/60   Pulse 89   Temp 97.7 F (36.5 C)   Wt 194 lb 12.8 oz (88.4 kg)   SpO2 98%   BMI 28.77 kg/m    Subjective:    Patient ID: [name], male    DOB: 10-30-1960, 59 y.o.   MRN: [id number]  HPI: [name] is a 59 y.o. male presenting on 02/14/2020 for Hypertension and Hyperlipidemia   HPI  Pt had a negative covid 19 screening questionnaire.    Pt is 58yoM with HTN, dyslipiemia, GERD, depression, COPD, history of polysubstance abuse,  And current Tobacco use disorder.  He says he is doing good except for his hip.  He is seeing orthopedics in hopes of getting hip replacement soon.      Relevant past medical, surgical, family and social history reviewed and updated as indicated. Interim medical history since our last visit reviewed. Allergies and medications reviewed and updated.    Current Outpatient Medications:  .  albuterol (PROVENTIL HFA) 108 (90 Base) MCG/ACT inhaler, INHALE 2 PUFFS BY MOUTH EVERY 6 HOURS AS NEEDED FOR COUGHING, WHEEZING, OR SHORTNESS OF BREATH, Disp: 20.1 g, Rfl: 1 .  amLODipine (NORVASC) 5 MG tablet, Take 1 tablet (5 mg total) by mouth daily., Disp: 90 tablet, Rfl: 1 .  atorvastatin (LIPITOR) 40 MG tablet, TAKE 1 Tablet BY MOUTH ONCE DAILY AT 6 PM, Disp: 90 tablet, Rfl: 1 .  citalopram (CELEXA) 20 MG tablet, Take 1 tablet (20 mg total) by mouth daily., Disp: 30 tablet, Rfl: 3 .  lisinopril (ZESTRIL) 20 MG tablet, Take 1 tablet (20 mg total) by mouth daily., Disp: 90 tablet, Rfl: 1 .  omeprazole (PRILOSEC) 20 MG capsule, TAKE 1 Capsule  BY MOUTH TWICE DAILY BEFORE MEALS, Disp: 90 capsule, Rfl: 1 .  oxyCODONE-acetaminophen (PERCOCET/ROXICET) 5-325 MG tablet, Take 1 tablet by mouth every 6 (six) hours as needed for up to 14 days for severe pain. One tablet every six hours for pain. 14 day limit, Disp: 56 tablet, Rfl: 0 .  aspirin EC 81 MG EC tablet, Take 1 tablet (81 mg total) by mouth daily. (Patient not taking: Reported on 02/14/2020), Disp: ,  Rfl:  .  HYDROcodone-acetaminophen (NORCO/VICODIN) 5-325 MG tablet, One tablet every four hours as needed for acute pain.  Limit of five days per  statue. (Patient not taking: Reported on 02/05/2020), Disp: 30 tablet, Rfl: 0 .  hydrocortisone 1 % ointment, Apply 1 application topically 2 (two) times daily., Disp: 454 g, Rfl: 0 .  Melatonin 3 MG TABS, Take 1 tablet (3 mg total) by mouth at bedtime as needed (sleep). (Patient not taking: Reported on 01/15/2020), Disp: 30 tablet, Rfl: 0 .  naproxen (NAPROSYN) 500 MG tablet, Take 1 tablet (500 mg total) by mouth 2 (two) times daily with a meal. (Patient not taking: Reported on 02/05/2020), Disp: 60 tablet, Rfl: 5 .  predniSONE (DELTASONE) 50 MG tablet, Take 1 tablet (50 mg total) by mouth daily. (Patient not taking: Reported on 01/15/2020), Disp: 5 tablet, Rfl: 0 .  tamsulosin (FLOMAX) 0.4 MG CAPS capsule, Take 1 capsule (0.4 mg total) by mouth at bedtime. (Patient not taking: Reported on 11/22/2018), Disp: 30 capsule, Rfl: 0   Review of Systems  Per HPI unless specifically indicated above     Objective:    BP 100/60   Pulse 89   Temp 97.7 F (36.5 C)   Wt 194 lb 12.8 oz (88.4 kg)   SpO2 98%  BMI 28.77 kg/m   Wt Readings from Last 3 Encounters:  02/14/20 194 lb 12.8 oz (88.4 kg)  02/05/20 200 lb (90.7 kg)  01/15/20 200 lb 4 oz (90.8 kg)    Physical Exam Vitals reviewed.  Constitutional:      General: He is not in acute distress.    Appearance: He is well-developed. He is not ill-appearing.  HENT:     Head: Normocephalic and atraumatic.  Cardiovascular:     Rate and Rhythm: Normal rate and regular rhythm.  Pulmonary:     Effort: Pulmonary effort is normal.     Breath sounds: Normal breath sounds. No wheezing.  Abdominal:     General: Bowel sounds are normal.     Palpations: Abdomen is soft.     Tenderness: There is no abdominal tenderness.  Musculoskeletal:     Cervical back: Neck supple.     Right lower leg: No edema.      Left lower leg: No edema.  Lymphadenopathy:     Cervical: No cervical adenopathy.  Skin:    General: Skin is warm and dry.     Comments: Excoriations and scabbing extensively on BLE.  Pt admits to scratching extensiively.  No redness or secondary infection  Neurological:     Mental Status: He is alert and oriented to person, place, and time.  Psychiatric:        Attention and Perception: Attention normal.        Speech: Speech normal.        Behavior: Behavior normal. Behavior is cooperative.     Results for orders placed or performed during the hospital encounter of 02/14/20  Comprehensive metabolic panel  Result Value Ref Range   Sodium 135 135 - 145 mmol/L   Potassium 4.1 3.5 - 5.1 mmol/L   Chloride 99 98 - 111 mmol/L   CO2 26 22 - 32 mmol/L   Glucose, Bld 113 (H) 70 - 99 mg/dL   BUN 29 (H) 6 - 20 mg/dL   Creatinine, Ser 0.95 0.61 - 1.24 mg/dL   Calcium 8.9 8.9 - 10.3 mg/dL   Total Protein 6.9 6.5 - 8.1 g/dL   Albumin 3.7 3.5 - 5.0 g/dL   AST 16 15 - 41 U/L   ALT 15 0 - 44 U/L   Alkaline Phosphatase 84 38 - 126 U/L   Total Bilirubin 0.4 0.3 - 1.2 mg/dL   GFR calc non Af Amer >60 >60 mL/min   GFR calc Af Amer >60 >60 mL/min   Anion gap 10 5 - 15  Lipid panel  Result Value Ref Range   Cholesterol 136 0 - 200 mg/dL   Triglycerides 118 <150 mg/dL   HDL 29 (L) >40 mg/dL   Total CHOL/HDL Ratio 4.7 RATIO   VLDL 24 0 - 40 mg/dL   LDL Cholesterol 83 0 - 99 mg/dL      Assessment & Plan:   Encounter Diagnoses  Name Primary?  . Essential hypertension Yes  . Hyperlipidemia, unspecified hyperlipidemia type   . Tobacco use disorder   . Chronic obstructive pulmonary disease, unspecified COPD type ([place])   . Dermatitis       -Reviewed labs with pt  -pt to continue current medications -gave rx hydrocortisone to use on LE to help clear up dermatitis.  Pt is encouraged to avoid scratching.   -encouraged smoking cessation -pt to follow up 3 months.  He is to contact  office sooner prn

## 2020-02-18 ENCOUNTER — Telehealth: Payer: Self-pay | Admitting: Orthopaedic Surgery

## 2020-02-18 MED ORDER — OXYCODONE-ACETAMINOPHEN 5-325 MG PO TABS: 1.0000 | ORAL_TABLET | Freq: Four times a day (QID) | ORAL | 0 refills | Status: AC | PRN

## 2020-02-18 NOTE — Telephone Encounter (Signed)
Patient requests refill on Oxycodone/Acetaminophe 5-325  Mgs.  Qty  56  Sig: Take 1 tablet by mouth every 6 (six) hours as needed for up to 14 days for severe pain.  Patient uses [facility] in [place]

## 2020-02-18 NOTE — Telephone Encounter (Signed)
Set him up to see Dr. Romeo Apple.  He needs a total hip.

## 2020-02-18 NOTE — Telephone Encounter (Signed)
Patient now has the [employer]% [employer].  Should we go ahead and schedule him as a surgical consult with Dr. Romeo Apple or just wait until after he sees you again on the 13th?  Please let me know  Thanks

## 2020-02-26 ENCOUNTER — Ambulatory Visit (INDEPENDENT_AMBULATORY_CARE_PROVIDER_SITE_OTHER): Payer: Self-pay | Admitting: Orthopedic Surgery

## 2020-02-26 ENCOUNTER — Ambulatory Visit: Payer: Self-pay

## 2020-02-26 ENCOUNTER — Other Ambulatory Visit: Payer: Self-pay

## 2020-02-26 ENCOUNTER — Encounter: Payer: Self-pay | Admitting: Orthopedic Surgery

## 2020-02-26 ENCOUNTER — Ambulatory Visit: Payer: Self-pay | Admitting: Orthopaedic Surgery

## 2020-02-26 VITALS — BP 109/73 | HR 92 | Temp 97.7°F | Wt 191.0 lb

## 2020-02-26 DIAGNOSIS — M795 Residual foreign body in soft tissue: Secondary | ICD-10-CM

## 2020-02-26 MED ORDER — NICOTINE 21 MG/24HR TD PT24: 21.0000 mg | MEDICATED_PATCH | Freq: Every day | TRANSDERMAL | 0 refills | Status: DC

## 2020-02-26 NOTE — Progress Notes (Signed)
PREOP CONSULT  Chief Complaint  Patient presents with  . 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  The patient also has C OPD, he is a 2 pack/day smoker down from 4 packs/day.  He has hypertension.  He takes Norvasc Zestril and Prilosec along with Lipitor Celexa and Flomax  He says he is having more pain and difficulty walking and is limping is getting worse   Review of Systems  HENT: Positive for tinnitus.   Respiratory: Positive for wheezing.   Musculoskeletal:       Denies back pain but does have some left-sided posterior iliac crest pain and says that he has hip knee and ankle hurt although the groin and anterior thigh pain are the worst.    All other systems reviewed and are negative.    Past Medical History:  Diagnosis Date  . Acid reflux   . COPD (chronic obstructive pulmonary disease) (HCC)   . Depression   . Hypertension   . Kidney stone   . Stroke [facility])     Past Surgical History:  Procedure Laterality Date  . CHOLECYSTECTOMY      Family History  Problem Relation Age of Onset  . Epilepsy Mother   . Hypertension Father   . Heart disease Father   . Hyperlipidemia Father   . Diabetes Father    Social History   Tobacco Use  . Smoking status: Current Every Day Smoker    Packs/day: 1.00    Years: 40.00    Pack years: 40.00    Types: Cigarettes  . Smokeless tobacco: Never Used  . Tobacco comment: pack a day was two packs before stroke  Substance Use Topics  . Alcohol use: Not Currently    Alcohol/week: 0.0 standard drinks    Comment: none since 2004. previously heavy drinker  . Drug use: Not Currently    Types: Marijuana, Cocaine    Comment: last use 08/11/2018    Allergies  Allergen Reactions  . Morphine And Related     Altered mental  status  . Penicillins Hives    Has patient had a PCN reaction causing immediate rash, facial/tongue/throat swelling, SOB or lightheadedness with hypotension: Yes Has patient had a PCN reaction causing severe rash involving mucus membranes or skin necrosis: No Has patient had a PCN reaction that required hospitalization: No Has patient had a PCN reaction occurring within the last 10 years: No If all of the above answers are "NO", then may proceed with Cephalosporin use.      Current Meds  Medication Sig  . albuterol (PROVENTIL HFA) 108 (90 Base) MCG/ACT inhaler INHALE 2 PUFFS BY MOUTH EVERY 6 HOURS AS NEEDED FOR COUGHING, WHEEZING, OR SHORTNESS OF BREATH  . amLODipine (NORVASC) 5 MG tablet Take 1 tablet (5 mg total) by mouth daily.  Marland Kitchen aspirin EC 81 MG EC tablet Take 1 tablet (81 mg total) by mouth daily.  Marland Kitchen atorvastatin (LIPITOR) 40 MG tablet TAKE 1 Tablet BY MOUTH ONCE DAILY AT 6 PM  . citalopram (CELEXA) 20 MG tablet Take 1 tablet (20 mg total) by mouth daily.  . hydrocortisone 1 % ointment Apply 1 application topically 2 (two) times daily.  Marland Kitchen lisinopril (ZESTRIL) 20 MG tablet Take 1 tablet (20 mg total) by mouth daily.  Marland Kitchen omeprazole (  PRILOSEC) 20 MG capsule TAKE 1 Capsule  BY MOUTH TWICE DAILY BEFORE MEALS  . oxyCODONE-acetaminophen (PERCOCET/ROXICET) 5-325 MG tablet Take 1 tablet by mouth every 6 (six) hours as needed for up to 14 days for severe pain. One tablet every six hours for pain. 14 day limit  . predniSONE (DELTASONE) 50 MG tablet Take 1 tablet (50 mg total) by mouth daily.  . tamsulosin (FLOMAX) 0.4 MG CAPS capsule Take 1 capsule (0.4 mg total) by mouth at bedtime.    BP 109/73   Pulse 92   Temp 97.7 F (36.5 C)   Wt 191 lb (86.6 kg)   BMI 28.21 kg/m   Physical Exam Vitals and nursing note reviewed.  Constitutional:      Appearance: Normal appearance.  Neurological:     Mental Status: He is alert and oriented to person, place, and time.  Psychiatric:        Mood  and Affect: Mood normal.     Ortho Exam   Leg lengths are equal  Right lower extremity:  Skin normal. Tenderness none Range of motion normal Instability none Muscle tone and strength normal Sensation normal No edema  Left lower extremity:  Skin normal Tenderness he has some lower back tenderness on the left side Hip flexion 120 degrees becomes painful and then internal rotation 20 degrees is painful There is no instability In the standing position he had good abduction strength No muscle atrophy Sensation normal No peripheral edema MEDICAL DECISION SECTION  xrays ordered? no  My independent reading of xrays: X-rays were done in the office on Mar 10, 2020 AP pelvis Right hip looks normal  Left hip shows narrowing superior acetabulum near the weightbearing dome his head neck offset is slightly diminished, no signs of avascular necrosis  Encounter Diagnosis  Name Primary?  . Foreign body (FB) in soft tissue Yes     PLAN:   Primary care doctor: [facility] assessments:  Social setting, home setting: 1 floor there is a ramp to get into the house and he has family members who can stay with him after surgery  Risk of surgery: Bleeding infection risk of amputation DVT pulmonary embolus stiffness continued postop pain  Alternative treatments: Continue with nonoperative care such as but not limited to Tylenol anti-inflammatories tramadol topical medications bracing chronic pain management  Patient factors that increase risk: COPD, 2 pack/day smoking, hemiplegia left lower extremity  The patient has been advised to stop smoking we ordered a patch for him.  He says he tried it once before and was able to stop for approximately 2 weeks but then started up again once she was with other people who are smoking.  We also discussed things that would increase his risk for postop complication including his COPD, his smoking which can lead to respiratory  depression with the postop pain medications as well as increase infection risk because of smoking and his risk of dislocation due to the prior CVA with left-sided hemiplegia  He is a candidate for possible dual mobility cup or may be fine with just an anterior approach with a less likelihood of dislocation   Referral to Dr Rush Farmer   Meds ordered this encounter  Medications  . nicotine (NICODERM CQ - DOSED IN MG/24 HOURS) 21 mg/24hr patch    Sig: Place 1 patch (21 mg total) onto the skin daily.    Dispense:  28 patch    Refill:  0    Arther Abbott, MD  02/26/2020 2:57 PM

## 2020-02-26 NOTE — Patient Instructions (Signed)
Referral to Dr Rayburn Ma

## 2020-02-27 ENCOUNTER — Telehealth: Payer: Self-pay | Admitting: Radiology

## 2020-02-27 DIAGNOSIS — M25552 Pain in left hip: Secondary | ICD-10-CM

## 2020-02-27 NOTE — Telephone Encounter (Signed)
-----   Message from Vickki Hearing, MD sent at 02/26/2020  2:51 PM EDT ----- Referral to Dr Rayburn Ma

## 2020-03-04 ENCOUNTER — Ambulatory Visit: Payer: Self-pay

## 2020-03-04 ENCOUNTER — Ambulatory Visit: Payer: Self-pay | Attending: Internal Medicine

## 2020-03-04 DIAGNOSIS — Z23 Encounter for immunization: Secondary | ICD-10-CM

## 2020-03-04 NOTE — Progress Notes (Signed)
   Covid-19 Vaccination Clinic  Name:  KODY BRANDL    MRN: [id number] DOB: 07-22-1961  03/04/2020  Mr. Jewel was observed post Covid-19 immunization for 15 minutes without incident. He was provided with Vaccine Information Sheet and instruction to access the V-Safe system.   Mr. Busch was instructed to call 911 with any severe reactions post vaccine: Marland Kitchen Difficulty breathing  . Swelling of face and throat  . A fast heartbeat  . A bad rash all over body  . Dizziness and weakness   Immunizations Administered    Name Date Dose VIS Date Route   Moderna COVID-19 Vaccine 03/04/2020  9:09 AM 0.5 mL 09/2019 Intramuscular   Manufacturer: Moderna   Lot: [id number]   NDC: 74715-953-96

## 2020-03-05 ENCOUNTER — Other Ambulatory Visit: Payer: Self-pay

## 2020-03-05 ENCOUNTER — Ambulatory Visit (INDEPENDENT_AMBULATORY_CARE_PROVIDER_SITE_OTHER): Payer: Self-pay | Admitting: Orthopaedic Surgery

## 2020-03-05 ENCOUNTER — Encounter: Payer: Self-pay | Admitting: Orthopaedic Surgery

## 2020-03-05 VITALS — Ht 69.0 in | Wt 192.0 lb

## 2020-03-05 DIAGNOSIS — M1612 Unilateral primary osteoarthritis, left hip: Secondary | ICD-10-CM

## 2020-03-05 MED ORDER — TRAMADOL HCL 50 MG PO TABS: 100.0000 mg | ORAL_TABLET | Freq: Four times a day (QID) | ORAL | 0 refills | Status: DC | PRN

## 2020-03-05 NOTE — Progress Notes (Signed)
Office Visit Note   Patient: [name]           Date of Birth: 1961-09-01           MRN: [id number] Visit Date: 03/05/2020              Requested by: Jacquelin Hawking, PA-C [address] [place],  [place] [address] PCP: Jacquelin Hawking, PA-C   Assessment & Plan: Visit Diagnoses:  1. Unilateral primary osteoarthritis, left hip     Plan: Given the severity of the patient's left hip pain combined with his x-ray findings and clinical exam findings, we are recommending a hip replacement surgery at this standpoint.  There is no other conservative treatment measures that will work at this standpoint given the nature of the disease of his left hip.  I had a long thorough discussion about hip replacement surgery.  I have advocated him stopping smoking.  I did explain in detail what hip replacement surgery involves and gave a handout about it.  We talked about the risk and benefits of surgery in detail as well as what to expect with an interoperative and postoperative course.  He is interested in having this scheduled.  I will send in some tramadol to help with his acute pain and work on getting his surgery scheduled.  All questions and concerns were answered and addressed.  Follow-Up Instructions: Return for 2 weeks post-op.   Orders:  No orders of the defined types were placed in this encounter.  No orders of the defined types were placed in this encounter.     Procedures: No procedures performed   Clinical Data: No additional findings.   Subjective: Chief Complaint  Patient presents with  . Left Hip - Pain  The patient is a very pleasant 59 year old gentleman who is referred to me to evaluate and treat severe arthritis of his left hip.  He has been dealing with left hip pain for some time now.  He has had to avoid any type of treatment for this hip given the stroke that he had over a year ago.  He is now just on aspirin a day.  He is working on trying to stop smoking.  He still  smokes daily but is trying to quit.  He is not a diabetic and is a thin individual.  He has debilitating left hip pain that is gotten worse over the last 4 weeks.  He hurts in the groin.  This become a constant pain and is detrimentally affecting his mobility, his activities day living, and his quality of life.  The pain is 10 out of 10.  X-rays are on the canopy system for me to review but they do show severe end-stage arthritis of the left hip.  He does have a history of COPD.  HPI  Review of Systems Today he denies any headache, chest pain, shortness of breath, fever, chills, nausea, vomiting  Objective: Vital Signs: Ht 5\' 9"  (1.753 m)   Wt 192 lb (87.1 kg)   BMI 28.35 kg/m   Physical Exam He is alert and oriented x3 and in no acute distress.  He does smell of tobacco. Ortho Exam Examination of his right hip is normal.  Examination of his left hip shows severe pain with any attempts of internal and external rotation.  There is significant stiffness with rotation as well. Specialty Comments:  No specialty comments available.  Imaging: No results found. X-rays on the canopy system independently reviewed of the  pelvis and left hip shows severe end-stage arthritis of the left hip.  There is significant loss of the superior lateral joint space with sclerotic changes and periarticular osteophytes.  PMFS History: Patient Active Problem List   Diagnosis Date Noted  . Unilateral primary osteoarthritis, left hip 03/05/2020  . COPD (chronic obstructive pulmonary disease) ([place]) 04/11/2019  . 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 Labile blood pressure   . CKD (chronic kidney disease), stage II   . Benign prostatic hyperplasia   . Hyperlipidemia 08/14/2018  . Tobacco use disorder 08/14/2018  . Obesity 08/14/2018  . GERD (gastroesophageal reflux disease) 08/14/2018  . Leg cramp, left 08/14/2018  . Diastolic dysfunction   . Polysubstance abuse ([place])   . 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 Hypertension 02/17/2014   Past Medical History:  Diagnosis Date  . Acid reflux   . COPD (chronic obstructive pulmonary disease) ([place])   . Depression   . Hypertension   . Kidney stone   . Stroke [facility])     Family History  Problem Relation Age of Onset  . Epilepsy Mother   . Hypertension Father   . Heart disease Father   . Hyperlipidemia Father   . Diabetes Father     Past Surgical History:  Procedure Laterality Date  . CHOLECYSTECTOMY     Social History   Occupational History  . Not on file  Tobacco Use  . Smoking status: Current Every Day Smoker    Packs/day: 1.00    Years: 40.00    Pack years: 40.00    Types: Cigarettes  . Smokeless tobacco: Never Used  . Tobacco comment: pack a day was two packs before stroke  Substance and Sexual Activity  . Alcohol use: Not Currently    Alcohol/week: 0.0 standard drinks    Comment: none since 2004. previously heavy drinker  . Drug use: Not Currently    Types: Marijuana, Cocaine    Comment: last use 08/11/2018  . Sexual activity: Not on file

## 2020-03-06 ENCOUNTER — Ambulatory Visit: Payer: Self-pay | Admitting: Orthopaedic Surgery

## 2020-03-20 ENCOUNTER — Telehealth: Payer: Self-pay

## 2020-03-20 ENCOUNTER — Other Ambulatory Visit: Payer: Self-pay | Admitting: Orthopaedic Surgery

## 2020-03-20 MED ORDER — TRAMADOL HCL 50 MG PO TABS: 100.0000 mg | ORAL_TABLET | Freq: Four times a day (QID) | ORAL | 0 refills | Status: DC | PRN

## 2020-03-20 NOTE — Telephone Encounter (Signed)
Patient requests refill of Tramadol 

## 2020-03-21 ENCOUNTER — Other Ambulatory Visit: Payer: Self-pay | Admitting: Orthopaedic Surgery

## 2020-04-08 ENCOUNTER — Telehealth: Payer: Self-pay | Admitting: Orthopaedic Surgery

## 2020-04-08 NOTE — Telephone Encounter (Signed)
Pt called to get the location for his covid screening; I did let him speak with Sherri and just wanted to keep Dr. Magnus Ivan updated.

## 2020-04-14 ENCOUNTER — Other Ambulatory Visit: Payer: Self-pay

## 2020-04-16 ENCOUNTER — Other Ambulatory Visit: Payer: Self-pay | Admitting: Physician Assistant

## 2020-04-18 NOTE — Progress Notes (Signed)
[facility] [address], [place] - [address] [place] [address] Phone: [phone] Fax: [phone]  [facility] [place], [place] - [address] [place] [address] [place] [address] [place] [address] Phone: [phone] Fax: [phone]      Your procedure is scheduled on Tuesday, July 6th.  Report to [facility]A" at 12:00 P.M., and check in at the Admitting office.  Call this number if you have problems the morning of surgery:  [phone]  Call [phone] if you have any questions prior to your surgery date Monday-Friday 8am-4pm    Remember:  Do not eat after midnight the night before your surgery  You may drink clear liquids until 11:00 AM the morning of your surgery.   Clear liquids allowed are: Water, Non-Citrus Juices (without pulp), Carbonated Beverages, Clear Tea, Black Coffee Only, and Gatorade    Take these medicines the morning of surgery with A SIP OF WATER   Albuterol inhaler - if needed  Amlodipine (Norvasc)  Citalopram (Celexa)  Omeprazole (Prilosec)  Follow your surgeon's instructions on when to stop Aspirin.  If no instructions were given by your surgeon then you will need to call the office to get those instructions.    As of today, STOP taking any Aspirin containing products, Aleve, Naproxen, Ibuprofen, Motrin, Advil, Goody's, BC's, all herbal medications, fish oil, and all vitamins.                      Do not wear jewelry            Do not wear lotions, powders, colognes, or deodorant.            Men may shave face and neck.            Do not bring valuables to the hospital.            [facility] is not responsible for any belongings or valuables.  Do NOT Smoke (Tobacco/Vapping) or drink Alcohol 24 hours prior to your procedure If you use a CPAP at night, you may bring all equipment for your overnight stay.   Contacts, glasses, dentures or bridgework may not be worn into  surgery.      For patients admitted to the hospital, discharge time will be determined by your treatment team.   Patients discharged the day of surgery will not be allowed to drive home, and someone needs to stay with them for 24 hours.    Special instructions:   [place]- Preparing For Surgery  Before surgery, you can play an important role. Because skin is not sterile, your skin needs to be as free of germs as possible. You can reduce the number of germs on your skin by washing with CHG (chlorahexidine gluconate) Soap before surgery.  CHG is an antiseptic cleaner which kills germs and bonds with the skin to continue killing germs even after washing.    Oral Hygiene is also important to reduce your risk of infection.  Remember - BRUSH YOUR TEETH THE MORNING OF SURGERY WITH YOUR REGULAR TOOTHPASTE  Please do not use if you have an allergy to CHG or antibacterial soaps. If your skin becomes reddened/irritated stop using the CHG.  Do not shave (including legs and underarms) for at least 48 hours prior to first CHG shower. It is OK to shave your face.  Please follow these instructions carefully.   1. Shower  the NIGHT BEFORE SURGERY and the MORNING OF SURGERY with CHG Soap.   2. If you chose to wash your hair, wash your hair first as usual with your normal shampoo.  3. After you shampoo, rinse your hair and body thoroughly to remove the shampoo.  4. Use CHG as you would any other liquid soap. You can apply CHG directly to the skin and wash gently with a scrungie or a clean washcloth.   5. Apply the CHG Soap to your body ONLY FROM THE NECK DOWN.  Do not use on open wounds or open sores. Avoid contact with your eyes, ears, mouth and genitals (private parts). Wash Face and genitals (private parts)  with your normal soap.   6. Wash thoroughly, paying special attention to the area where your surgery will be performed.  7. Thoroughly rinse your body with warm water from the neck  down.  8. DO NOT shower/wash with your normal soap after using and rinsing off the CHG Soap.  9. Pat yourself dry with a CLEAN TOWEL.  10. Wear CLEAN PAJAMAS to bed the night before surgery, wear comfortable clothes the morning of surgery  11. Place CLEAN SHEETS on your bed the night of your first shower and DO NOT SLEEP WITH PETS.   Day of Surgery:   Do not apply any deodorants/lotions.  Please wear clean clothes to the hospital/surgery center.   Remember to brush your teeth WITH YOUR REGULAR TOOTHPASTE.   Please read over the following fact sheets that you were given.

## 2020-04-21 ENCOUNTER — Other Ambulatory Visit: Payer: Self-pay

## 2020-04-21 ENCOUNTER — Encounter (HOSPITAL_COMMUNITY): Payer: Self-pay

## 2020-04-21 ENCOUNTER — Encounter (HOSPITAL_COMMUNITY)
Admission: RE | Admit: 2020-04-21 | Discharge: 2020-04-21 | Disposition: A | Discharge status: Home / Self Care | Payer: Self-pay | Source: Ambulatory Visit | Attending: Orthopaedic Surgery | Admitting: Orthopaedic Surgery

## 2020-04-21 DIAGNOSIS — Z01818 Encounter for other preprocedural examination: Secondary | ICD-10-CM | POA: Insufficient documentation

## 2020-04-21 HISTORY — DX: Unspecified osteoarthritis, unspecified site: M19.90

## 2020-04-21 LAB — COMPREHENSIVE METABOLIC PANEL
ALT: 15 U/L (ref 0–44)
AST: 16 U/L (ref 15–41)
Albumin: 3.2 g/dL — ABNORMAL LOW (ref 3.5–5.0)
Alkaline Phosphatase: 79 U/L (ref 38–126)
Anion gap: 10 (ref 5–15)
BUN: 19 mg/dL (ref 6–20)
CO2: 25 mmol/L (ref 22–32)
Calcium: 8.5 mg/dL — ABNORMAL LOW (ref 8.9–10.3)
Chloride: 101 mmol/L (ref 98–111)
Creatinine, Ser: 1.16 mg/dL (ref 0.61–1.24)
GFR calc Af Amer: >60 mL/min (ref >60)
GFR calc non Af Amer: >60 mL/min (ref >60)
Glucose, Bld: 116 mg/dL — ABNORMAL HIGH (ref 70–99)
Potassium: 3.3 mmol/L — ABNORMAL LOW (ref 3.5–5.1)
Sodium: 136 mmol/L (ref 135–145)
Total Bilirubin: 0.6 mg/dL (ref 0.3–1.2)
Total Protein: 6.9 g/dL (ref 6.5–8.1)

## 2020-04-21 LAB — TYPE AND SCREEN
ABO/RH(D): O POS
Antibody Screen: NEGATIVE

## 2020-04-21 LAB — SURGICAL PCR SCREEN
MRSA, PCR: NEGATIVE
Staphylococcus aureus: POSITIVE — AB

## 2020-04-21 LAB — CBC
HCT: 38.2 % — ABNORMAL LOW (ref 39.0–52.0)
Hemoglobin: 12.6 g/dL — ABNORMAL LOW (ref 13.0–17.0)
MCH: 30.1 pg (ref 26.0–34.0)
MCHC: 33 g/dL (ref 30.0–36.0)
MCV: 91.4 fL (ref 80.0–100.0)
Platelets: 232 10*3/uL (ref 150–400)
RBC: 4.18 MIL/uL — ABNORMAL LOW (ref 4.22–5.81)
RDW: 14.2 % (ref 11.5–15.5)
WBC: 9.4 10*3/uL (ref 4.0–10.5)
nRBC: 0 % (ref 0.0–0.2)

## 2020-04-21 NOTE — Progress Notes (Signed)
[facility] [address], [place] - [address] [place] [address] Phone: [phone] Fax: [phone]  [facility], [place] - [address] [place] [address], [place] [address] [place] [address] Phone: [phone] Fax: [phone]      Your procedure is scheduled on Tuesday, July 6th.  Report to [facility]" at 12:00 P.M., and check in at the Admitting office.  Call this number if you have problems the morning of surgery:  [phone]  Call [phone] if you have any questions prior to your surgery date Monday-Friday 8am-4pm    Remember:  Do not eat after midnight the night before your surgery  You may drink clear liquids until 11:00 AM the morning of your surgery.   Clear liquids allowed are: Water, Non-Citrus Juices (without pulp), Carbonated Beverages, Clear Tea, Black Coffee Only, and Gatorade.   Enhanced Recovery after Surgery for Orthopedics Enhanced Recovery after Surgery is a protocol used to improve the stress on your body and your recovery after surgery.  Patient Instructions  . The night before surgery:  o No food after midnight. ONLY clear liquids after midnight  .  Marland Kitchen The day of surgery (if you do NOT have diabetes):  o Drink ONE (1) Pre-Surgery Clear Ensure as directed.  Marland Kitchen o Finish the drink by 11:00 a.m. o Nothing else to drink after completing the  Pre-Surgery Clear Ensure.         If you have questions, please contact your surgeon's office.     Take these medicines the morning of surgery with A SIP OF WATER   Albuterol inhaler - if needed  Amlodipine (Norvasc)  Citalopram (Celexa)  Omeprazole (Prilosec)  traMADol (ULTRAM) -if needed for pain  Follow your surgeon's instructions on when to stop Aspirin.  If no instructions were given by your surgeon then you will need to call the office to get those instructions.    As of today, STOP taking any Aspirin containing  products, Aleve, Naproxen, Ibuprofen, Motrin, Advil, Goody's, BC's, all herbal medications, fish oil, and all vitamins.                      Do not wear jewelry            Do not wear lotions, powders, colognes, or deodorant.            Men may shave face and neck.            Do not bring valuables to the hospital.            [facility] is not responsible for any belongings or valuables.  Do NOT Smoke (Tobacco/Vapping) or drink Alcohol 24 hours prior to your procedure If you use a CPAP at night, you may bring all equipment for your overnight stay.   Contacts, glasses, dentures or bridgework may not be worn into surgery.      For patients admitted to the hospital, discharge time will be determined by your treatment team.   Patients discharged the day of surgery will not be allowed to drive home, and someone needs to stay with them for 24 hours.    Special instructions:   [place]- Preparing For Surgery  Before surgery, you can play an important role. Because skin is not sterile, your skin needs to be as free of germs as possible. You can reduce the number of germs on your  skin by washing with CHG (chlorahexidine gluconate) Soap before surgery.  CHG is an antiseptic cleaner which kills germs and bonds with the skin to continue killing germs even after washing.    Oral Hygiene is also important to reduce your risk of infection.  Remember - BRUSH YOUR TEETH THE MORNING OF SURGERY WITH YOUR REGULAR TOOTHPASTE  Please do not use if you have an allergy to CHG or antibacterial soaps. If your skin becomes reddened/irritated stop using the CHG.  Do not shave (including legs and underarms) for at least 48 hours prior to first CHG shower. It is OK to shave your face.  Please follow these instructions carefully.   1. Shower the NIGHT BEFORE SURGERY and the MORNING OF SURGERY with CHG Soap.   2. If you chose to wash your hair, wash your hair first as usual with your normal  shampoo.  3. After you shampoo, rinse your hair and body thoroughly to remove the shampoo.  4. Use CHG as you would any other liquid soap. You can apply CHG directly to the skin and wash gently with a scrungie or a clean washcloth.   5. Apply the CHG Soap to your body ONLY FROM THE NECK DOWN.  Do not use on open wounds or open sores. Avoid contact with your eyes, ears, mouth and genitals (private parts). Wash Face and genitals (private parts)  with your normal soap.   6. Wash thoroughly, paying special attention to the area where your surgery will be performed.  7. Thoroughly rinse your body with warm water from the neck down.  8. DO NOT shower/wash with your normal soap after using and rinsing off the CHG Soap.  9. Pat yourself dry with a CLEAN TOWEL.  10. Wear CLEAN PAJAMAS to bed the night before surgery, wear comfortable clothes the morning of surgery  11. Place CLEAN SHEETS on your bed the night of your first shower and DO NOT SLEEP WITH PETS.   Day of Surgery:   Do not apply any deodorants/lotions.  Please wear clean clothes to the hospital/surgery center.   Remember to brush your teeth WITH YOUR REGULAR TOOTHPASTE.   Please read over the following fact sheets that you were given.

## 2020-04-21 NOTE — Progress Notes (Addendum)
Anesthesia PAT Evaluation:  Case: [id number] Date/Time: 04/29/20 1343   Procedure: LEFT TOTAL HIP ARTHROPLASTY ANTERIOR APPROACH (Left Hip)   Anesthesia type: Spinal   Pre-op diagnosis: osteoarthritis left hip   Location: [place] OR ROOM 17 / [place] OR   Surgeons: Mcarthur Rossetti, MD      DISCUSSION: Patient is a 59 year old male scheduled for the above procedure.  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 BMI is consistent with obesity.   Seen by Barry Dryer, PA-C on 02/14/20 and noted patient hoping to get THR soon.  Patient evaluated during his PAT visit given chronic dyspnea and mild tachycardia. EKG showed ST at 105 bpm, QRS widening, new LAD and T wave inversion in aVL--septal infarct appears old. (Comparison tracing from 2013 received after he had left PAT.). He denied chest pain. No known CAD/MI or CHF. He father had "heart issues" in his 67's (unsure details, but did have eventual CABG) but died of lung cancer at age 4 years old. Patient reported normal cardiac testing at [facility]now [facility] in 2013. Normal LVEF by 2019 echo done following CVA. He is not followed by a cardiologist. Patient currently smokes 1PPD (down from ~ 2PPD 6 months ago, but at one point had been smoking 4PPD). 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 He works at a [employer].  He reports chronic exertional dyspnea, worse over the past few years, but stable for the past 6 months. Activity tolerance has gradually decreased over the years, but worse in the past 4 months due to severe left hip pain. He is not on home O2 and has not required hospitalization for COPD exacerbations/URI/PNA. He uses albuterol MDI as needed. He does not see a pulmonologist. He denied dyspnea with light activities such as walking in his house, but he has  to stop and rest when walking up one flight of stairs. He has a hard time sleeping (very restless), but he thinks he could tolerate lying flat or mostly flat if spinal anesthesia used. He has chronic LE edema, worse on RLE since since 2019 CVA. He uses hydrocortisone cream to his BLE for dermatitis (has itching with excoriations which he and his wife say are chronic). He has some mild left sided weakness since his CVA and uses a cane at time to ambulate.  Currently, he is in a lot of pain fro his left hip and put his head down several times during evaluation, reportedly due to his hip pain.  He remained mildly tachycardic during his hour long PAT visit--says he walked all the way from the parking deck to PAT department. No chest pain or syncope.   2013 records received from [facility]. EKG changes since 2013 noted above. Records received showed an equivocal ETT (due to 22mm upsloping ST segment depression in V5-6 at peak HR). Discussed case with anesthesiologist Hoy Morn, MD. Patient with multiple CAD risk factors (including family history, smoking, HTN, CVA) with limited exercise ability, exertional dyspnea, some EKG changes since 2013, so recommend preoperative cardiology evaluation. I will notify surgeon.    2nd Moderna COVID-19 vaccine 03/04/20. Presurgical COVID-19 test is scheduled for 04/26/20.    ADDENDUM 04/24/20 4:53: [name] was evaluated by cardiologist Eleonore Chiquito, MD. (Copies of 2013 ETT sent to [facility] prior to visit.). He wrote: "Preoperative cardiovascular examination -The Revised Cardiac  Risk Index = 1 (CVA) which equates to low (0.9%) estimated risk of perioperative myocardial infarction, pulmonary edema, ventricular fibrillation, cardiac arrest, or complete heart block.  -He reports he does get short of breath with his COPD.  He denies angina today.  His EKG demonstrates LVH.  He does have a 2 out of 6 holosystolic murmur which is suggestive of mitral  vegetation.  This murmur is mild.  I do not suspect he has severe valvular heart disease. -He can complete greater than 4 METS without any major limitations. -I see no need to delay surgery.  He may proceed at acceptable risk.  My risk assessment does not account for his lungs.  I suspect he will be much higher risk due to COPD and ongoing tobacco abuse.  He has 40-year pack history reported. -No evidence of heart failure on exam.  And there is no symptoms concerning for underlying obstructive CAD. -Our service is available as needed in the peri-operative period." (He notes that surgery does not need to be delayed to get the echocardiogram, which appears to be schedueld for 05/16/20.) Smoking cessation advised.    VS: BP (!) 156/86   Pulse (!) 108   Temp 37.9 C (Oral)   Resp 20   Ht 5\' 5"  (1.651 m)   Wt 84.6 kg   SpO2 98%   BMI 31.02 kg/m    PROVIDERS: , PA-C is PCP ([facility] [place])   LABS: Labs reviewed: Acceptable for surgery. (all labs ordered are listed, but only abnormal results are displayed)  Labs Reviewed  SURGICAL PCR SCREEN - Abnormal; Notable for the following components:      Result Value   Staphylococcus aureus POSITIVE (*)    All other components within normal limits  COMPREHENSIVE METABOLIC PANEL - Abnormal; Notable for the following components:   Potassium 3.3 (*)    Glucose, Bld 116 (*)    Calcium 8.5 (*)    Albumin 3.2 (*)    All other components within normal limits  CBC - Abnormal; Notable for the following components:   RBC 4.18 (*)    Hemoglobin 12.6 (*)    HCT 38.2 (*)    All other components within normal limits  TYPE AND SCREEN     IMAGES: CTA Head/neck 08/11/18: IMPRESSION: CTA neck: 1. Patent bilateral carotid systems and right vertebral artery without hemodynamically significant stenosis by NASCET criteria, dissection, or aneurysm. 2. 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 CTA head: 1. No large vessel occlusion, high-grade stenosis, or aneurysm. 2. Mild atherosclerosis of carotid siphons and the basilar artery without significant stenosis. CT brain perfusion: Normal CT brain perfusion.   EKG:  According to 04/24/20 cardiology note by Dr. 06/25/20: "EKG is ordered today.  The ekg ordered today demonstrates normal sinus rhythm, heart rate 94, LVH noted, and was personally reviewed by me." (Tracing is not yet viewable in [facility] Epic.)  EKG 04/21/20: Sinus tachycardia at 105 bpm Left axis deviation Left ventricular hypertrophy with QRS widening and repolarization abnormality ( R in aVL , Cornell product ) Cannot rule out Septal infarct , age undetermined Abnormal ECG Confirmed by 04/23/20 [phone]) on 04/21/2020 4:46:47 PM - When compared to 01/24/12 tracing, rate is faster, QRS has widened, LAD and negative T wave in aVL are new.  Comparison EKG from 01/24/12 ([facility]) showed:  Sinus rhythm Probable  left atrial enlargement Probable anteroseptal infarct, old.   CV: Echo 08/11/18: Study Conclusions  - Left ventricle: The cavity size was normal. Wall thickness was  normal. Systolic function was normal. The estimated ejection  fraction was in the range of 60% to 65%. Wall motion was normal;  there were no regional wall motion abnormalities. Doppler  parameters are consistent with abnormal left ventricular  relaxation (grade 1 diastolic dysfunction). The E/e&' ratio is  between 8-15, suggesting indeterminate LV filling pressure.  - Mitral valve: Mildly thickened leaflets . There was trivial  regurgitation.  - Atrial septum: No defect or patent foramen ovale was identified.  - Inferior vena cava: The vessel was normal in size. The  respirophasic diameter changes were in the normal  range (>= 50%),  consistent with normal central venous pressure.  Impressions:  - LVEF 60-65%, normal wall thickness, normal wall motion, grade 1  DD, indeterminate LV filling pressure, trivial MR, normal IVC.   ETT 01/25/12 ([facility]): Baseline ECG: Sinus rhythm at 71 bpm with R prime V1 and borderline IVCD. Exercised 7:33 min using Standard Bruce exercise protocol, achieving stage 3 and a max METs of 10.1. MHR 146 bpm, 85% MPHR.  Stress ECG: Equivocal ST segment changes inferior leads. Greater than 1 mm upsloping ST segment depression in leads V5-6 at peak HR with rapid resolution. Conclusion:  Abnormal stress test. There was a normal HR response, with a hypertensive BP response. No chest pain. Symptoms noted during stress include: dyspnea. The test was terminated due to dyspnea and achievement of 85% MPHR. An adequate level of stress was achieved. Could consider stress testing coupled with imaging such as exercise echocardiography to further clarify the significance of ST segment changes.    Past Medical History:  Diagnosis Date  . Acid reflux   . Arthritis   . COPD (chronic obstructive pulmonary disease) (HCC)   . Depression   . Hypertension   . Kidney stone   . Stroke [facility])     Past Surgical History:  Procedure Laterality Date  . CHOLECYSTECTOMY      MEDICATIONS: . albuterol (PROVENTIL HFA) 108 (90 Base) MCG/ACT inhaler  . amLODipine (NORVASC) 5 MG tablet  . aspirin EC 81 MG EC tablet  . atorvastatin (LIPITOR) 40 MG tablet  . citalopram (CELEXA) 20 MG tablet  . hydrocortisone 1 % ointment  . lisinopril (ZESTRIL) 20 MG tablet  . nicotine (NICODERM CQ - DOSED IN MG/24 HOURS) 21 mg/24hr patch  . omeprazole (PRILOSEC) 20 MG capsule  . predniSONE (DELTASONE) 50 MG tablet  . tamsulosin (FLOMAX) 0.4 MG CAPS capsule  . traMADol (ULTRAM) 50 MG tablet   No current facility-administered medications for this encounter.  He is not currently on  prednisone.    Shonna Chock, PA-C Surgical Short Stay/Anesthesiology [facility] Phone [phone] [facility] Phone [phone] 04/21/2020 6:51 PM

## 2020-04-21 NOTE — Progress Notes (Signed)
PCP - Jacquelin Hawking PA-C Cardiologist - denies  PPM/ICD - N/A Device Orders -N/A  Rep Notified - N/A  Chest x-ray - N/A EKG - 04/21/20 Stress Test - 2013? ECHO - 08/11/18 Cardiac Cath - denies  Sleep Study - denies CPAP - denies  Blood Thinner Instructions: n/a Aspirin Instructions: No instructions given. Instructed patient to call Dr. Eliberto Ivory office to get those instructions.    ERAS Protcol -Protocol initiated. Pt instructed to stop clears by 1100 DOS. PRE-SURGERY Ensure or G2- (1) pre-surgical ensure given to pt.   COVID TEST- 04/26/20; pt educated on quarantine requirements.    Anesthesia review: Yes, ekg review.  Patient denies shortness of breath, fever, cough and chest pain at PAT appointment   All instructions explained to the patient, with a verbal understanding of the material. Patient agrees to go over the instructions while at home for a better understanding. Patient also instructed to self quarantine after being tested for COVID-19. The opportunity to ask questions was provided.    Coronavirus Screening  Have you experienced the following symptoms:  Cough yes/no: No Fever (>100.60F)  yes/no: No Runny nose yes/no: No Sore throat yes/no: No Difficulty breathing/shortness of breath  yes/no: No  Have you or a family member traveled in the last 14 days and where? yes/no: No   If the patient indicates "YES" to the above questions, their PAT will be rescheduled to limit the exposure to others and, the surgeon will be notified. THE PATIENT WILL NEED TO BE ASYMPTOMATIC FOR 14 DAYS.   If the patient is not experiencing any of these symptoms, the PAT nurse will instruct them to NOT bring anyone with them to their appointment since they may have these symptoms or traveled as well.   Please remind your patients and families that hospital visitation restrictions are in effect and the importance of the restrictions.

## 2020-04-24 ENCOUNTER — Ambulatory Visit (INDEPENDENT_AMBULATORY_CARE_PROVIDER_SITE_OTHER): Payer: Self-pay | Admitting: Cardiovascular Disease

## 2020-04-24 ENCOUNTER — Other Ambulatory Visit: Payer: Self-pay

## 2020-04-24 ENCOUNTER — Encounter: Payer: Self-pay | Admitting: Cardiovascular Disease

## 2020-04-24 VITALS — BP 126/74 | HR 94 | Ht 68.0 in | Wt 186.6 lb

## 2020-04-24 DIAGNOSIS — Z01810 Encounter for preprocedural cardiovascular examination: Secondary | ICD-10-CM

## 2020-04-24 DIAGNOSIS — R011 Cardiac murmur, unspecified: Secondary | ICD-10-CM

## 2020-04-24 DIAGNOSIS — Z720 Tobacco use: Secondary | ICD-10-CM

## 2020-04-24 NOTE — Progress Notes (Signed)
Cardiology Office Note:   Date:  04/24/2020  NAME:  Jabier GaussLeroy T Vences    MRN: [id number] DOB:  09/01/1961   PCP:  Jacquelin HawkingMcElroy, Shannon, PA-C  Cardiologist:  No primary care provider on file.   Referring MD: Jacquelin HawkingMcElroy, Shannon, PA-C   Chief Complaint  Patient presents with  . Pre-op Exam   History of Present Illness:   Jabier GaussLeroy T Callanan is a 59 y.o. male with a hx of COPD, arthritis who is being seen today for the evaluation of preoperative assessment at the request of Jacquelin HawkingMcElroy, Shannon, PA-C.  He presents for preop evaluation prior to hip surgery.  He plans to have his left hip replaced.  He is a current everyday smoker.  Smokes pack per day.  Is done so for years.  He is had a prior stroke in the past.  He is also had stress testing in 2013 that was normal.  He reports when he does exert himself he can get short of breath.  He denies any symptoms of angina or chest pain.  He reports that he can climb a flight of stairs without major limitations.  His EKG today demonstrates normal sinus rhythm with LVH.  He does have a history of hypertension.  His blood pressure is well controlled today.  His most recent with her profile shows a total cholesterol 136, HDL 29, LDL 83, triglycerides 118.  Is not diabetic with an A1c of 5.6.  There is no significant CKD here.  He is never had a heart attack.  There is no history of heart failure.  He does have some lower extremity edema but reports this is stable.  He denies symptoms of chest pain or shortness of breath today in office.  He reports he will have surgery next Tuesday.  Problem List 1. R pontine infarct (CVA) 2. COPD 3. HTN 4. Tobacco abuse   Past Medical History: Past Medical History:  Diagnosis Date  . Acid reflux   . Arthritis   . COPD (chronic obstructive pulmonary disease) (HCC)   . Depression   . Hypertension   . Kidney stone   . Stroke [facility](HCC)     Past Surgical History: Past Surgical History:  Procedure Laterality Date  . CHOLECYSTECTOMY       Current Medications: Current Meds  Medication Sig  . albuterol (PROVENTIL HFA) 108 (90 Base) MCG/ACT inhaler INHALE 2 PUFFS BY MOUTH EVERY 6 HOURS AS NEEDED FOR COUGHING, WHEEZING, OR SHORTNESS OF BREATH (Patient taking differently: Inhale 2 puffs into the lungs every 6 (six) hours as needed for wheezing or shortness of breath. )  . amLODipine (NORVASC) 5 MG tablet Take 1 tablet (5 mg total) by mouth daily.  Marland Kitchen. atorvastatin (LIPITOR) 40 MG tablet TAKE 1 Tablet BY MOUTH ONCE DAILY AT 6 PM (Patient taking differently: Take 40 mg by mouth daily at 6 PM. )  . citalopram (CELEXA) 20 MG tablet Take 1 tablet (20 mg total) by mouth daily.  . hydrocortisone 1 % ointment Apply 1 application topically 2 (two) times daily.  Marland Kitchen. lisinopril (ZESTRIL) 20 MG tablet Take 1 tablet (20 mg total) by mouth daily.  Marland Kitchen. omeprazole (PRILOSEC) 20 MG capsule TAKE 1 Capsule  BY MOUTH TWICE DAILY BEFORE MEALS (Patient taking differently: Take 20 mg by mouth 2 (two) times daily before a meal. )  . predniSONE (DELTASONE) 50 MG tablet Take 1 tablet (50 mg total) by mouth daily.  . tamsulosin (FLOMAX) 0.4 MG CAPS capsule Take 1 capsule (0.4 mg  total) by mouth at bedtime.  . traMADol (ULTRAM) 50 MG tablet TAKE 2 TABLETS BY MOUTH EVERY 6 HOURS AS NEEDED     Allergies:    Morphine and related and Penicillins   Social History: Social History   Socioeconomic History  . Marital status: Married    Spouse name: Not on file  . Number of children: 4  . Years of education: Not on file  . Highest education level: Not on file  Occupational History  . Not on file  Tobacco Use  . Smoking status: Current Every Day Smoker    Packs/day: 1.00    Years: 40.00    Pack years: 40.00    Types: Cigarettes  . Smokeless tobacco: Never Used  . Tobacco comment: pack a day was two packs before stroke  Vaping Use  . Vaping Use: Never used  Substance and Sexual Activity  . Alcohol use: Not Currently    Alcohol/week: 0.0 standard drinks     Comment: none since 2004. previously heavy drinker  . Drug use: Not Currently    Types: Marijuana    Comment: last use 08/11/2018. Uses marijuana daily.  Marland Kitchen Sexual activity: Not on file  Other Topics Concern  . Not on file  Social History Narrative  . Not on file   Social Determinants of Health   Financial Resource Strain:   . Difficulty of Paying Living Expenses:   Food Insecurity:   . Worried About Programme researcher, broadcasting/film/video in the Last Year:   . Barista in the Last Year:   Transportation Needs:   . Freight forwarder (Medical):   Marland Kitchen Lack of Transportation (Non-Medical):   Physical Activity:   . Days of Exercise per Week:   . Minutes of Exercise per Session:   Stress:   . Feeling of Stress :   Social Connections:   . Frequency of Communication with Friends and Family:   . Frequency of Social Gatherings with Friends and Family:   . Attends Religious Services:   . Active Member of Clubs or Organizations:   . Attends Banker Meetings:   Marland Kitchen Marital Status:      Family History: The patient's family history includes Diabetes in his father; Epilepsy in his mother; Heart disease in his father; Hyperlipidemia in his father; Hypertension in his father.  ROS:   All other ROS reviewed and negative. Pertinent positives noted in the HPI.     EKGs/Labs/Other Studies Reviewed:   The following studies were personally reviewed by me today:  EKG:  EKG is ordered today.  The ekg ordered today demonstrates normal sinus rhythm, heart rate 94, LVH noted, and was personally reviewed by me.   Recent Labs: 04/21/2020: ALT 15; BUN 19; Creatinine, Ser 1.16; Hemoglobin 12.6; Platelets 232; Potassium 3.3; Sodium 136   Recent Lipid Panel    Component Value Date/Time   CHOL 136 02/14/2020 0911   CHOL 244 (H) 02/15/2014 1630   TRIG 118 02/14/2020 0911   HDL 29 (L) 02/14/2020 0911   HDL 30 (L) 02/15/2014 1630   CHOLHDL 4.7 02/14/2020 0911   VLDL 24 02/14/2020 0911    LDLCALC 83 02/14/2020 0911   LDLCALC 168 (H) 02/15/2014 1630    Physical Exam:   VS:  BP 126/74   Pulse 94   Ht 5\' 8"  (1.727 m)   Wt 186 lb 9.6 oz (84.6 kg)   SpO2 96%   BMI 28.37 kg/m    Wt Readings from  Last 3 Encounters:  04/24/20 186 lb 9.6 oz (84.6 kg)  04/21/20 186 lb 6.4 oz (84.6 kg)  03/05/20 192 lb (87.1 kg)    General: Well nourished, well developed, in no acute distress Heart: Atraumatic, normal size  Eyes: PEERLA, EOMI  Neck: Supple, no JVD Endocrine: No thryomegaly Cardiac: Normal S1, S2; RRR; 2 out of 6 holosystolic murmur Lungs: Clear to auscultation bilaterally, no wheezing, rhonchi or rales  Abd: Soft, nontender, no hepatomegaly  Ext: No edema, pulses 2+ Musculoskeletal: No deformities, BUE and BLE strength normal and equal Skin: Warm and dry, no rashes   Neuro: Alert and oriented to person, place, time, and situation, CNII-XII grossly intact, no focal deficits  Psych: Normal mood and affect   ASSESSMENT:   OLIVER HEITZENRATER is a 59 y.o. male who presents for the following: 1. Preoperative cardiovascular examination   2. Murmur, cardiac   3. Tobacco abuse     PLAN:   1. Preoperative cardiovascular examination -The Revised Cardiac Risk Index = 1 (CVA) which equates to low (0.9%) estimated risk of perioperative myocardial infarction, pulmonary edema, ventricular fibrillation, cardiac arrest, or complete heart block.  -He reports he does get short of breath with his COPD.  He denies angina today.  His EKG demonstrates LVH.  He does have a 2 out of 6 holosystolic murmur which is suggestive of mitral vegetation.  This murmur is mild.  I do not suspect he has severe valvular heart disease. -He can complete greater than 4 METS without any major limitations. -I see no need to delay surgery.  He may proceed at acceptable risk.  My risk assessment does not account for his lungs.  I suspect he will be much higher risk due to COPD and ongoing tobacco abuse.  He has 40-year  pack history reported. -No evidence of heart failure on exam.  And there is no symptoms concerning for underlying obstructive CAD. -Our service is available as needed in the peri-operative period.    2. Murmur, cardiac -2 out of 6 holosystolic murmur.  Suspect this is like regurgitation.  Suspect mild to moderate.  I do not suspect this is severe.  He will go ahead and proceed with surgery and we will get an echocardiogram.  His surgery is next week and there is no need to delay this.  3. Tobacco abuse -Smoking cessation counseling provided   Disposition: Return in about 3 months (around 07/25/2020).  Medication Adjustments/Labs and Tests Ordered: Current medicines are reviewed at length with the patient today.  Concerns regarding medicines are outlined above.  Orders Placed This Encounter  Procedures  . EKG 12-Lead  . ECHOCARDIOGRAM COMPLETE   No orders of the defined types were placed in this encounter.   Patient Instructions  Medication Instructions:  The current medical regimen is effective;  continue present plan and medications.  *If you need a refill on your cardiac medications before your next appointment, please call your pharmacy*   Testing/Procedures: Echocardiogram - Your physician has requested that you have an echocardiogram. Echocardiography is a painless test that uses sound waves to create images of your heart. It provides your doctor with information about the size and shape of your heart and how well your heart's chambers and valves are working. This procedure takes approximately one hour. There are no restrictions for this procedure. This will be performed at our [facility] location - [address], [address].    Follow-Up: At [facility], you and your health  needs are our priority.  As part of our continuing mission to provide you with exceptional heart care, we have created designated Provider Care Teams.  These Care Teams include your primary  Cardiologist (physician) and Advanced Practice Providers (APPs -  Physician Assistants and Nurse Practitioners) who all work together to provide you with the care you need, when you need it.  We recommend signing up for the patient portal called "MyChart".  Sign up information is provided on this After Visit Summary.  MyChart is used to connect with patients for Virtual Visits (Telemedicine).  Patients are able to view lab/test results, encounter notes, upcoming appointments, etc.  Non-urgent messages can be sent to your provider as well.   To learn more about what you can do with MyChart, go to [email or web address].    Your next appointment:   3 month(s)  The format for your next appointment:   In Person  Provider:   Lennie Odor, MD         Signed, Lenna Gilford. Flora Lipps, MD [facility]  [address] [place], [place] [address] [phone]  04/24/2020 2:27 PM

## 2020-04-24 NOTE — Anesthesia Preprocedure Evaluation (Addendum)
Anesthesia Evaluation  Patient identified by MRN, date of birth, ID band Patient awake    Reviewed: Allergy & Precautions, NPO status , Patient's Chart, lab work & pertinent test results  History of Anesthesia Complications Negative for: history of anesthetic complications  Airway Mallampati: II  TM Distance: >3 FB Neck ROM: Full    Dental  (+) Dental Advisory Given   Pulmonary COPD,  COPD inhaler, Current Smoker and Patient abstained from smoking.,    Pulmonary exam normal        Cardiovascular hypertension, Pt. on medications Normal cardiovascular exam   Cardiac clearance, see PAT note for details    Neuro/Psych PSYCHIATRIC DISORDERS Depression CVA (left sided weakness), Residual Symptoms    GI/Hepatic GERD  Medicated and Controlled,(+)     substance abuse (denies cocaine since 2019, alcohol since 2004)  alcohol use and cocaine use,   Endo/Other  negative endocrine ROS  Renal/GU negative Renal ROS     Musculoskeletal  (+) Arthritis ,   Abdominal   Peds  Hematology  (+) anemia ,   Anesthesia Other Findings Covid test negative   Reproductive/Obstetrics                            Anesthesia Physical Anesthesia Plan  ASA: III  Anesthesia Plan: Spinal   Post-op Pain Management:    Induction:   PONV Risk Score and Plan: 1 and Treatment may vary due to age or medical condition and Propofol infusion  Airway Management Planned: Natural Airway and Simple Face Mask  Additional Equipment: None  Intra-op Plan:   Post-operative Plan:   Informed Consent: I have reviewed the patients History and Physical, chart, labs and discussed the procedure including the risks, benefits and alternatives for the proposed anesthesia with the patient or authorized representative who has indicated his/her understanding and acceptance.       Plan Discussed with: CRNA and  Anesthesiologist  Anesthesia Plan Comments: ( )      Anesthesia Quick Evaluation

## 2020-04-24 NOTE — Patient Instructions (Signed)
Medication Instructions:  The current medical regimen is effective;  continue present plan and medications.  *If you need a refill on your cardiac medications before your next appointment, please call your pharmacy*   Testing/Procedures: Echocardiogram - Your physician has requested that you have an echocardiogram. Echocardiography is a painless test that uses sound waves to create images of your heart. It provides your doctor with information about the size and shape of your heart and how well your heart's chambers and valves are working. This procedure takes approximately one hour. There are no restrictions for this procedure. This will be performed at our [address] location - [address], [address].    Follow-Up: At [facility], you and your health needs are our priority.  As part of our continuing mission to provide you with exceptional heart care, we have created designated Provider Care Teams.  These Care Teams include your primary Cardiologist (physician) and Advanced Practice Providers (APPs -  Physician Assistants and Nurse Practitioners) who all work together to provide you with the care you need, when you need it.  We recommend signing up for the patient portal called "MyChart".  Sign up information is provided on this After Visit Summary.  MyChart is used to connect with patients for Virtual Visits (Telemedicine).  Patients are able to view lab/test results, encounter notes, upcoming appointments, etc.  Non-urgent messages can be sent to your provider as well.   To learn more about what you can do with MyChart, go to [email or web address].    Your next appointment:   3 month(s)  The format for your next appointment:   In Person  Provider:   Emden O'Neal, MD      

## 2020-04-26 ENCOUNTER — Other Ambulatory Visit (HOSPITAL_COMMUNITY)
Admission: RE | Admit: 2020-04-26 | Discharge: 2020-04-26 | Disposition: A | Discharge status: Home / Self Care | Payer: HRSA Program | Source: Ambulatory Visit | Attending: Orthopaedic Surgery | Admitting: Orthopaedic Surgery

## 2020-04-26 DIAGNOSIS — Z01812 Encounter for preprocedural laboratory examination: Secondary | ICD-10-CM | POA: Insufficient documentation

## 2020-04-26 DIAGNOSIS — Z20822 Contact with and (suspected) exposure to covid-19: Secondary | ICD-10-CM | POA: Diagnosis not present

## 2020-04-26 LAB — SARS CORONAVIRUS 2 (TAT 6-24 HRS): SARS Coronavirus 2: NEGATIVE

## 2020-04-29 ENCOUNTER — Encounter (HOSPITAL_COMMUNITY)
Admission: RE | Disposition: A | Discharge status: Home / Self Care | Payer: Self-pay | Source: Ambulatory Visit | Attending: Orthopaedic Surgery

## 2020-04-29 ENCOUNTER — Ambulatory Visit (HOSPITAL_COMMUNITY): Payer: Self-pay

## 2020-04-29 ENCOUNTER — Ambulatory Visit (HOSPITAL_COMMUNITY): Payer: Self-pay | Admitting: Vascular Surgery

## 2020-04-29 ENCOUNTER — Observation Stay (HOSPITAL_COMMUNITY): Payer: Self-pay

## 2020-04-29 ENCOUNTER — Encounter (HOSPITAL_COMMUNITY): Payer: Self-pay | Admitting: Orthopaedic Surgery

## 2020-04-29 ENCOUNTER — Observation Stay (HOSPITAL_COMMUNITY)
Admission: RE | Admit: 2020-04-29 | Discharge: 2020-04-30 | Disposition: A | Discharge status: Home / Self Care | Payer: Self-pay | Source: Ambulatory Visit | Attending: Orthopaedic Surgery | Admitting: Orthopaedic Surgery

## 2020-04-29 ENCOUNTER — Other Ambulatory Visit: Payer: Self-pay

## 2020-04-29 DIAGNOSIS — M1612 Unilateral primary osteoarthritis, left hip: Principal | ICD-10-CM | POA: Insufficient documentation

## 2020-04-29 DIAGNOSIS — Z95810 Presence of automatic (implantable) cardiac defibrillator: Secondary | ICD-10-CM | POA: Insufficient documentation

## 2020-04-29 DIAGNOSIS — I110 Hypertensive heart disease with heart failure: Secondary | ICD-10-CM | POA: Insufficient documentation

## 2020-04-29 DIAGNOSIS — Z419 Encounter for procedure for purposes other than remedying health state, unspecified: Secondary | ICD-10-CM

## 2020-04-29 DIAGNOSIS — Z79899 Other long term (current) drug therapy: Secondary | ICD-10-CM | POA: Insufficient documentation

## 2020-04-29 DIAGNOSIS — Z87891 Personal history of nicotine dependence: Secondary | ICD-10-CM | POA: Insufficient documentation

## 2020-04-29 DIAGNOSIS — I5022 Chronic systolic (congestive) heart failure: Secondary | ICD-10-CM | POA: Insufficient documentation

## 2020-04-29 DIAGNOSIS — Z96642 Presence of left artificial hip joint: Secondary | ICD-10-CM

## 2020-04-29 DIAGNOSIS — I252 Old myocardial infarction: Secondary | ICD-10-CM | POA: Insufficient documentation

## 2020-04-29 DIAGNOSIS — Z7982 Long term (current) use of aspirin: Secondary | ICD-10-CM | POA: Insufficient documentation

## 2020-04-29 HISTORY — PX: TOTAL HIP ARTHROPLASTY: SHX124

## 2020-04-29 SURGERY — ARTHROPLASTY, HIP, TOTAL, ANTERIOR APPROACH
Anesthesia: Spinal | Site: Hip | Laterality: Left

## 2020-04-29 MED ORDER — PROPOFOL 10 MG/ML IV BOLUS
INTRAVENOUS | Status: AC
  Filled 2020-04-29: qty 20

## 2020-04-29 MED ORDER — ASPIRIN 81 MG PO CHEW
81.0000 mg | CHEWABLE_TABLET | Freq: Two times a day (BID) | ORAL | Status: DC
  Administered 2020-04-29 – 2020-04-30 (×2): 81 mg via ORAL
  Filled 2020-04-29 (×2): qty 1

## 2020-04-29 MED ORDER — POVIDONE-IODINE 10 % EX SWAB
2.0000 "application " | Freq: Once | CUTANEOUS | Status: AC
  Administered 2020-04-29: 2 via TOPICAL

## 2020-04-29 MED ORDER — OXYCODONE HCL 5 MG PO TABS
10.0000 mg | ORAL_TABLET | ORAL | Status: DC | PRN
  Administered 2020-04-29: 15 mg via ORAL
  Administered 2020-04-30 (×3): 10 mg via ORAL
  Filled 2020-04-29: qty 2
  Filled 2020-04-29: qty 3
  Filled 2020-04-29: qty 2

## 2020-04-29 MED ORDER — METHOCARBAMOL 500 MG PO TABS
500.0000 mg | ORAL_TABLET | Freq: Four times a day (QID) | ORAL | Status: DC | PRN
  Administered 2020-04-29 – 2020-04-30 (×2): 500 mg via ORAL
  Filled 2020-04-29 (×2): qty 1

## 2020-04-29 MED ORDER — OXYCODONE HCL 5 MG PO TABS
5.0000 mg | ORAL_TABLET | ORAL | Status: DC | PRN
  Administered 2020-04-29: 10 mg via ORAL
  Filled 2020-04-29 (×2): qty 2

## 2020-04-29 MED ORDER — OXYCODONE HCL 5 MG PO TABS: 5.0000 mg | ORAL_TABLET | Freq: Once | ORAL | Status: DC | PRN

## 2020-04-29 MED ORDER — ONDANSETRON HCL 4 MG/2ML IJ SOLN: 4.0000 mg | Freq: Four times a day (QID) | INTRAMUSCULAR | Status: DC | PRN

## 2020-04-29 MED ORDER — FENTANYL CITRATE (PF) 100 MCG/2ML IJ SOLN: 25.0000 ug | INTRAMUSCULAR | Status: DC | PRN

## 2020-04-29 MED ORDER — MIDAZOLAM HCL 2 MG/2ML IJ SOLN
INTRAMUSCULAR | Status: AC
  Filled 2020-04-29: qty 2

## 2020-04-29 MED ORDER — ONDANSETRON HCL 4 MG PO TABS: 4.0000 mg | ORAL_TABLET | Freq: Four times a day (QID) | ORAL | Status: DC | PRN

## 2020-04-29 MED ORDER — CLINDAMYCIN PHOSPHATE 900 MG/50ML IV SOLN
900.0000 mg | INTRAVENOUS | Status: AC
  Administered 2020-04-29: 900 mg via INTRAVENOUS
  Filled 2020-04-29: qty 50

## 2020-04-29 MED ORDER — GABAPENTIN 100 MG PO CAPS
100.0000 mg | ORAL_CAPSULE | Freq: Three times a day (TID) | ORAL | Status: DC
  Administered 2020-04-29 – 2020-04-30 (×3): 100 mg via ORAL
  Filled 2020-04-29 (×3): qty 1

## 2020-04-29 MED ORDER — METHOCARBAMOL 1000 MG/10ML IJ SOLN
500.0000 mg | Freq: Four times a day (QID) | INTRAVENOUS | Status: DC | PRN
  Filled 2020-04-29: qty 5

## 2020-04-29 MED ORDER — TAMSULOSIN HCL 0.4 MG PO CAPS
0.4000 mg | ORAL_CAPSULE | Freq: Every day | ORAL | Status: DC
  Administered 2020-04-29: 0.4 mg via ORAL
  Filled 2020-04-29: qty 1

## 2020-04-29 MED ORDER — MIDAZOLAM HCL 5 MG/5ML IJ SOLN
INTRAMUSCULAR | Status: DC | PRN
  Administered 2020-04-29: 2 mg via INTRAVENOUS

## 2020-04-29 MED ORDER — MENTHOL 3 MG MT LOZG: 1.0000 | LOZENGE | OROMUCOSAL | Status: DC | PRN

## 2020-04-29 MED ORDER — ONDANSETRON HCL 4 MG/2ML IJ SOLN
INTRAMUSCULAR | Status: AC
  Filled 2020-04-29: qty 2

## 2020-04-29 MED ORDER — ACETAMINOPHEN 325 MG PO TABS
325.0000 mg | ORAL_TABLET | Freq: Four times a day (QID) | ORAL | Status: DC | PRN
  Administered 2020-04-29 – 2020-04-30 (×2): 650 mg via ORAL
  Filled 2020-04-29 (×2): qty 2

## 2020-04-29 MED ORDER — DIPHENHYDRAMINE HCL 12.5 MG/5ML PO ELIX
12.5000 mg | ORAL_SOLUTION | ORAL | Status: DC | PRN
  Filled 2020-04-29: qty 10

## 2020-04-29 MED ORDER — PHENYLEPHRINE HCL-NACL 10-0.9 MG/250ML-% IV SOLN
INTRAVENOUS | Status: DC | PRN
  Administered 2020-04-29: 35 ug/min via INTRAVENOUS

## 2020-04-29 MED ORDER — ATORVASTATIN CALCIUM 40 MG PO TABS
40.0000 mg | ORAL_TABLET | Freq: Every day | ORAL | Status: DC
  Administered 2020-04-29: 40 mg via ORAL
  Filled 2020-04-29: qty 1

## 2020-04-29 MED ORDER — CLINDAMYCIN PHOSPHATE 600 MG/50ML IV SOLN
600.0000 mg | Freq: Four times a day (QID) | INTRAVENOUS | Status: AC
  Administered 2020-04-29 – 2020-04-30 (×2): 600 mg via INTRAVENOUS
  Filled 2020-04-29 (×2): qty 50

## 2020-04-29 MED ORDER — CITALOPRAM HYDROBROMIDE 20 MG PO TABS
20.0000 mg | ORAL_TABLET | Freq: Every day | ORAL | Status: DC
  Administered 2020-04-29 – 2020-04-30 (×2): 20 mg via ORAL
  Filled 2020-04-29 (×2): qty 1

## 2020-04-29 MED ORDER — METOCLOPRAMIDE HCL 5 MG/ML IJ SOLN: 5.0000 mg | Freq: Three times a day (TID) | INTRAMUSCULAR | Status: DC | PRN

## 2020-04-29 MED ORDER — LACTATED RINGERS IV SOLN: INTRAVENOUS | Status: DC

## 2020-04-29 MED ORDER — TRANEXAMIC ACID-NACL 1000-0.7 MG/100ML-% IV SOLN
1000.0000 mg | INTRAVENOUS | Status: AC
  Administered 2020-04-29: 1000 mg via INTRAVENOUS
  Filled 2020-04-29: qty 100

## 2020-04-29 MED ORDER — POLYETHYLENE GLYCOL 3350 17 G PO PACK: 17.0000 g | PACK | Freq: Every day | ORAL | Status: DC | PRN

## 2020-04-29 MED ORDER — DOCUSATE SODIUM 100 MG PO CAPS
100.0000 mg | ORAL_CAPSULE | Freq: Two times a day (BID) | ORAL | Status: DC
  Administered 2020-04-29 – 2020-04-30 (×2): 100 mg via ORAL
  Filled 2020-04-29 (×2): qty 1

## 2020-04-29 MED ORDER — OXYCODONE HCL 5 MG/5ML PO SOLN: 5.0000 mg | Freq: Once | ORAL | Status: DC | PRN

## 2020-04-29 MED ORDER — AMLODIPINE BESYLATE 5 MG PO TABS
5.0000 mg | ORAL_TABLET | Freq: Every day | ORAL | Status: DC
  Administered 2020-04-29 – 2020-04-30 (×2): 5 mg via ORAL
  Filled 2020-04-29 (×2): qty 1

## 2020-04-29 MED ORDER — BUPIVACAINE IN DEXTROSE 0.75-8.25 % IT SOLN
INTRATHECAL | Status: DC | PRN
  Administered 2020-04-29: 1.6 mL via INTRATHECAL

## 2020-04-29 MED ORDER — SODIUM CHLORIDE 0.9 % IV SOLN: INTRAVENOUS | Status: DC

## 2020-04-29 MED ORDER — CHLORHEXIDINE GLUCONATE 0.12 % MT SOLN
15.0000 mL | Freq: Once | OROMUCOSAL | Status: AC
  Administered 2020-04-29: 15 mL via OROMUCOSAL
  Filled 2020-04-29: qty 15

## 2020-04-29 MED ORDER — FENTANYL CITRATE (PF) 250 MCG/5ML IJ SOLN
INTRAMUSCULAR | Status: AC
  Filled 2020-04-29: qty 5

## 2020-04-29 MED ORDER — LISINOPRIL 20 MG PO TABS
20.0000 mg | ORAL_TABLET | Freq: Every day | ORAL | Status: DC
  Administered 2020-04-29: 20 mg via ORAL
  Filled 2020-04-29: qty 1

## 2020-04-29 MED ORDER — PROPOFOL 500 MG/50ML IV EMUL: INTRAVENOUS | Status: DC | PRN

## 2020-04-29 MED ORDER — PROMETHAZINE HCL 25 MG/ML IJ SOLN: 6.2500 mg | INTRAMUSCULAR | Status: DC | PRN

## 2020-04-29 MED ORDER — PHENOL 1.4 % MT LIQD: 1.0000 | OROMUCOSAL | Status: DC | PRN

## 2020-04-29 MED ORDER — PROPOFOL 500 MG/50ML IV EMUL
INTRAVENOUS | Status: DC | PRN
  Administered 2020-04-29: 75 ug/kg/min via INTRAVENOUS

## 2020-04-29 MED ORDER — HYDROMORPHONE HCL 1 MG/ML IJ SOLN
0.5000 mg | INTRAMUSCULAR | Status: DC | PRN
  Administered 2020-04-29 (×2): 1 mg via INTRAVENOUS
  Filled 2020-04-29 (×2): qty 1

## 2020-04-29 MED ORDER — ALBUTEROL SULFATE (2.5 MG/3ML) 0.083% IN NEBU: 2.5000 mg | INHALATION_SOLUTION | Freq: Four times a day (QID) | RESPIRATORY_TRACT | Status: DC | PRN

## 2020-04-29 MED ORDER — METOCLOPRAMIDE HCL 5 MG PO TABS
5.0000 mg | ORAL_TABLET | Freq: Three times a day (TID) | ORAL | Status: DC | PRN
  Administered 2020-04-29: 10 mg via ORAL
  Filled 2020-04-29: qty 2

## 2020-04-29 MED ORDER — PANTOPRAZOLE SODIUM 40 MG PO TBEC
40.0000 mg | DELAYED_RELEASE_TABLET | Freq: Two times a day (BID) | ORAL | Status: DC
  Administered 2020-04-29 – 2020-04-30 (×2): 40 mg via ORAL
  Filled 2020-04-29 (×2): qty 1

## 2020-04-29 MED ORDER — ORAL CARE MOUTH RINSE: 15.0000 mL | Freq: Once | OROMUCOSAL | Status: AC

## 2020-04-29 MED ORDER — FENTANYL CITRATE (PF) 250 MCG/5ML IJ SOLN
INTRAMUSCULAR | Status: DC | PRN
  Administered 2020-04-29: 50 ug via INTRAVENOUS

## 2020-04-29 MED ORDER — ALUM & MAG HYDROXIDE-SIMETH 200-200-20 MG/5ML PO SUSP: 30.0000 mL | ORAL | Status: DC | PRN

## 2020-04-29 SURGICAL SUPPLY — 60 items
ACETAB CUP W/GRIPTION 54 (Plate) ×2 IMPLANT
APL SKNCLS STERI-STRIP NONHPOA (GAUZE/BANDAGES/DRESSINGS) ×1
ARTICULEZE HEAD (Hips) ×2 IMPLANT
BENZOIN TINCTURE PRP APPL 2/3 (GAUZE/BANDAGES/DRESSINGS) ×2 IMPLANT
BLADE CLIPPER SURG (BLADE) IMPLANT
BLADE SAW SGTL 18X1.27X75 (BLADE) ×2 IMPLANT
COLLAR OFFSET CORAIL SZ 12 HIP (Stem) IMPLANT
CORAIL OFFSET COLLAR SZ 12 HIP (Stem) ×2 IMPLANT
COVER SURGICAL LIGHT HANDLE (MISCELLANEOUS) ×2 IMPLANT
COVER WAND RF STERILE (DRAPES) ×2 IMPLANT
CUP ACETAB W/GRIPTION 54 (Plate) IMPLANT
DRAPE C-ARM 42X72 X-RAY (DRAPES) ×2 IMPLANT
DRAPE STERI IOBAN 125X83 (DRAPES) ×2 IMPLANT
DRAPE U-SHAPE 47X51 STRL (DRAPES) ×6 IMPLANT
DRESSING AQUACEL AG SP 3.5X10 (GAUZE/BANDAGES/DRESSINGS) IMPLANT
DRSG AQUACEL AG ADV 3.5X10 (GAUZE/BANDAGES/DRESSINGS) ×2 IMPLANT
DRSG AQUACEL AG SP 3.5X10 (GAUZE/BANDAGES/DRESSINGS) ×2
DRSG XEROFORM 1X8 (GAUZE/BANDAGES/DRESSINGS) ×1 IMPLANT
DURAPREP 26ML APPLICATOR (WOUND CARE) ×2 IMPLANT
ELECT BLADE 4.0 EZ CLEAN MEGAD (MISCELLANEOUS) ×2
ELECT BLADE 6.5 EXT (BLADE) IMPLANT
ELECT REM PT RETURN 9FT ADLT (ELECTROSURGICAL) ×2
ELECTRODE BLDE 4.0 EZ CLN MEGD (MISCELLANEOUS) ×1 IMPLANT
ELECTRODE REM PT RTRN 9FT ADLT (ELECTROSURGICAL) ×1 IMPLANT
FACESHIELD WRAPAROUND (MASK) ×4 IMPLANT
FACESHIELD WRAPAROUND OR TEAM (MASK) ×2 IMPLANT
GLOVE BIOGEL PI IND STRL 8 (GLOVE) ×2 IMPLANT
GLOVE BIOGEL PI INDICATOR 8 (GLOVE) ×2
GLOVE ECLIPSE 8.0 STRL XLNG CF (GLOVE) ×2 IMPLANT
GLOVE ORTHO TXT STRL SZ7.5 (GLOVE) ×4 IMPLANT
GOWN STRL REUS W/ TWL LRG LVL3 (GOWN DISPOSABLE) ×2 IMPLANT
GOWN STRL REUS W/ TWL XL LVL3 (GOWN DISPOSABLE) ×2 IMPLANT
GOWN STRL REUS W/TWL LRG LVL3 (GOWN DISPOSABLE) ×4
GOWN STRL REUS W/TWL XL LVL3 (GOWN DISPOSABLE) ×4
HANDPIECE INTERPULSE COAX TIP (DISPOSABLE) ×2
HEAD ARTICULEZE (Hips) IMPLANT
KIT BASIN OR (CUSTOM PROCEDURE TRAY) ×2 IMPLANT
KIT TURNOVER KIT B (KITS) ×2 IMPLANT
LINER NEUTRAL 54X36MM PLUS 4 (Hips) ×1 IMPLANT
MANIFOLD NEPTUNE II (INSTRUMENTS) ×2 IMPLANT
NS IRRIG 1000ML POUR BTL (IV SOLUTION) ×2 IMPLANT
PACK TOTAL JOINT (CUSTOM PROCEDURE TRAY) ×2 IMPLANT
PAD ARMBOARD 7.5X6 YLW CONV (MISCELLANEOUS) ×2 IMPLANT
SET HNDPC FAN SPRY TIP SCT (DISPOSABLE) ×1 IMPLANT
STAPLER VISISTAT 35W (STAPLE) IMPLANT
STRIP CLOSURE SKIN 1/2X4 (GAUZE/BANDAGES/DRESSINGS) ×4 IMPLANT
SUT ETHIBOND NAB CT1 #1 30IN (SUTURE) ×2 IMPLANT
SUT MNCRL AB 4-0 PS2 18 (SUTURE) IMPLANT
SUT VIC AB 0 CT1 27 (SUTURE) ×2
SUT VIC AB 0 CT1 27XBRD ANBCTR (SUTURE) ×1 IMPLANT
SUT VIC AB 1 CT1 27 (SUTURE) ×2
SUT VIC AB 1 CT1 27XBRD ANBCTR (SUTURE) ×1 IMPLANT
SUT VIC AB 2-0 CT1 27 (SUTURE) ×2
SUT VIC AB 2-0 CT1 TAPERPNT 27 (SUTURE) ×1 IMPLANT
TOWEL GREEN STERILE (TOWEL DISPOSABLE) ×2 IMPLANT
TOWEL GREEN STERILE FF (TOWEL DISPOSABLE) ×2 IMPLANT
TRAY CATH 16FR W/PLASTIC CATH (SET/KITS/TRAYS/PACK) IMPLANT
TRAY FOLEY W/BAG SLVR 16FR (SET/KITS/TRAYS/PACK) ×2
TRAY FOLEY W/BAG SLVR 16FR ST (SET/KITS/TRAYS/PACK) IMPLANT
WATER STERILE IRR 1000ML POUR (IV SOLUTION) ×4 IMPLANT

## 2020-04-29 NOTE — Op Note (Signed)
NAMECIRO, TASHIRO MEDICAL RECORD ON:[id number] ACCOUNT [id number] DATE OF BIRTH:Sep 27, 1961 FACILITY: [facility] LOCATION: [facility] PHYSICIAN:Antonela Freiman Aretha Parrot, MD  OPERATIVE REPORT  DATE OF PROCEDURE:  04/29/2020  PREOPERATIVE DIAGNOSIS:  Primary osteoarthritis and degenerative joint disease, left hip.  POSTOPERATIVE DIAGNOSIS:  Primary osteoarthritis and degenerative joint disease, left hip.  PROCEDURE:  Left total hip arthroplasty through direct anterior approach.  IMPLANTS:  DePuy Sector Gription acetabular component size 54, size 36+4 neutral polyethylene liner, size 12 Corail femoral component with high offset, size 36+5 metal hip ball.  SURGEON:  Vanita Panda.  Magnus Ivan, MD  ASSISTANT:  Richardean Canal, PA-C  ANESTHESIA:  Spinal.  ANTIBIOTICS:  900 mg IV clindamycin.  ESTIMATED BLOOD LOSS:  100 mL.  COMPLICATIONS:  None.  INDICATIONS:  The patient is a very pleasant 59 year old gentleman with debilitating arthritis involving his left hip.  He has tried and failed all forms of conservative treatment.  His x-rays show bone-on-bone arthritis of the left hip.  His pain is  daily and it is detrimentally affecting his activities of daily living, his quality of life and mobility to the point he does wish to proceed with a total hip arthroplasty and we have recommended that as well.  With that type of surgery, he understands  there is at risk of acute blood loss anemia, nerve or vessel injury, fracture, infection, dislocation, DVT and implant failure.  He understands the goals are to decrease pain, improve mobility and overall improve quality of life.  DESCRIPTION OF PROCEDURE:  After informed consent was obtained, the appropriate left hip was marked.  He was brought to the operating room and sat up on a stretcher where spinal anesthesia was obtained.  He was laid in the supine position on a stretcher.   Foley catheter was placed and traction boots were placed on both his feet.   Next, he was placed supine on the Hana fracture table with the perineal post in place and both legs in line skeletal traction device and no traction applied.  His left  operative hip was prepped and draped with DuraPrep and sterile drapes.  A time-out was called.  He was identified, correct patient, correct left hip.  I then made an incision just inferior and posterior to the anterior superior iliac spine and carried  this obliquely down the leg.  We dissected down to the tensor fascia lata muscle.  Tensor fascia was then divided longitudinally to proceed with direct anterior approach to the hip.  We identified and cauterized circumflex vessels and identified the hip  capsule, opened up the hip capsule in an L-type format, finding a moderate joint effusion and significant periarticular osteophytes around the femoral head and neck.  Within the joint capsule, we placed Cobra retractors and then made our femoral neck cut  with an oscillating saw just proximal to the lesser trochanter and completed this with an osteotome.  We placed a corkscrew guide in the femoral head and removed the femoral head in its entirety and found a wide area devoid of cartilage.  I then placed  a bent Hohmann over the medial acetabular rim and removed remnants of acetabular labrum and other debris.  We then began reaming under direct visualization from a size 44 reamer in stepwise increments up to a size 53.  With all reamers under direct  visualization, the last reamer was also placed under direct fluoroscopy, so we could obtain our depth of reaming, our inclination and anteversion.  I then placed the real DePuy  Sector Gription acetabular component size 54 and a 36+4 neutral polyethylene  liner for that size acetabular component.  Attention was then turned to the femur.  With the leg externally rotated to 120 degrees, extended and adducted, we are to place a Mueller retractor medially and a Hohmann retractor behind the greater  trochanter.   We released the lateral joint capsule and used a box-cutting osteotome to enter the femoral canal and a rongeur to lateralize.  We then began broaching using the Corail broaching system from a size 8 up to a size 12.  The size 12 in place, we tried a  high offset femoral neck based off his radiographic anatomy and offset and with a 36+1.5 hip ball, reduced this in the acetabulum.  We felt like he needed just a little bit more leg length.  Otherwise, the hip was stable.  We then dislocated the hip and  removed the trial components.  We placed the real high offset femoral component, size, which was a Corail, followed by a 36+5 metal hip ball and again reduced this in the acetabulum.  We were pleased with range of motion, leg length, offset and  stability.  We then irrigated the soft tissue with normal saline solution using pulsatile lavage.  We closed the joint capsule with interrupted #1 Ethibond suture, followed by closing the tensor fascia with #1 Vicryl, 0 Vicryl was used to close deep  tissue, 2-0 Vicryl was used in the subcutaneous tissue.  The skin was reapproximated with staples.  Xeroform and Aquacel dressing was applied.  He was taken off the Hana table and taken to recovery room in stable condition.  All final counts were  correct.  There were no complications noted.  Of note, Rexene Edison, PA-C, assisted during the entire case.  His assistance was crucial for facilitating all aspects of this case.  VN/NUANCE  D:04/29/2020 T:04/29/2020 JOB:[id number]

## 2020-04-29 NOTE — Transfer of Care (Signed)
Immediate Anesthesia Transfer of Care Note  Patient: [name]  Procedure(s) Performed: LEFT TOTAL HIP ARTHROPLASTY ANTERIOR APPROACH (Left Hip)  Patient Location: PACU  Anesthesia Type:MAC combined with regional for post-op pain  Level of Consciousness: awake, alert  and oriented  Airway & Oxygen Therapy: Patient Spontanous Breathing  Post-op Assessment: Report given to RN and Post -op Vital signs reviewed and stable  Post vital signs: Reviewed and stable  Last Vitals:  Vitals Value Taken Time  BP 87/59 04/29/20 1543  Temp    Pulse 72 04/29/20 1545  Resp 18 04/29/20 1545  SpO2 100 % 04/29/20 1545  Vitals shown include unvalidated device data.  Last Pain:  Vitals:   04/29/20 1219  TempSrc:   PainSc: 2       Patients Stated Pain Goal: 4 (37/10/62 6948)  Complications: No complications documented.

## 2020-04-29 NOTE — Evaluation (Signed)
Physical Therapy Evaluation Patient Details Name: [name] MRN: [id number] DOB: 12-20-1960 Today's Date: 04/29/2020   History of Present Illness  Pt is 59 yo male with PMH of L hip OA, COPD, CKD, hyperlipidemia, tobacco use disorder, R pontine CVA, and HTN (see H and P for full hx).  Pt admitted for L anterior THA on 04/29/20.  Clinical Impression  Pt is s/p L anterior THA resulting in the deficits listed below (see PT Problem List). Pt was able to transfer and ambulate 150' with RW  And min A.  Pt did still have decreased light touch in plantar feet and experienced one episode of buckling with turns while walking.  This improved with cues to get  Closer to RW.  Pt with support at home and ramp to enter home.  Pt expected to progress well with therapy. Pt will benefit from skilled PT to increase their independence and safety with mobility to allow discharge to the venue listed below.      Follow Up Recommendations Follow surgeon's recommendation for DC plan and follow-up therapies;Supervision/Assistance - 24 hour    Equipment Recommendations  Rolling walker with 5" wheels    Recommendations for Other Services       Precautions / Restrictions Precautions Precautions: Fall Restrictions Weight Bearing Restrictions: Yes LLE Weight Bearing: Weight bearing as tolerated      Mobility  Bed Mobility Overal bed mobility: Needs Assistance Bed Mobility: Supine to Sit;Sit to Supine     Supine to sit: Min assist Sit to supine: Min assist   General bed mobility comments: min A for L LE into and out of bed; use of Trapeze for bed mobility  Transfers Overall transfer level: Needs assistance Equipment used: Rolling walker (2 wheeled) Transfers: Sit to/from Stand Sit to Stand: Min guard         General transfer comment: cues for safe hand placment  Ambulation/Gait Ambulation/Gait assistance: Min assist Gait Distance (Feet): 150 Feet Assistive device: Rolling walker (2 wheeled) Gait  Pattern/deviations: Decreased stride length;Step-through pattern;Trunk flexed Gait velocity: decreased   General Gait Details: Cued for posture and RW use/proxmity.  Pt had one episode of buckling with turning - reports "ankle gave way from being numb."  Require min A to recover.  Noted that pt had gotten slightly outside RW , cued to stay inside walker.  Stairs            Wheelchair Mobility    Modified Rankin (Stroke Patients Only)       Balance Overall balance assessment: Needs assistance Sitting-balance support: No upper extremity supported;Feet supported Sitting balance-Leahy Scale: Good     Standing balance support: Bilateral upper extremity supported Standing balance-Leahy Scale: Poor Standing balance comment: reliant on RW                             Pertinent Vitals/Pain Pain Assessment: 0-10 Pain Score: 7  Pain Location: L hip Pain Descriptors / Indicators: Sore;Discomfort Pain Intervention(s): Limited activity within patient's tolerance;Monitored during session;Repositioned;Ice applied;Patient requesting pain meds-RN notified    Home Living Family/patient expects to be discharged to:: Private residence Living Arrangements: Spouse/significant other Available Help at Discharge: Family;Available 24 hours/day Type of Home: Mobile home Home Access: Ramped entrance     Home Layout: One level Home Equipment: Cane - single point;Shower seat;Grab bars - tub/shower Additional Comments: toilet is next to sink ( can push up on sink)    Prior Function Level of  Independence: Independent         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        Extremity/Trunk Assessment   Upper Extremity Assessment Upper Extremity Assessment: Overall WFL for tasks assessed    Lower Extremity Assessment Lower Extremity Assessment: LLE deficits/detail;RLE deficits/detail RLE Deficits / Details: ROM WFL; MMT 5/5 RLE Sensation: decreased light touch  (plantar feet (from spinal)) LLE Deficits / Details: ROM WFL; MMT ankle 5/5, knee gentle 5/5 , hip 3/5 not further tested LLE Sensation: decreased light touch (plantar foot (from spinal))    Cervical / Trunk Assessment Cervical / Trunk Assessment: Normal  Communication   Communication: No difficulties  Cognition Arousal/Alertness: Awake/alert Behavior During Therapy: WFL for tasks assessed/performed Overall Cognitive Status: Within Functional Limits for tasks assessed                                        General Comments General comments (skin integrity, edema, etc.): Pt educated on ankle pumps and quad sets; no further exercise today due to pain    Exercises     Assessment/Plan    PT Assessment Patient needs continued PT services  PT Problem List Decreased strength;Decreased mobility;Decreased safety awareness;Decreased coordination;Decreased knowledge of precautions;Decreased activity tolerance;Decreased balance;Decreased knowledge of use of DME;Pain       PT Treatment Interventions DME instruction;Therapeutic activities;Modalities;Gait training;Therapeutic exercise;Patient/family education;Balance training;Stair training;Functional mobility training    PT Goals (Current goals can be found in the Care Plan section)  Acute Rehab PT Goals Patient Stated Goal: return home PT Goal Formulation: With patient/family Time For Goal Achievement: 05/13/20 Potential to Achieve Goals: Good    Frequency 7X/week   Barriers to discharge        Co-evaluation               AM-PAC PT "6 Clicks" Mobility  Outcome Measure Help needed turning from your back to your side while in a flat bed without using bedrails?: A Little Help needed moving from lying on your back to sitting on the side of a flat bed without using bedrails?: A Little Help needed moving to and from a bed to a chair (including a wheelchair)?: A Little Help needed standing up from a chair using  your arms (e.g., wheelchair or bedside chair)?: A Little Help needed to walk in hospital room?: A Little Help needed climbing 3-5 steps with a railing? : A Lot 6 Click Score: 17    End of Session Equipment Utilized During Treatment: Gait belt Activity Tolerance: Patient tolerated treatment well Patient left: in bed;with call bell/phone within reach;with bed alarm set;with family/visitor present Nurse Communication: Mobility status;Patient requests pain meds PT Visit Diagnosis: Unsteadiness on feet (R26.81);Muscle weakness (generalized) (M62.81)    Time: 1720-1749 PT Time Calculation (min) (ACUTE ONLY): 29 min   Charges:   PT Evaluation $PT Eval Moderate Complexity: 1 Andi Hence, PT Acute Rehab Services Pager [phone] [facility] [phone]    Rayetta Humphrey 04/29/2020, 6:04 PM

## 2020-04-29 NOTE — Progress Notes (Signed)
Orthopedic Tech Progress Note Patient Details:  [name] June 12, 1961 [id number] Applied Over Head Frame with Trapeze for patient with therapy at bedside Patient ID: CLABORN JANUSZ, male   DOB: [birth year], 59 y.o.   MRN: [id number]   Donald Pore 04/29/2020, 5:29 PM

## 2020-04-29 NOTE — Brief Op Note (Signed)
04/29/2020  3:32 PM  PATIENT:  [name]  59 y.o. male  PRE-OPERATIVE DIAGNOSIS:  osteoarthritis left hip  POST-OPERATIVE DIAGNOSIS:  osteoarthritis left hip  PROCEDURE:  Procedure(s): LEFT TOTAL HIP ARTHROPLASTY ANTERIOR APPROACH (Left)  SURGEON:  Surgeon(s) and Role:    Kathryne Hitch, MD - Primary  PHYSICIAN ASSISTANT:  Rexene Edison, PA-C  ANESTHESIA:   spinal  EBL:  100 mL   COUNTS:  YES  DICTATION: .Other Dictation: Dictation Number [id number]  PLAN OF CARE: Admit for overnight observation  PATIENT DISPOSITION:  PACU - hemodynamically stable.   Delay start of Pharmacological VTE agent (>24hrs) due to surgical blood loss or risk of bleeding: no

## 2020-04-29 NOTE — Anesthesia Procedure Notes (Signed)
Spinal  Patient location during procedure: OR Start time: 04/29/2020 2:07 PM End time: 04/29/2020 2:11 PM Staffing Performed: anesthesiologist  Anesthesiologist: Beryle Lathe, MD Preanesthetic Checklist Completed: patient identified, IV checked, risks and benefits discussed, surgical consent, monitors and equipment checked, pre-op evaluation and timeout performed Spinal Block Patient position: sitting Prep: DuraPrep Patient monitoring: heart rate, cardiac monitor, continuous pulse ox and blood pressure Approach: midline Location: L3-4 Injection technique: single-shot Needle Needle type: Pencan  Needle gauge: 24 G Additional Notes Consent was obtained prior to the procedure with all questions answered and concerns addressed. Risks including, but not limited to, bleeding, infection, nerve damage, paralysis, failed block, inadequate analgesia, allergic reaction, high spinal, itching, and headache were discussed and the patient wished to proceed. Functioning IV was confirmed and monitors were applied. Sterile prep and drape, including hand hygiene, mask, and sterile gloves were used. The patient was positioned and the spine was prepped. The skin was anesthetized with lidocaine. Free flow of clear CSF was obtained prior to injecting local anesthetic into the CSF. The spinal needle aspirated freely following injection. The needle was carefully withdrawn. The patient tolerated the procedure well.   Leslye Peer, MD

## 2020-04-29 NOTE — H&P (Signed)
TOTAL HIP ADMISSION H&P  Patient is admitted for left total hip arthroplasty.  Subjective:  Chief Complaint: left hip pain  HPI: [name], 59 y.o. male, has a history of pain and functional disability in the left hip(s) due to arthritis and patient has failed non-surgical conservative treatments for greater than 12 weeks to include NSAID's and/or analgesics, corticosteriod injections, flexibility and strengthening excercises and activity modification.  Onset of symptoms was gradual starting 3 years ago with gradually worsening course since that time.The patient noted no past surgery on the left hip(s).  Patient currently rates pain in the left hip at 10 out of 10 with activity. Patient has night pain, worsening of pain with activity and weight bearing, trendelenberg gait, pain that interfers with activities of daily living and pain with passive range of motion. Patient has evidence of subchondral cysts, subchondral sclerosis, periarticular osteophytes and joint space narrowing by imaging studies. This condition presents safety issues increasing the risk of falls.  There is no current active infection.  Patient Active Problem List   Diagnosis Date Noted  . Unilateral primary osteoarthritis, left hip 03/05/2020  . COPD (chronic obstructive pulmonary disease) (HCC) 04/11/2019  . 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 Labile blood pressure   . CKD (chronic kidney disease), stage II   . Benign prostatic hyperplasia   . Hyperlipidemia 08/14/2018  . Tobacco use disorder 08/14/2018  . Obesity 08/14/2018  . GERD (gastroesophageal reflux disease) 08/14/2018  . Leg cramp, left 08/14/2018  . Diastolic dysfunction   . Polysubstance abuse (HCC)   . Right pontine cerebrovascular accident [facility]) s/p tPA 08/11/2018  . Hypertension 02/17/2014   Past Medical History:  Diagnosis Date  . Acid reflux   . Arthritis   . COPD (chronic obstructive pulmonary disease) (HCC)   . Depression   . Hypertension    . Kidney stone   . Stroke [facility])     Past Surgical History:  Procedure Laterality Date  . CHOLECYSTECTOMY      No current facility-administered medications for this encounter.   Allergies  Allergen Reactions  . Morphine And Related     Altered mental status  . Penicillins Hives    Has patient had a PCN reaction causing immediate rash, facial/tongue/throat swelling, SOB or lightheadedness with hypotension: Yes Has patient had a PCN reaction causing severe rash involving mucus membranes or skin necrosis: No Has patient had a PCN reaction that required hospitalization: No Has patient had a PCN reaction occurring within the last 10 years: No If all of the above answers are "NO", then may proceed with Cephalosporin use.     Social History   Tobacco Use  . Smoking status: Current Every Day Smoker    Packs/day: 1.00    Years: 40.00    Pack years: 40.00    Types: Cigarettes  . Smokeless tobacco: Never Used  . Tobacco comment: pack a day was two packs before stroke  Substance Use Topics  . Alcohol use: Not Currently    Alcohol/week: 0.0 standard drinks    Comment: none since 2004. previously heavy drinker    Family History  Problem Relation Age of Onset  . Epilepsy Mother   . Hypertension Father   . Heart disease Father   . Hyperlipidemia Father   . Diabetes Father      Review of Systems  Objective:  Physical Exam Vitals reviewed.  Constitutional:      Appearance: Normal appearance.  HENT:     Head: Normocephalic and  atraumatic.  Eyes:     Pupils: Pupils are equal, round, and reactive to light.  Cardiovascular:     Rate and Rhythm: Normal rate.  Pulmonary:     Effort: Pulmonary effort is normal.  Abdominal:     Palpations: Abdomen is soft.  Musculoskeletal:     Cervical back: Normal range of motion.     Left hip: Tenderness and bony tenderness present. Decreased range of motion. Decreased strength.  Neurological:     Mental Status: He is alert and oriented  to person, place, and time.  Psychiatric:        Behavior: Behavior normal.     Vital signs in last 24 hours: Temp:  [97.6 F (36.4 C)] 97.6 F (36.4 C) (07/06 1118) Pulse Rate:  [80] 80 (07/06 1118) Resp:  [20] 20 (07/06 1118) BP: (148)/(78) 148/78 (07/06 1118) SpO2:  [97 %] 97 % (07/06 1118) Weight:  [84.6 kg] 84.6 kg (07/06 1118)  Labs:   Estimated body mass index is 28.37 kg/m as calculated from the following:   Height as of this encounter: 5\' 8"  (1.727 m).   Weight as of this encounter: 84.6 kg.   Imaging Review Plain radiographs demonstrate severe degenerative joint disease of the left hip(s). The bone quality appears to be excellent for age and reported activity level.      Assessment/Plan:  End stage arthritis, left hip(s)  The patient history, physical examination, clinical judgement of the provider and imaging studies are consistent with end stage degenerative joint disease of the left hip(s) and total hip arthroplasty is deemed medically necessary. The treatment options including medical management, injection therapy, arthroscopy and arthroplasty were discussed at length. The risks and benefits of total hip arthroplasty were presented and reviewed. The risks due to aseptic loosening, infection, stiffness, dislocation/subluxation,  thromboembolic complications and other imponderables were discussed.  The patient acknowledged the explanation, agreed to proceed with the plan and consent was signed. Patient is being admitted for inpatient treatment for surgery, pain control, PT, OT, prophylactic antibiotics, VTE prophylaxis, progressive ambulation and ADL's and discharge planning.The patient is planning to be discharged home with home health services

## 2020-04-29 NOTE — Discharge Instructions (Signed)

## 2020-04-30 ENCOUNTER — Telehealth: Payer: Self-pay

## 2020-04-30 LAB — CBC
HCT: 28.7 % — ABNORMAL LOW (ref 39.0–52.0)
Hemoglobin: 9.3 g/dL — ABNORMAL LOW (ref 13.0–17.0)
MCH: 29.9 pg (ref 26.0–34.0)
MCHC: 32.4 g/dL (ref 30.0–36.0)
MCV: 92.3 fL (ref 80.0–100.0)
Platelets: 308 10*3/uL (ref 150–400)
RBC: 3.11 MIL/uL — ABNORMAL LOW (ref 4.22–5.81)
RDW: 13.8 % (ref 11.5–15.5)
WBC: 8 10*3/uL (ref 4.0–10.5)
nRBC: 0 % (ref 0.0–0.2)

## 2020-04-30 LAB — BASIC METABOLIC PANEL
Anion gap: 9 (ref 5–15)
BUN: 19 mg/dL (ref 6–20)
CO2: 27 mmol/L (ref 22–32)
Calcium: 8.1 mg/dL — ABNORMAL LOW (ref 8.9–10.3)
Chloride: 100 mmol/L (ref 98–111)
Creatinine, Ser: 1.14 mg/dL (ref 0.61–1.24)
GFR calc Af Amer: >60 mL/min (ref >60)
GFR calc non Af Amer: >60 mL/min (ref >60)
Glucose, Bld: 132 mg/dL — ABNORMAL HIGH (ref 70–99)
Potassium: 3.2 mmol/L — ABNORMAL LOW (ref 3.5–5.1)
Sodium: 136 mmol/L (ref 135–145)

## 2020-04-30 MED ORDER — ASPIRIN 81 MG PO CHEW: 81.0000 mg | CHEWABLE_TABLET | Freq: Two times a day (BID) | ORAL | 0 refills | Status: AC

## 2020-04-30 MED ORDER — OXYCODONE HCL 5 MG PO TABS: 5.0000 mg | ORAL_TABLET | ORAL | 0 refills | Status: DC | PRN

## 2020-04-30 MED ORDER — METHOCARBAMOL 500 MG PO TABS: 500.0000 mg | ORAL_TABLET | Freq: Four times a day (QID) | ORAL | 1 refills | Status: DC | PRN

## 2020-04-30 NOTE — TOC Transition Note (Signed)
Transition of Care [facility]) - CM/SW Discharge Note   Patient Details  Name: [name] MRN: [id number] Date of Birth: [birth year]  Transition of Care [facility]) CM/SW Contact:  Mearl Latin, LCSW Phone Number: 04/30/2020, 1:26 PM   Clinical Narrative:    [facility] unable to accept patient for charity home health and they were unable to find another agency either. CSW sent referral to [facility] outpatient rehab in [place]. No other needs identified at this time.    Final next level of care: OP Rehab Barriers to Discharge: Barriers Resolved   Patient Goals and CMS Choice Patient states their goals for this hospitalization and ongoing recovery are:: Rehab CMS Medicare.gov Compare Post Acute Care list provided to:: Patient Choice offered to / list presented to : Patient  Discharge Placement                    Patient and family notified of of transfer: 04/30/20  Discharge Plan and Services In-house Referral: Clinical Social Work Discharge Planning Services: CM Consult Post Acute Care Choice: Durable Medical Equipment, Home Health          DME Arranged: Walker rolling DME Agency: [employer] Date DME Agency Contacted: 04/30/20 Time DME Agency Contacted: [phone] Representative spoke with at DME Agency: Ian Malkin            Social Determinants of Health (SDOH) Interventions     Readmission Risk Interventions No flowsheet data found.

## 2020-04-30 NOTE — Discharge Summary (Signed)
Patient ID: [name] MRN: [id number] DOB/AGE: 05-03-1961 59 y.o.  Admit date: 04/29/2020 Discharge date: 04/30/2020  Admission Diagnoses:  Principal Problem:   Unilateral primary osteoarthritis, left hip Active Problems:   Status post total replacement of left hip   Discharge Diagnoses:  Same  Past Medical History:  Diagnosis Date  . Acid reflux   . Arthritis   . COPD (chronic obstructive pulmonary disease) (HCC)   . Depression   . Hypertension   . Kidney stone   . Stroke [facility])     Surgeries: Procedure(s): LEFT TOTAL HIP ARTHROPLASTY ANTERIOR APPROACH on 04/29/2020   Consultants:   Discharged Condition: Improved  Hospital Course: [name] is an 59 y.o. male who was admitted 04/29/2020 for operative treatment ofUnilateral primary osteoarthritis, left hip. Patient has severe unremitting pain that affects sleep, daily activities, and work/hobbies. After pre-op clearance the patient was taken to the operating room on 04/29/2020 and underwent  Procedure(s): LEFT TOTAL HIP ARTHROPLASTY ANTERIOR APPROACH.    Patient was given perioperative antibiotics:  Anti-infectives (From admission, onward)   Start     Dose/Rate Route Frequency Ordered Stop   04/30/20 0600  clindamycin (CLEOCIN) IVPB 900 mg        900 mg 100 mL/hr over 30 Minutes Intravenous On call to O.R. 04/29/20 1215 04/29/20 1422   04/29/20 2030  clindamycin (CLEOCIN) IVPB 600 mg        600 mg 100 mL/hr over 30 Minutes Intravenous Every 6 hours 04/29/20 1540 04/30/20 0309       Patient was given sequential compression devices, early ambulation, and chemoprophylaxis to prevent DVT.  Patient benefited maximally from hospital stay and there were no complications.    Recent vital signs:  Patient Vitals for the past 24 hrs:  BP Temp Temp src Pulse Resp SpO2  04/30/20 0729 (!) 96/57 98.4 F (36.9 C) Oral 84 16 94 %  04/30/20 0425 103/62 98.9 F (37.2 C) Oral 84 20 99 %  04/29/20 2314 120/74 98.7 F (37.1 C) Oral  88 20 93 %  04/29/20 2018 134/76 98.4 F (36.9 C) Oral 97 18 92 %  04/29/20 1702 127/73 97.8 F (36.6 C) Oral 65 20 98 %  04/29/20 1645 121/72 97.7 F (36.5 C) -- 64 17 98 %  04/29/20 1630 116/64 -- -- 61 15 99 %  04/29/20 1615 108/67 -- -- (!) 58 20 99 %  04/29/20 1600 96/63 -- -- 66 17 98 %  04/29/20 1551 98/64 -- -- -- -- --  04/29/20 1545 (!) 87/59 97.6 F (36.4 C) -- 72 18 100 %     Recent laboratory studies:  Recent Labs    04/30/20 1044  WBC 8.0  HGB 9.3*  HCT 28.7*  PLT 308  NA 136  K 3.2*  CL 100  CO2 27  BUN 19  CREATININE 1.14  GLUCOSE 132*  CALCIUM 8.1*     Discharge Medications:   Allergies as of 04/30/2020      Reactions   Morphine And Related    Altered mental status   Penicillins Hives   Has patient had a PCN reaction causing immediate rash, facial/tongue/throat swelling, SOB or lightheadedness with hypotension: Yes Has patient had a PCN reaction causing severe rash involving mucus membranes or skin necrosis: No Has patient had a PCN reaction that required hospitalization: No Has patient had a PCN reaction occurring within the last 10 years: No If all of the above answers are "NO", then may proceed  with Cephalosporin use.      Medication List    STOP taking these medications   predniSONE 50 MG tablet Commonly known as: DELTASONE   traMADol 50 MG tablet Commonly known as: ULTRAM     TAKE these medications   albuterol 108 (90 Base) MCG/ACT inhaler Commonly known as: Proventil HFA INHALE 2 PUFFS BY MOUTH EVERY 6 HOURS AS NEEDED FOR COUGHING, WHEEZING, OR SHORTNESS OF BREATH What changed:   how much to take  how to take this  when to take this  reasons to take this  additional instructions   amLODipine 5 MG tablet Commonly known as: NORVASC Take 1 tablet (5 mg total) by mouth daily.   aspirin 81 MG chewable tablet Chew 1 tablet (81 mg total) by mouth 2 (two) times daily.   atorvastatin 40 MG tablet Commonly known as:  LIPITOR TAKE 1 Tablet BY MOUTH ONCE DAILY AT 6 PM What changed:   how much to take  how to take this  when to take this  additional instructions   citalopram 20 MG tablet Commonly known as: CELEXA Take 1 tablet (20 mg total) by mouth daily.   hydrocortisone 1 % ointment Apply 1 application topically 2 (two) times daily.   lisinopril 20 MG tablet Commonly known as: ZESTRIL Take 1 tablet (20 mg total) by mouth daily.   methocarbamol 500 MG tablet Commonly known as: ROBAXIN Take 1 tablet (500 mg total) by mouth every 6 (six) hours as needed for muscle spasms.   omeprazole 20 MG capsule Commonly known as: PRILOSEC TAKE 1 Capsule  BY MOUTH TWICE DAILY BEFORE MEALS What changed: See the new instructions.   oxyCODONE 5 MG immediate release tablet Commonly known as: Oxy IR/ROXICODONE Take 1-2 tablets (5-10 mg total) by mouth every 4 (four) hours as needed for moderate pain (pain score 4-6).   tamsulosin 0.4 MG Caps capsule Commonly known as: FLOMAX Take 1 capsule (0.4 mg total) by mouth at bedtime.            Durable Medical Equipment  (From admission, onward)         Start     Ordered   04/29/20 1659  DME 3 n 1  Once        04/29/20 1658   04/29/20 1659  DME Walker rolling  Once       Question Answer Comment  Walker: With 5 Inch Wheels   Patient needs a walker to treat with the following condition Status post total replacement of left hip      04/29/20 1658          Diagnostic Studies: DG Pelvis Portable  Result Date: 04/29/2020 CLINICAL DATA:  Post LEFT hip replacement surgery EXAM: PORTABLE PELVIS 1-2 VIEWS COMPARISON:  Portable exam 1557 hours compared intraoperative images of 04/29/2020 FINDINGS: LEFT hip prosthesis in expected position. Overlying skin clips and postsurgical soft tissue changes. No acute fracture, dislocation or bone destruction identified on single AP view. IMPRESSION: LEFT hip prosthesis without acute complication. Electronically Signed    By: Ulyses Southward M.D.   On: 04/29/2020 16:11   DG C-Arm 1-60 Min  Result Date: 04/29/2020 CLINICAL DATA:  59 year old male with total left hip arthroplasty. EXAM: OPERATIVE left HIP (WITH PELVIS IF PERFORMED) 1 VIEWS TECHNIQUE: Fluoroscopic spot image(s) were submitted for interpretation post-operatively. COMPARISON:  Left hip radiograph dated 01/09/2020. FINDINGS: Five intraoperative fluoroscopic images of the left hip provided. The total fluoroscopic time is 21 second and cumulative air [address]  of 2.86 mGy. There is a total left hip arthroplasty. IMPRESSION: Left hip arthroplasty. Electronically Signed   By: Elgie Collard M.D.   On: 04/29/2020 15:38   DG HIP OPERATIVE UNILAT W OR W/O PELVIS LEFT  Result Date: 04/29/2020 CLINICAL DATA:  59 year old male with total left hip arthroplasty. EXAM: OPERATIVE left HIP (WITH PELVIS IF PERFORMED) 1 VIEWS TECHNIQUE: Fluoroscopic spot image(s) were submitted for interpretation post-operatively. COMPARISON:  Left hip radiograph dated 01/09/2020. FINDINGS: Five intraoperative fluoroscopic images of the left hip provided. The total fluoroscopic time is 21 second and cumulative air [place] of 2.86 mGy. There is a total left hip arthroplasty. IMPRESSION: Left hip arthroplasty. Electronically Signed   By: Elgie Collard M.D.   On: 04/29/2020 15:38    Disposition: Discharge disposition: 01-Home or Self Care       Discharge Instructions    Ambulatory referral to Physical Therapy   Complete by: As directed        Follow-up Information    Kathryne Hitch, MD Follow up in 2 week(s).   Specialty: Orthopedic Surgery Contact information: [address] [place] [address] [phone]        [facility] Follow up.   Why: Rolling walker to be delivered to hospital room prior to discharge.  Contact information: [address] [place] [address] [phone]        [facility]. Call.    Specialty: Rehabilitation Why: Contact them if you have not heard from them in a week. Contact information: [address] [id number] [place] [address] [phone]               Signed: Kathryne Hitch 04/30/2020, 1:41 PM

## 2020-04-30 NOTE — Progress Notes (Signed)
Physical Therapy Treatment and Discharge  Patient Details Name: [name] MRN: [id number] DOB: 06-18-1961 Today's Date: 04/30/2020    History of Present Illness Pt is 59 yo male with PMH of L hip OA, COPD, CKD, hyperlipidemia, tobacco use disorder, R pontine CVA, and HTN (see H and P for full hx).  Pt admitted for L anterior THA on 04/29/20.    PT Comments    Pt progressing well with post-op mobility. Pt was instructed in HEP and handout was issued. He progressed to [facility] and demonstrated good sequencing and fluidity of gait pattern. He was educated on car transfer and general safety awareness with functional mobility at home. Pt has met acute PT goals and will d/c from our services at this time. If needs change, please reconsult.   Follow Up Recommendations  Follow surgeon's recommendation for DC plan and follow-up therapies;Supervision/Assistance - 24 hour     Equipment Recommendations  None Recommended by PT  Recommendations for Other Services       Precautions / Restrictions Precautions Precautions: Fall Restrictions Weight Bearing Restrictions: No LLE Weight Bearing: Weight bearing as tolerated    Mobility  Bed Mobility Overal bed mobility: Modified Independent Bed Mobility: Supine to Sit           General bed mobility comments: No assist required. Pt with increased time to get LE's off EOB, however was able to complete without difficulty.   Transfers Overall transfer level: Modified independent Equipment used: Straight cane Transfers: Sit to/from Stand           General transfer comment: Pt demonstrated proper hand placement on seated surface for safety. No assist required. No unsteadiness noted.   Ambulation/Gait Ambulation/Gait assistance: Modified independent (Device/Increase time) Gait Distance (Feet): 350 Feet Assistive device: Straight cane Gait Pattern/deviations: Decreased stride length;Step-through pattern;Decreased weight shift to left Gait  velocity: decreased   General Gait Details: VC's for improved posture and fluidity of gait pattern with the SPC. Pt was able to demonstrate good sequencing without unsteadiness or overt LOB.    Stairs             Wheelchair Mobility    Modified Rankin (Stroke Patients Only)       Balance Overall balance assessment: Needs assistance Sitting-balance support: No upper extremity supported;Feet supported Sitting balance-Leahy Scale: Good     Standing balance support: Bilateral upper extremity supported Standing balance-Leahy Scale: Fair Standing balance comment: no UE support required for minimally dynamic standing activity.                             Cognition Arousal/Alertness: Awake/alert Behavior During Therapy: WFL for tasks assessed/performed Overall Cognitive Status: Within Functional Limits for tasks assessed                                        Exercises Total Joint Exercises Ankle Circles/Pumps: 10 reps Quad Sets: 10 reps Heel Slides: 10 reps Hip ABduction/ADduction: 10 reps Long Arc Quad: 10 reps    General Comments        Pertinent Vitals/Pain Pain Assessment: Faces Faces Pain Scale: Hurts a little bit Pain Location: L hip Pain Descriptors / Indicators: Sore;Operative site guarding Pain Intervention(s): Limited activity within patient's tolerance;Monitored during session;Repositioned    Home Living  Prior Function            PT Goals (current goals can now be found in the care plan section) Acute Rehab PT Goals Patient Stated Goal: return home PT Goal Formulation: With patient/family Time For Goal Achievement: 05/13/20 Potential to Achieve Goals: Good Progress towards PT goals: Goals met/education completed, patient discharged from PT    Frequency    7X/week      PT Plan Current plan remains appropriate    Co-evaluation              AM-PAC PT "6 Clicks" Mobility    Outcome Measure  Help needed turning from your back to your side while in a flat bed without using bedrails?: None Help needed moving from lying on your back to sitting on the side of a flat bed without using bedrails?: None Help needed moving to and from a bed to a chair (including a wheelchair)?: None Help needed standing up from a chair using your arms (e.g., wheelchair or bedside chair)?: None Help needed to walk in hospital room?: None Help needed climbing 3-5 steps with a railing? : A Little 6 Click Score: 23    End of Session Equipment Utilized During Treatment: Gait belt Activity Tolerance: Patient tolerated treatment well Patient left: in chair;with call bell/phone within reach Nurse Communication: Mobility status PT Visit Diagnosis: Unsteadiness on feet (R26.81);Muscle weakness (generalized) (M62.81)     Time: 3241-9914 PT Time Calculation (min) (ACUTE ONLY): 24 min  Charges:  $Gait Training: 8-22 mins $Therapeutic Exercise: 8-22 mins                     Rolinda Roan, PT, DPT Acute Rehabilitation Services Pager: [phone] Office: [place] 04/30/2020, 11:45 AM

## 2020-04-30 NOTE — Plan of Care (Signed)
Patient alert and oriented, mae's well, voiding adequate amount of urine, swallowing without difficulty, no c/o pain at time of discharge. Patient discharged home with family. Script and discharged instructions given to patient. Patient and family stated understanding of instructions given. Patient has an appointment with Dr. Blackman   

## 2020-04-30 NOTE — Telephone Encounter (Signed)
Heather with outpatient rehab.in [place] would like to know when patient can start therapy?  Patient had left total hip surgery on 04/29/2020.  Cb# [phone] opt.1.  Please advise.  Thank you.

## 2020-04-30 NOTE — Progress Notes (Signed)
Subjective: 1 Day Post-Op Procedure(s) (LRB): LEFT TOTAL HIP ARTHROPLASTY ANTERIOR APPROACH (Left) Patient reports pain as moderate.    Objective: Vital signs in last 24 hours: Temp:  [97.6 F (36.4 C)-98.9 F (37.2 C)] 98.4 F (36.9 C) (07/07 0729) Pulse Rate:  [58-97] 84 (07/07 0729) Resp:  [15-20] 16 (07/07 0729) BP: (87-148)/(57-78) 96/57 (07/07 0729) SpO2:  [92 %-100 %] 94 % (07/07 0729) Weight:  [84.6 kg] 84.6 kg (07/06 1118)  Intake/Output from previous day: 07/06 0701 - 07/07 0700 In: 1900 [P.O.:100; I.V.:1650; IV Piggyback:150] Out: 200 [Urine:100; Blood:100] Intake/Output this shift: No intake/output data recorded.  No results for input(s): HGB in the last 72 hours. No results for input(s): WBC, RBC, HCT, PLT in the last 72 hours. No results for input(s): NA, K, CL, CO2, BUN, CREATININE, GLUCOSE, CALCIUM in the last 72 hours. No results for input(s): LABPT, INR in the last 72 hours.  Sensation intact distally Intact pulses distally Dorsiflexion/Plantar flexion intact Incision: scant drainage   Assessment/Plan: 1 Day Post-Op Procedure(s) (LRB): LEFT TOTAL HIP ARTHROPLASTY ANTERIOR APPROACH (Left) Up with therapy Discharge home with home health this afternoon.    Patient's anticipated LOS is less than 2 midnights, meeting these requirements: - Younger than 65 - Lives within 1 hour of care - Has a competent adult at home to recover with post-op recover - NO history of  - Chronic pain requiring opiods  - Diabetes  - Coronary Artery Disease  - Heart failure  - Heart attack  - Stroke  - DVT/VTE  - Cardiac arrhythmia  - Respiratory Failure/COPD  - Renal failure  - Anemia  - Advanced Liver disease       Kathryne Hitch 04/30/2020, 7:51 AM

## 2020-04-30 NOTE — Anesthesia Postprocedure Evaluation (Signed)
Anesthesia Post Note  Patient: [name]  Procedure(s) Performed: LEFT TOTAL HIP ARTHROPLASTY ANTERIOR APPROACH (Left Hip)     Patient location during evaluation: PACU Anesthesia Type: Spinal Level of consciousness: awake and alert Pain management: pain level controlled Vital Signs Assessment: post-procedure vital signs reviewed and stable Respiratory status: spontaneous breathing and respiratory function stable Cardiovascular status: blood pressure returned to baseline and stable Postop Assessment: spinal receding and no apparent nausea or vomiting Anesthetic complications: no   No complications documented.  Last Vitals:  Vitals:   04/30/20 0425 04/30/20 0729  BP: 103/62 (!) 96/57  Pulse: 84 84  Resp: 20 16  Temp: 37.2 C 36.9 C  SpO2: 99% 94%    Last Pain:  Vitals:   04/30/20 0729  TempSrc: Oral  PainSc:                  Beryle Lathe

## 2020-04-30 NOTE — TOC Initial Note (Addendum)
Transition of Care [facility]) - Initial/Assessment Note    Patient Details  Name: [name] MRN: [id number] Date of Birth: [birth year]  Transition of Care [facility]) CM/SW Contact:    Mearl Latin, LCSW Phone Number: 04/30/2020, 8:55 AM  Clinical Narrative:                 8:55am-CSW received consult for Home Health and DME. Patient resides at home with his wife who will be able to provide support. CSW sent referral to [facility], who is providing charity services this week for HHPT. They will review the referral and follow up with CSW. Patient recommended for a rolling walker; CSW sent referral to [employer] who will complete hardship form with patient and deliver walker to patient's room.   10:13am-CSW notified by PT that patient no longer needs a walker and is walking with a cane. CSW cancelled walker with [employer].  Expected Discharge Plan: Home w Home Health Services Barriers to Discharge: Inadequate or no insurance, Equipment Delay   Patient Goals and CMS Choice Patient states their goals for this hospitalization and ongoing recovery are:: Rehab CMS Medicare.gov Compare Post Acute Care list provided to:: Patient Choice offered to / list presented to : Patient  Expected Discharge Plan and Services Expected Discharge Plan: Home w Home Health Services In-house Referral: Clinical Social Work Discharge Planning Services: CM Consult Post Acute Care Choice: Durable Medical Equipment, Home Health Living arrangements for the past 2 months: Mobile Home Expected Discharge Date: 04/30/20               DME Arranged: Dan Humphreys rolling DME Agency: [employer] Date DME Agency Contacted: 04/30/20 Time DME Agency Contacted: [phone] Representative spoke with at DME Agency: Ian Malkin            Prior Living Arrangements/Services Living arrangements for the past 2 months: Mobile Home Lives with:: Spouse Patient language and need for interpreter reviewed:: Yes Do you feel safe going back to the  place where you live?: Yes      Need for Family Participation in Patient Care: Yes (Comment) Care giver support system in place?: Yes (comment)   Criminal Activity/Legal Involvement Pertinent to Current Situation/Hospitalization: No - Comment as needed  Activities of Daily Living      Permission Sought/Granted Permission sought to share information with : Photographer granted to share info w AGENCY: Home Health/DME        Emotional Assessment Appearance:: Appears stated age Attitude/Demeanor/Rapport: Engaged Affect (typically observed): Accepting, Appropriate Orientation: : Oriented to Self, Oriented to Place, Oriented to  Time, Oriented to Situation Alcohol / Substance Use: Not Applicable Psych Involvement: No (comment)  Admission diagnosis:  Status post total replacement of left hip [Z96.642] Patient Active Problem List   Diagnosis Date Noted  . Status post total replacement of left hip 04/29/2020  . Unilateral primary osteoarthritis, left hip 03/05/2020  . COPD (chronic obstructive pulmonary disease) (HCC) 04/11/2019  . 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 Labile blood pressure   . CKD (chronic kidney disease), stage II   . Benign prostatic hyperplasia   . Hyperlipidemia 08/14/2018  . Tobacco use disorder 08/14/2018  . Obesity 08/14/2018  . GERD (gastroesophageal reflux disease) 08/14/2018  . Leg cramp, left 08/14/2018  . Diastolic dysfunction   . Polysubstance abuse (HCC)   . Right pontine cerebrovascular accident [facility]) s/p tPA 08/11/2018  . Hypertension 02/17/2014   PCP:  Jacquelin Hawking, PA-C Pharmacy:  [facility] [address] [place] - [address] [place] [address] Phone: [phone] Fax: [phone]  [facility] [place], [place] - [address] [place] [address] [place] [address] Phone: [phone] Fax: [phone]     Social  Determinants of Health (SDOH) Interventions    Readmission Risk Interventions No flowsheet data found.

## 2020-04-30 NOTE — Telephone Encounter (Signed)
You guys can start PT as soon as you can get him in. His insurance will not pay for HHPT, so as soon as he can get there, the better! :)

## 2020-05-02 ENCOUNTER — Ambulatory Visit: Payer: Self-pay | Attending: Orthopaedic Surgery | Admitting: Physical Therapy

## 2020-05-02 ENCOUNTER — Encounter: Payer: Self-pay | Admitting: Physical Therapy

## 2020-05-02 ENCOUNTER — Other Ambulatory Visit: Payer: Self-pay

## 2020-05-02 DIAGNOSIS — R262 Difficulty in walking, not elsewhere classified: Secondary | ICD-10-CM | POA: Insufficient documentation

## 2020-05-02 DIAGNOSIS — M6281 Muscle weakness (generalized): Secondary | ICD-10-CM | POA: Insufficient documentation

## 2020-05-02 DIAGNOSIS — M25552 Pain in left hip: Secondary | ICD-10-CM | POA: Insufficient documentation

## 2020-05-02 NOTE — Therapy (Signed)
[facility] [address] [place], [place], [address] Phone: [phone]   Fax:  [phone]  Physical Therapy Evaluation  Patient Details  Name: [name] MRN: [id number] Date of Birth: Mar 30, 1961 Referring Provider (PT): Doneen Poisson MD.   Encounter Date: 05/02/2020   PT End of Session - 05/02/20 1242    Visit Number 1    Number of Visits 12    Date for PT Re-Evaluation 06/20/20    PT Start Time 1115    PT Stop Time 1142    PT Time Calculation (min) 27 min    Activity Tolerance Patient tolerated treatment well    Behavior During Therapy [facility] for tasks assessed/performed           Past Medical History:  Diagnosis Date  . Acid reflux   . Arthritis   . COPD (chronic obstructive pulmonary disease) (HCC)   . Depression   . Hypertension   . Kidney stone   . Stroke [facility])     Past Surgical History:  Procedure Laterality Date  . CHOLECYSTECTOMY      There were no vitals filed for this visit.    Subjective Assessment - 05/02/20 1244    Subjective COVID-19 screen performed prior to patient entering clinic.  The patient patient presents tot he clinic today s/p left anterior total hip replacement performed on 04/29/20.  He is currently in a lot of pain rated at an 8/10 today.  He is wbat over his left LE with bilateral axillary crutches.  He has been complinat to hsi HEP comprised of ankle pumps, QS's, SAQ's, heel slides and supine hip abduction.    Pertinent History CVA in 2019 that affected his left side and he used a AFO at times, HTN, OA, COPD, GERD.    Limitations Walking    How long can you walk comfortably? Short distances around home with bilateral axillary crutches.    Patient Stated Goals Get around without left hip pain.    Currently in Pain? Yes    Pain Score 8     Pain Location Hip    Pain Orientation Left    Pain Descriptors / Indicators Sore;Throbbing    Pain Type Surgical pain    Pain Onset In the past 7  days    Pain Frequency Constant    Aggravating Factors  Increased up time.    Pain Relieving Factors "Nothing."              [facility] PT Assessment - 05/02/20 0001      Assessment   Medical Diagnosis Left anterior total hip replacement.    Referring Provider (PT) Doneen Poisson MD.    Onset Date/Surgical Date --   04/29/20 (surgery date).     Precautions   Precautions --   Left hip precautions.  No ultrasound.     Restrictions   Weight Bearing Restrictions No      Balance Screen   Has the patient fallen in the past 6 months No    Has the patient had a decrease in activity level because of a fear of falling?  No    Is the patient reluctant to leave their home because of a fear of falling?  No      Prior Function   Level of Independence Independent      ROM / Strength   AROM / PROM / Strength PROM;Strength      AROM   Overall AROM Comments In supine left hip PAROM  flexion to 70 degrees.      Strength   Overall Strength Comments Patient able to perform left ankle ankle pumps and performed antigravity short arc quads.  Left hip abduction in supine grossly graded at 2+/5 today.      Palpation   Palpation comment Left hip Aquacel intact.  Patient reports diffuse left hip pain currently.      Special Tests   Other special tests (=) leg lengths.      Bed Mobility   Bed Mobility --   Pt requiring min+ assist for sit to supine to sit transition     Ambulation/Gait   Gait Comments Antalgic step-to gait pattern with bilateral axillary crutuches with patient wbat over his left LE.                      Objective measurements completed on examination: See above findings.                 PT Short Term Goals - 08/30/18 1359      PT SHORT TERM GOAL #1   Title Patient independent in HEP performance to independent carry over at home to increase LE strength and balance and return to PLOF.    Time 1    Period Days    Status New    Target Date  08/29/18             PT Long Term Goals - 05/02/20 1305      PT LONG TERM GOAL #1   Title Indepedent with a HEP.    Time 6    Period Weeks    Status New      PT LONG TERM GOAL #2   Title Perform ADL's with left hip pain not exceeding a 2-3/10.    Time 6    Period Weeks    Status New      PT LONG TERM GOAL #3   Title Walk a community distance without assistive device.    Time 6    Period Weeks    Status New      PT LONG TERM GOAL #4   Title Perform a reciprocating stair gait with one railing with pain not > 2-3/10.    Time 6    Period Weeks    Status New                  Plan - 05/02/20 1259    Clinical Impression Statement The patient presents to OPPT s/p left anterior total hip athroplasty performed on 04/29/20. He is still in a great deal of pain.  He is walking wbat over his left LE with bilateral axillary crutuches with a step-to gait pattern.  Patient required min+ assist to his left LE for sit to supine to sit transitions.  Aquacel is intact over hsi left hip incision.  He is lacking an expected loss of left LE strength and range of motion at this time.  Patient will benefit from skilled physical therapy intervention to address deficits and pain.    Personal Factors and Comorbidities Comorbidity 1;Comorbidity 2;Comorbidity 3+    Comorbidities CVA in 2019 that affected his left side and he used a AFO at times, HTN, OA, COPD, GERD.    Examination-Activity Limitations Locomotion Level;Transfers;Other    Examination-Participation Restrictions Other    Stability/Clinical Decision Making Stable/Uncomplicated    Clinical Decision Making Low    Rehab Potential Good    PT Duration 6 weeks  PT Treatment/Interventions ADLs/Self Care Home Management;Cryotherapy;Electrical Stimulation;Moist Heat;Gait training;Stair training;Functional mobility training;Therapeutic activities;Therapeutic exercise;Neuromuscular re-education;Manual techniques;Patient/family  education;Passive range of motion    PT Home Exercise Plan Nustep, gentle left LE strengthening, parallel bar activites for ther ex and LE ther ex.  Electrical if needed.  Gait training.    Consulted and Agree with Plan of Care Patient           Patient will benefit from skilled therapeutic intervention in order to improve the following deficits and impairments:  Pain, Decreased activity tolerance, Decreased strength, Decreased range of motion, Abnormal gait  Visit Diagnosis: Pain in left hip - Plan: PT plan of care cert/re-cert  Muscle weakness (generalized) - Plan: PT plan of care cert/re-cert  Difficulty in walking, not elsewhere classified - Plan: PT plan of care cert/re-cert     Problem List Patient Active Problem List   Diagnosis Date Noted  . Status post total replacement of left hip 04/29/2020  . Unilateral primary osteoarthritis, left hip 03/05/2020  . COPD (chronic obstructive pulmonary disease) (HCC) 04/11/2019  . 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 Labile blood pressure   . CKD (chronic kidney disease), stage II   . Benign prostatic hyperplasia   . Hyperlipidemia 08/14/2018  . Tobacco use disorder 08/14/2018  . Obesity 08/14/2018  . GERD (gastroesophageal reflux disease) 08/14/2018  . Leg cramp, left 08/14/2018  . Diastolic dysfunction   . Polysubstance abuse (HCC)   . Right pontine cerebrovascular accident [facility]) s/p tPA 08/11/2018  . Hypertension 02/17/2014    Sevana Grandinetti, [place] MPT 05/02/2020, 1:09 PM  [facility] [address] [place], [place], [address] Phone: [phone]   Fax:  [phone]  Name: [name] MRN: [id number] Date of Birth: 1961/07/19

## 2020-05-05 ENCOUNTER — Encounter (HOSPITAL_COMMUNITY): Payer: Self-pay | Admitting: Orthopaedic Surgery

## 2020-05-07 ENCOUNTER — Ambulatory Visit: Payer: Self-pay | Admitting: Physical Therapy

## 2020-05-07 ENCOUNTER — Encounter: Payer: Self-pay | Admitting: Physical Therapy

## 2020-05-07 ENCOUNTER — Other Ambulatory Visit: Payer: Self-pay

## 2020-05-07 DIAGNOSIS — M25552 Pain in left hip: Secondary | ICD-10-CM

## 2020-05-07 DIAGNOSIS — R262 Difficulty in walking, not elsewhere classified: Secondary | ICD-10-CM

## 2020-05-07 DIAGNOSIS — M6281 Muscle weakness (generalized): Secondary | ICD-10-CM

## 2020-05-07 NOTE — Therapy (Signed)
[facility] [address] [place], [place], [address] Phone: [phone]   Fax:  [phone]  Physical Therapy Treatment  Patient Details  Name: [name] MRN: [id number] Date of Birth: [birth year] Referring Provider (PT): Doneen Poisson MD.   Encounter Date: 05/07/2020   PT End of Session - 05/07/20 1438    Visit Number 2    Number of Visits 12    Date for PT Re-Evaluation 06/20/20    Authorization Type self-pay    PT Start Time 1436    PT Stop Time 1515    PT Time Calculation (min) 39 min    Activity Tolerance Patient tolerated treatment well    Behavior During Therapy [facility] for tasks assessed/performed           Past Medical History:  Diagnosis Date  . Acid reflux   . Arthritis   . COPD (chronic obstructive pulmonary disease) (HCC)   . Depression   . Hypertension   . Kidney stone   . Stroke [facility])     Past Surgical History:  Procedure Laterality Date  . CHOLECYSTECTOMY    . TOTAL HIP ARTHROPLASTY Left 04/29/2020   Procedure: LEFT TOTAL HIP ARTHROPLASTY ANTERIOR APPROACH;  Surgeon: Kathryne Hitch, MD;  Location: [facility] OR;  Service: Orthopedics;  Laterality: Left;    There were no vitals filed for this visit.   Subjective Assessment - 05/07/20 1426    Subjective COVID 19 screening performed on patient upon arrival. Reports some soreness and muscle spasms and is taking muscle relaxer as directed.    Pertinent History CVA in 2019 that affected his left side and he used a AFO at times, HTN, OA, COPD, GERD.    Limitations Walking    How long can you walk comfortably? Short distances around home with bilateral axillary crutches.    Patient Stated Goals Get around without left hip pain.    Currently in Pain? Yes    Pain Score 5     Pain Location Hip    Pain Orientation Left    Pain Descriptors / Indicators Sore;Spasm    Pain Type Surgical pain    Pain Onset 1 to 4 weeks ago    Pain Frequency Constant               [facility] PT Assessment - 05/07/20 0001      Assessment   Medical Diagnosis Left anterior total hip replacement.    Referring Provider (PT) Doneen Poisson MD.    Onset Date/Surgical Date 04/29/20    Next MD Visit 05/12/2020      Restrictions   Weight Bearing Restrictions No                         [facility] Adult PT Treatment/Exercise - 05/07/20 0001      Exercises   Exercises Knee/Hip      Knee/Hip Exercises: Aerobic   Nustep L3, seat 9 x10 min      Knee/Hip Exercises: Standing   Hip Flexion AROM;Left;20 reps;Knee bent    Hip Abduction AROM;Left;20 reps;Knee straight    Hip Extension AROM;Left;20 reps;Knee straight      Knee/Hip Exercises: Seated   Long Arc Quad AROM;Left;2 sets;10 reps    [employer] x20 reps    Marching AROM;Left;15 reps      Knee/Hip Exercises: Supine   Heel Slides AROM;Left;15 reps      Modalities   Modalities Electrical Stimulation;Cryotherapy  Cryotherapy   Number Minutes Cryotherapy 10 Minutes    Cryotherapy Location Hip    Type of Cryotherapy Ice pack      Electrical Stimulation   Electrical Stimulation Location L lateral hip    Electrical Stimulation Action Pre-Mod    Electrical Stimulation Parameters 80-150 hz x10 min    Electrical Stimulation Goals Pain                    PT Short Term Goals - 08/30/18 1359      PT SHORT TERM GOAL #1   Title Patient independent in HEP performance to independent carry over at home to increase LE strength and balance and return to PLOF.    Time 1    Period Days    Status New    Target Date 08/29/18             PT Long Term Goals - 05/02/20 1305      PT LONG TERM GOAL #1   Title Indepedent with a HEP.    Time 6    Period Weeks    Status New      PT LONG TERM GOAL #2   Title Perform ADL's with left hip pain not exceeding a 2-3/10.    Time 6    Period Weeks    Status New      PT LONG TERM GOAL #3   Title Walk a community distance without  assistive device.    Time 6    Period Weeks    Status New      PT LONG TERM GOAL #4   Title Perform a reciprocating stair gait with one railing with pain not > 2-3/10.    Time 6    Period Weeks    Status New                 Plan - 05/07/20 1503    Clinical Impression Statement Patient presented in clinic with reports of mid range L hip soreness. Patient ambulated into clinic without any AD and min to mod antalgic gait depending on fatigue levels. Patient guided through light ROM and strengthening exercises with only reports of discomfort and fatigue from therex. Patient able to lift LLE from sit > supine without assistance from PTA. Patient reports compliance with icing at home. Aquacell bandage in place over L hip incision. Normal modalities response noted following removal of the modalities.    Personal Factors and Comorbidities Comorbidity 1;Comorbidity 2;Comorbidity 3+    Comorbidities CVA in 2019 that affected his left side and he used a AFO at times, HTN, OA, COPD, GERD.    Examination-Activity Limitations Locomotion Level;Transfers;Other    Examination-Participation Restrictions Other    Stability/Clinical Decision Making Stable/Uncomplicated    Rehab Potential Good    PT Duration 6 weeks    PT Treatment/Interventions ADLs/Self Care Home Management;Cryotherapy;Electrical Stimulation;Moist Heat;Gait training;Stair training;Functional mobility training;Therapeutic activities;Therapeutic exercise;Neuromuscular re-education;Manual techniques;Patient/family education;Passive range of motion    PT Next Visit Plan Continue with L hip strengthening per protocol and modalities PRN.    Consulted and Agree with Plan of Care Patient           Patient will benefit from skilled therapeutic intervention in order to improve the following deficits and impairments:  Pain, Decreased activity tolerance, Decreased strength, Decreased range of motion, Abnormal gait  Visit Diagnosis: Pain in  left hip  Muscle weakness (generalized)  Difficulty in walking, not elsewhere classified     Problem List Patient  Active Problem List   Diagnosis Date Noted  . Status post total replacement of left hip 04/29/2020  . Unilateral primary osteoarthritis, left hip 03/05/2020  . COPD (chronic obstructive pulmonary disease) (HCC) 04/11/2019  . 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 Labile blood pressure   . CKD (chronic kidney disease), stage II   . Benign prostatic hyperplasia   . Hyperlipidemia 08/14/2018  . Tobacco use disorder 08/14/2018  . Obesity 08/14/2018  . GERD (gastroesophageal reflux disease) 08/14/2018  . Leg cramp, left 08/14/2018  . Diastolic dysfunction   . Polysubstance abuse (HCC)   . Right pontine cerebrovascular accident [facility]) s/p tPA 08/11/2018  . Hypertension 02/17/2014    Marvell Fuller, PTA 05/07/2020, 3:24 PM  [facility] [address] [place], [place], [address] Phone: [phone]   Fax:  [phone]  Name: SHALOM MCGUINESS MRN: [id number] Date of Birth: 12/27/1960

## 2020-05-09 ENCOUNTER — Other Ambulatory Visit: Payer: Self-pay | Admitting: Physician Assistant

## 2020-05-09 DIAGNOSIS — Z131 Encounter for screening for diabetes mellitus: Secondary | ICD-10-CM

## 2020-05-09 DIAGNOSIS — I10 Essential (primary) hypertension: Secondary | ICD-10-CM

## 2020-05-09 DIAGNOSIS — R7309 Other abnormal glucose: Secondary | ICD-10-CM

## 2020-05-09 DIAGNOSIS — E785 Hyperlipidemia, unspecified: Secondary | ICD-10-CM

## 2020-05-12 ENCOUNTER — Inpatient Hospital Stay: Payer: Self-pay | Admitting: Orthopaedic Surgery

## 2020-05-13 ENCOUNTER — Other Ambulatory Visit: Payer: Self-pay

## 2020-05-13 ENCOUNTER — Ambulatory Visit (INDEPENDENT_AMBULATORY_CARE_PROVIDER_SITE_OTHER): Payer: Self-pay | Admitting: Orthopaedic Surgery

## 2020-05-13 ENCOUNTER — Encounter: Payer: Self-pay | Admitting: Orthopaedic Surgery

## 2020-05-13 DIAGNOSIS — Z96642 Presence of left artificial hip joint: Secondary | ICD-10-CM

## 2020-05-13 MED ORDER — HYDROCODONE-ACETAMINOPHEN 5-325 MG PO TABS: 1.0000 | ORAL_TABLET | Freq: Four times a day (QID) | ORAL | 0 refills | Status: DC | PRN

## 2020-05-13 NOTE — Progress Notes (Signed)
HPI: Mr. Shayne Alken returns today 2 weeks status post left total hip arthroplasty.  He overall is doing well he is using no assistive device to ambulate.  He is not taking any pain medication.  He is having difficulty sleeping.  He denies any fevers chills shortness of breath chest pain.   Physical exam: Left hip good range of motion.  Surgical incisions healing well well approximated with staples without any signs of dehiscence or infection.  Left calf supple nontender.  Swelling about the incision site consistent with seroma however aspiration attempt ends in a dry tap.   Impression: Status post left total hip arthroplasty 04/29/2020  Plan: Staples removed Steri-Strips applied.  Work on Social research officer, government.  Continue work on ambulation.  Did discontinue his Percocet he is given Norco for pain.  Follow-up with [place] in 1 month sooner if there is any questions concerns.  Recommended he to the hip to help with swelling.

## 2020-05-14 ENCOUNTER — Other Ambulatory Visit: Payer: Self-pay

## 2020-05-14 ENCOUNTER — Ambulatory Visit: Payer: Self-pay | Admitting: Physical Therapy

## 2020-05-14 ENCOUNTER — Encounter: Payer: Self-pay | Admitting: Physical Therapy

## 2020-05-14 DIAGNOSIS — M25552 Pain in left hip: Secondary | ICD-10-CM

## 2020-05-14 DIAGNOSIS — M6281 Muscle weakness (generalized): Secondary | ICD-10-CM

## 2020-05-14 DIAGNOSIS — R262 Difficulty in walking, not elsewhere classified: Secondary | ICD-10-CM

## 2020-05-14 NOTE — Therapy (Signed)
[facility] [address] [place], [place], [address] Phone: [phone]   Fax:  [phone]  Physical Therapy Treatment  Patient Details  Name: [name] MRN: [id number] Date of Birth: 02/22/1961 Referring Provider (PT): Doneen Poisson MD.   Encounter Date: 05/14/2020   PT End of Session - 05/14/20 0912    Visit Number 3    Number of Visits 12    Date for PT Re-Evaluation 06/20/20    Authorization Type self-pay    PT Start Time 0902    PT Stop Time 0943    PT Time Calculation (min) 41 min    Activity Tolerance Patient tolerated treatment well    Behavior During Therapy [facility] for tasks assessed/performed           Past Medical History:  Diagnosis Date  . Acid reflux   . Arthritis   . COPD (chronic obstructive pulmonary disease) (HCC)   . Depression   . Hypertension   . Kidney stone   . Stroke [facility])     Past Surgical History:  Procedure Laterality Date  . CHOLECYSTECTOMY    . TOTAL HIP ARTHROPLASTY Left 04/29/2020   Procedure: LEFT TOTAL HIP ARTHROPLASTY ANTERIOR APPROACH;  Surgeon: Kathryne Hitch, MD;  Location: [facility] OR;  Service: Orthopedics;  Laterality: Left;    There were no vitals filed for this visit.   Subjective Assessment - 05/14/20 0912    Subjective COVID 19 screening performed on patient upon arrival. Reports his hip feeling better. Bandage and staples removed yesterday and they attempted to remove fluid but unable to. Patient and wife both reported that they were instructed to use heat to reduce fluid.    Patient is accompained by: Family member   Wife   Pertinent History CVA in 2019 that affected his left side and he used a AFO at times, HTN, OA, COPD, GERD.    Limitations Walking    How long can you walk comfortably? Short distances around home with bilateral axillary crutches.    Patient Stated Goals Get around without left hip pain.    Currently in Pain? Yes    Pain Score 4     Pain  Location Hip    Pain Orientation Left    Pain Descriptors / Indicators Sore    Pain Type Surgical pain    Pain Onset 1 to 4 weeks ago    Pain Frequency Constant              [facility] PT Assessment - 05/14/20 0001      Assessment   Medical Diagnosis Left anterior total hip replacement.    Referring Provider (PT) Doneen Poisson MD.    Onset Date/Surgical Date 04/29/20    Hand Dominance Right      Restrictions   Weight Bearing Restrictions No                         [facility] Adult PT Treatment/Exercise - 05/14/20 0001      Knee/Hip Exercises: Aerobic   Nustep L3, seat 8 x10 min      Knee/Hip Exercises: Standing   Hip Flexion AROM;Left;20 reps;Knee bent    Hip Abduction AROM;Left;20 reps;Knee straight    Hip Extension AROM;Left;20 reps;Knee straight    Lateral Step Up Left;2 sets;10 reps;Hand Hold: 2;Step Height: 6"    Forward Step Up Left;20 reps;Hand Hold: 2;Step Height: 6"      Knee/Hip Exercises: Seated   Long [employer]  Strengthening;Left;20 reps;Weights    Long [employer] Weight 4 lbs.      Knee/Hip Exercises: Supine   Hip Adduction Isometric Strengthening;Both;20 reps    Bridges Strengthening;Both;15 reps      Modalities   Modalities Electrical Stimulation;Moist Heat      Moist Heat Therapy   Number Minutes Moist Heat 15 Minutes   per instructions by MD per patient and wife   Moist Heat Location Hip      Electrical Stimulation   Electrical Stimulation Location L lateral hip    Electrical Stimulation Action Pre-Mod    Electrical Stimulation Parameters 80-150 hz x15 min    Electrical Stimulation Goals Pain;Edema                       PT Long Term Goals - 05/02/20 1305      PT LONG TERM GOAL #1   Title Indepedent with a HEP.    Time 6    Period Weeks    Status New      PT LONG TERM GOAL #2   Title Perform ADL's with left hip pain not exceeding a 2-3/10.    Time 6    Period Weeks    Status New      PT LONG TERM GOAL #3    Title Walk a community distance without assistive device.    Time 6    Period Weeks    Status New      PT LONG TERM GOAL #4   Title Perform a reciprocating stair gait with one railing with pain not > 2-3/10.    Time 6    Period Weeks    Status New                 Plan - 05/14/20 0951    Clinical Impression Statement Patient presented in clinic with only reports of soreness of R hip. No AD utilized for ambulation but with min to mod antalgic deviation such as less LLE weightbearing. in stance. Patient guided through AROM exercises today with reports of fatigue mostly and minimal discomfort during treatment. Aquacell is now removed but steristrips and small patch is donned to L hip incision. A small blister is located at the medial region of the inferior most incision roughly 0.5 cm width. Patient and wife both report that following attempt at removing fluid from hip that they were instructed to use heat by MD. Normal modalties response noted following removal of the modalities.    Personal Factors and Comorbidities Comorbidity 1;Comorbidity 2;Comorbidity 3+    Comorbidities CVA in 2019 that affected his left side and he used a AFO at times, HTN, OA, COPD, GERD.    Examination-Activity Limitations Locomotion Level;Transfers;Other    Examination-Participation Restrictions Other    Stability/Clinical Decision Making Stable/Uncomplicated    Rehab Potential Good    PT Duration 6 weeks    PT Treatment/Interventions ADLs/Self Care Home Management;Cryotherapy;Electrical Stimulation;Moist Heat;Gait training;Stair training;Functional mobility training;Therapeutic activities;Therapeutic exercise;Neuromuscular re-education;Manual techniques;Patient/family education;Passive range of motion    PT Next Visit Plan Continue with L hip strengthening per protocol and modalities PRN.    PT Home Exercise Plan Nustep, gentle left LE strengthening, parallel bar activites for ther ex and LE ther ex.   Electrical if needed.  Gait training.    Consulted and Agree with Plan of Care Patient           Patient will benefit from skilled therapeutic intervention in order to improve the following deficits  and impairments:  Pain, Decreased activity tolerance, Decreased strength, Decreased range of motion, Abnormal gait  Visit Diagnosis: Pain in left hip  Muscle weakness (generalized)  Difficulty in walking, not elsewhere classified     Problem List Patient Active Problem List   Diagnosis Date Noted  . Status post total replacement of left hip 04/29/2020  . Unilateral primary osteoarthritis, left hip 03/05/2020  . COPD (chronic obstructive pulmonary disease) (HCC) 04/11/2019  . 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 Labile blood pressure   . CKD (chronic kidney disease), stage II   . Benign prostatic hyperplasia   . Hyperlipidemia 08/14/2018  . Tobacco use disorder 08/14/2018  . Obesity 08/14/2018  . GERD (gastroesophageal reflux disease) 08/14/2018  . Leg cramp, left 08/14/2018  . Diastolic dysfunction   . Polysubstance abuse (HCC)   . 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 Hypertension 02/17/2014    Marvell Fuller, PTA 05/14/2020, 9:57 AM  [facility] [address] [place], [place], [address] Phone: [phone]   Fax:  [phone]  Name: [name] MRN: [id number] Date of Birth: 05/02/1961

## 2020-05-16 ENCOUNTER — Ambulatory Visit (HOSPITAL_COMMUNITY): Payer: Self-pay | Attending: Cardiology

## 2020-05-16 ENCOUNTER — Other Ambulatory Visit: Payer: Self-pay

## 2020-05-16 DIAGNOSIS — R011 Cardiac murmur, unspecified: Secondary | ICD-10-CM | POA: Insufficient documentation

## 2020-05-16 LAB — ECHOCARDIOGRAM COMPLETE
Area-P 1/2: 4.8 cm2
S' Lateral: 2.8 cm

## 2020-05-19 ENCOUNTER — Other Ambulatory Visit (HOSPITAL_COMMUNITY)
Admission: RE | Admit: 2020-05-19 | Discharge: 2020-05-19 | Disposition: A | Discharge status: Home / Self Care | Payer: Self-pay | Source: Ambulatory Visit | Attending: Physician Assistant | Admitting: Physician Assistant

## 2020-05-19 ENCOUNTER — Telehealth: Payer: Self-pay | Admitting: Orthopaedic Surgery

## 2020-05-19 ENCOUNTER — Ambulatory Visit: Payer: Self-pay | Admitting: Physician Assistant

## 2020-05-19 ENCOUNTER — Other Ambulatory Visit: Payer: Self-pay

## 2020-05-19 ENCOUNTER — Encounter: Payer: Self-pay | Admitting: Physician Assistant

## 2020-05-19 VITALS — BP 130/70 | HR 101 | Temp 97.7°F | Ht 68.5 in | Wt 176.5 lb

## 2020-05-19 DIAGNOSIS — Z125 Encounter for screening for malignant neoplasm of prostate: Secondary | ICD-10-CM

## 2020-05-19 DIAGNOSIS — J449 Chronic obstructive pulmonary disease, unspecified: Secondary | ICD-10-CM

## 2020-05-19 DIAGNOSIS — Z131 Encounter for screening for diabetes mellitus: Secondary | ICD-10-CM | POA: Insufficient documentation

## 2020-05-19 DIAGNOSIS — I10 Essential (primary) hypertension: Secondary | ICD-10-CM | POA: Insufficient documentation

## 2020-05-19 DIAGNOSIS — F17200 Nicotine dependence, unspecified, uncomplicated: Secondary | ICD-10-CM

## 2020-05-19 DIAGNOSIS — E785 Hyperlipidemia, unspecified: Secondary | ICD-10-CM

## 2020-05-19 DIAGNOSIS — R7309 Other abnormal glucose: Secondary | ICD-10-CM | POA: Insufficient documentation

## 2020-05-19 LAB — LIPID PANEL
Cholesterol: 139 mg/dL (ref 0–200)
HDL: 31 mg/dL — ABNORMAL LOW (ref >40)
LDL Cholesterol: 85 mg/dL (ref 0–99)
Total CHOL/HDL Ratio: 4.5 RATIO
Triglycerides: 116 mg/dL (ref <150)
VLDL: 23 mg/dL (ref 0–40)

## 2020-05-19 LAB — COMPREHENSIVE METABOLIC PANEL
ALT: 15 U/L (ref 0–44)
AST: 15 U/L (ref 15–41)
Albumin: 3.2 g/dL — ABNORMAL LOW (ref 3.5–5.0)
Alkaline Phosphatase: 116 U/L (ref 38–126)
Anion gap: 10 (ref 5–15)
BUN: 15 mg/dL (ref 6–20)
CO2: 26 mmol/L (ref 22–32)
Calcium: 8.8 mg/dL — ABNORMAL LOW (ref 8.9–10.3)
Chloride: 101 mmol/L (ref 98–111)
Creatinine, Ser: 0.78 mg/dL (ref 0.61–1.24)
GFR calc Af Amer: >60 mL/min (ref >60)
GFR calc non Af Amer: >60 mL/min (ref >60)
Glucose, Bld: 113 mg/dL — ABNORMAL HIGH (ref 70–99)
Potassium: 3.3 mmol/L — ABNORMAL LOW (ref 3.5–5.1)
Sodium: 137 mmol/L (ref 135–145)
Total Bilirubin: 0.3 mg/dL (ref 0.3–1.2)
Total Protein: 7.3 g/dL (ref 6.5–8.1)

## 2020-05-19 LAB — HEMOGLOBIN A1C
Hgb A1c MFr Bld: 5.7 % — ABNORMAL HIGH (ref 4.8–5.6)
Mean Plasma Glucose: 116.89 mg/dL

## 2020-05-19 NOTE — Progress Notes (Signed)
BP (!) 130/70   Pulse 101   Temp 97.7 F (36.5 C)   Ht 5' 8.5" (1.74 m)   Wt 176 lb 8 oz (80.1 kg)   SpO2 98%   BMI 26.45 kg/m    Subjective:    Patient ID: [name], male    DOB: 1960-10-27, 59 y.o.   MRN: [id number]  HPI: [name] is a 59 y.o. male presenting on 05/19/2020 for Follow-up   HPI     Pt had a negative covid 19 screening questionnaire.      Pt is 58yoM with HTN, dyslipiemia, GERD, depression, COPD, history of polysubstance abuse, and current Tobacco use disorder.  pt recently had L hip ORIF.   He is still having some pain from the recent surgery.  He is "down" to smoking 1ppd.    He got both doses of covid vaccination.  He is not taking his citalopram due to cost.  He says he is okay off it.      Relevant past medical, surgical, family and social history reviewed and updated as indicated. Interim medical history since our last visit reviewed. Allergies and medications reviewed and updated.   Current Outpatient Medications:  .  albuterol (PROVENTIL HFA) 108 (90 Base) MCG/ACT inhaler, INHALE 2 PUFFS BY MOUTH EVERY 6 HOURS AS NEEDED FOR COUGHING, WHEEZING, OR SHORTNESS OF BREATH (Patient taking differently: Inhale 2 puffs into the lungs every 6 (six) hours as needed for wheezing or shortness of breath. ), Disp: 20.1 g, Rfl: 1 .  amLODipine (NORVASC) 5 MG tablet, Take 1 tablet (5 mg total) by mouth daily., Disp: 90 tablet, Rfl: 1 .  aspirin 81 MG chewable tablet, Chew 1 tablet (81 mg total) by mouth 2 (two) times daily., Disp: 30 tablet, Rfl: 0 .  atorvastatin (LIPITOR) 40 MG tablet, TAKE 1 Tablet BY MOUTH ONCE DAILY AT 6 PM (Patient taking differently: Take 40 mg by mouth daily at 6 PM. ), Disp: 90 tablet, Rfl: 1 .  hydrocortisone 1 % ointment, Apply 1 application topically 2 (two) times daily., Disp: 454 g, Rfl: 0 .  lisinopril (ZESTRIL) 20 MG tablet, Take 1 tablet (20 mg total) by mouth daily., Disp: 90 tablet, Rfl: 1 .  omeprazole (PRILOSEC) 20  MG capsule, TAKE 1 Capsule  BY MOUTH TWICE DAILY BEFORE MEALS (Patient taking differently: Take 20 mg by mouth 2 (two) times daily before a meal. ), Disp: 90 capsule, Rfl: 1 .  citalopram (CELEXA) 20 MG tablet, Take 1 tablet (20 mg total) by mouth daily. (Patient not taking: Reported on 05/19/2020), Disp: 30 tablet, Rfl: 3 .  HYDROcodone-acetaminophen (NORCO/VICODIN) 5-325 MG tablet, Take 1 tablet by mouth every 6 (six) hours as needed for moderate pain. (Patient not taking: Reported on 05/19/2020), Disp: 30 tablet, Rfl: 0 .  methocarbamol (ROBAXIN) 500 MG tablet, Take 1 tablet (500 mg total) by mouth every 6 (six) hours as needed for muscle spasms. (Patient not taking: Reported on 05/19/2020), Disp: 40 tablet, Rfl: 1 .  tamsulosin (FLOMAX) 0.4 MG CAPS capsule, Take 1 capsule (0.4 mg total) by mouth at bedtime. (Patient not taking: Reported on 05/19/2020), Disp: 30 capsule, Rfl: 0    Review of Systems  Per HPI unless specifically indicated above     Objective:    BP (!) 130/70   Pulse 101   Temp 97.7 F (36.5 C)   Ht 5' 8.5" (1.74 m)   Wt 176 lb 8 oz (80.1 kg)   SpO2 98%  BMI 26.45 kg/m   Wt Readings from Last 3 Encounters:  05/19/20 176 lb 8 oz (80.1 kg)  04/29/20 186 lb 9.6 oz (84.6 kg)  04/24/20 186 lb 9.6 oz (84.6 kg)    Physical Exam Vitals reviewed.  Constitutional:      General: He is not in acute distress.    Appearance: He is well-developed.     Comments: disheveled  HENT:     Head: Normocephalic and atraumatic.  Cardiovascular:     Rate and Rhythm: Normal rate and regular rhythm.  Pulmonary:     Effort: Pulmonary effort is normal.     Breath sounds: Normal breath sounds. No wheezing.  Abdominal:     General: Bowel sounds are normal.     Palpations: Abdomen is soft.     Tenderness: There is no abdominal tenderness.  Musculoskeletal:     Cervical back: Neck supple.     Right lower leg: No edema.     Left lower leg: No edema.  Lymphadenopathy:     Cervical: No  cervical adenopathy.  Skin:    General: Skin is warm and dry.  Neurological:     Mental Status: He is alert and oriented to person, place, and time.  Psychiatric:        Behavior: Behavior normal.     Results for orders placed or performed during the hospital encounter of 05/19/20  Comprehensive metabolic panel  Result Value Ref Range   Sodium 137 135 - 145 mmol/L   Potassium 3.3 (L) 3.5 - 5.1 mmol/L   Chloride 101 98 - 111 mmol/L   CO2 26 22 - 32 mmol/L   Glucose, Bld 113 (H) 70 - 99 mg/dL   BUN 15 6 - 20 mg/dL   Creatinine, Ser 2.35 0.61 - 1.24 mg/dL   Calcium 8.8 (L) 8.9 - 10.3 mg/dL   Total Protein 7.3 6.5 - 8.1 g/dL   Albumin 3.2 (L) 3.5 - 5.0 g/dL   AST 15 15 - 41 U/L   ALT 15 0 - 44 U/L   Alkaline Phosphatase 116 38 - 126 U/L   Total Bilirubin 0.3 0.3 - 1.2 mg/dL   GFR calc non Af Amer >60 >60 mL/min   GFR calc Af Amer >60 >60 mL/min   Anion gap 10 5 - 15  Lipid panel  Result Value Ref Range   Cholesterol 139 0 - 200 mg/dL   Triglycerides 573 <220 mg/dL   HDL 31 (L) >25 mg/dL   Total CHOL/HDL Ratio 4.5 RATIO   VLDL 23 0 - 40 mg/dL   LDL Cholesterol 85 0 - 99 mg/dL  Hemoglobin K2H  Result Value Ref Range   Hgb A1c MFr Bld 5.7 (H) 4.8 - 5.6 %   Mean Plasma Glucose 116.89 mg/dL      Assessment & Plan:    Encounter Diagnoses  Name Primary?  . Essential hypertension Yes  . Hyperlipidemia, unspecified hyperlipidemia type   . Tobacco use disorder   . Chronic obstructive pulmonary disease, unspecified COPD type (HCC)      -reviewed labs with pt  -encouraged smoking cessation -pt to contuinue current medications -pt to continue with orthopedics per their recommendation -pt to follow up 3 months.  He is to contact office sooner prn

## 2020-05-19 NOTE — Telephone Encounter (Signed)
Patient's wife Dois Davenport called advised the Hydrocodone is not working and patient asked if there is something a little stronger he can take for the pain? The number to contact Dois Davenport is [phone]

## 2020-05-19 NOTE — Telephone Encounter (Signed)
He can try taking Oxycodone again instead of the hydrocodone if that works better?  I can send in RX if that's okay with him

## 2020-05-19 NOTE — Telephone Encounter (Signed)
Can you advise for [name]? Left THA 04/29/20

## 2020-05-20 ENCOUNTER — Other Ambulatory Visit: Payer: Self-pay | Admitting: Orthopaedic Surgery

## 2020-05-20 ENCOUNTER — Ambulatory Visit: Payer: Self-pay | Admitting: Physical Therapy

## 2020-05-20 MED ORDER — OXYCODONE HCL 5 MG PO TABS: 5.0000 mg | ORAL_TABLET | Freq: Four times a day (QID) | ORAL | 0 refills | Status: DC | PRN

## 2020-05-20 NOTE — Telephone Encounter (Signed)
Can we send this in for him please?

## 2020-05-20 NOTE — Telephone Encounter (Signed)
I sent in some oxycodone. 

## 2020-05-20 NOTE — Telephone Encounter (Signed)
LMOM for patient of the below message and asked that he call back and let us know if he would like for [place] to call this in for him

## 2020-05-20 NOTE — Telephone Encounter (Signed)
Patients wife Merrily Pew  Patient confirmed the Oxycodone was okay instead of Hydrocodone.  Call back: [phone]

## 2020-05-29 ENCOUNTER — Telehealth: Payer: Self-pay | Admitting: Cardiovascular Disease

## 2020-05-29 NOTE — Telephone Encounter (Signed)
Called and spoke with pt's wife per DPR, discussed Dr.O'Neals review of his echo. Wife verbalized understanding with no other questions at this time.  Reminded wife of his follow up appt on 07/28/20 at 9am.

## 2020-05-29 NOTE — Telephone Encounter (Signed)
    Pt's wife returning call to get echo results  

## 2020-06-10 ENCOUNTER — Ambulatory Visit: Payer: Self-pay | Admitting: Orthopaedic Surgery

## 2020-06-18 ENCOUNTER — Encounter: Payer: Self-pay | Admitting: Orthopaedic Surgery

## 2020-06-18 ENCOUNTER — Ambulatory Visit (INDEPENDENT_AMBULATORY_CARE_PROVIDER_SITE_OTHER): Payer: Self-pay | Admitting: Orthopaedic Surgery

## 2020-06-18 DIAGNOSIS — Z96642 Presence of left artificial hip joint: Secondary | ICD-10-CM

## 2020-06-18 NOTE — Progress Notes (Signed)
The patient is now almost 7 weeks status post a left total hip arthroplasty.  He reports that he is doing well overall.  He does have some soreness with walking and when he first gets up but he states that it feels much better than what it did in this weeks leading up to surgery.  He is walking without assistive device.  When he first gets up he has a little limp but then he walks it off.  I can easily put his left operative hip through internal and external rotation with really no discomfort at all.  His leg lengths are equal.  This point we will allow him to return to work without restrictions starting September 13.  That will give him 2 more weeks to rehab his hip.  I will see him back in 6 months with a standing low AP pelvis and lateral of his left operative hip.  If there is any issues before then he knows to let us know.

## 2020-07-01 ENCOUNTER — Other Ambulatory Visit: Payer: Self-pay | Admitting: Physician Assistant

## 2020-07-27 NOTE — Progress Notes (Deleted)
Cardiology Office Note:   Date:  07/27/2020  NAME:  [name]    MRN: [id number] DOB:  [birth year]   PCP:  Jacquelin Hawking, PA-C  Cardiologist:  No primary care provider on file.  Electrophysiologist:  None   Referring MD: Jacquelin Hawking, PA-C   No chief complaint on file. ***  History of Present Illness:   [name] is a 59 y.o. male with a hx of CVA, COPD, HTN, tobacco abuse who presents for follow-up. He was seen 04/24/2020 for preop assessment prior to hip surgery. Had a systolic murmur and echo showed aortic sclerosis.   Problem List 1. R pontine infarct (CVA) 2. COPD 3. HTN 4. Tobacco abuse  5. T chol 139, HDL 31, LDL 85, TG 116  Past Medical History: Past Medical History:  Diagnosis Date  . Acid reflux   . Arthritis   . COPD (chronic obstructive pulmonary disease) (HCC)   . Depression   . Hypertension   . Kidney stone   . Stroke [facility])     Past Surgical History: Past Surgical History:  Procedure Laterality Date  . CHOLECYSTECTOMY    . TOTAL HIP ARTHROPLASTY Left 04/29/2020   Procedure: LEFT TOTAL HIP ARTHROPLASTY ANTERIOR APPROACH;  Surgeon: Kathryne Hitch, MD;  Location: [facility] OR;  Service: Orthopedics;  Laterality: Left;    Current Medications: No outpatient medications have been marked as taking for the 07/28/20 encounter (Appointment) with O'Neal, Ronnald Ramp, MD.     Allergies:    Morphine and related and Penicillins   Social History: Social History   Socioeconomic History  . Marital status: Married    Spouse name: Not on file  . Number of children: 4  . Years of education: Not on file  . Highest education level: Not on file  Occupational History  . Not on file  Tobacco Use  . Smoking status: Current Every Day Smoker    Packs/day: 1.00    Years: 40.00    Pack years: 40.00    Types: Cigarettes  . Smokeless tobacco: Never Used  . Tobacco comment: pack a day was two packs before stroke  Vaping Use  . Vaping Use: Never used   Substance and Sexual Activity  . Alcohol use: Not Currently    Alcohol/week: 0.0 standard drinks    Comment: none since 2004. previously heavy drinker  . Drug use: Not Currently    Types: Marijuana    Comment: last use 08/11/2018. Uses marijuana daily.  Marland Kitchen Sexual activity: Not on file  Other Topics Concern  . Not on file  Social History Narrative  . Not on file   Social Determinants of Health   Financial Resource Strain:   . Difficulty of Paying Living Expenses: Not on file  Food Insecurity:   . Worried About Programme researcher, broadcasting/film/video in the Last Year: Not on file  . Ran Out of Food in the Last Year: Not on file  Transportation Needs:   . Lack of Transportation (Medical): Not on file  . Lack of Transportation (Non-Medical): Not on file  Physical Activity:   . Days of Exercise per Week: Not on file  . Minutes of Exercise per Session: Not on file  Stress:   . Feeling of Stress : Not on file  Social Connections:   . Frequency of Communication with Friends and Family: Not on file  . Frequency of Social Gatherings with Friends and Family: Not on file  . Attends Religious Services: Not on file  .  Active Member of Clubs or Organizations: Not on file  . Attends Banker Meetings: Not on file  . Marital Status: Not on file     Family History: The patient's ***family history includes Diabetes in his father; Epilepsy in his mother; Heart disease in his father; Hyperlipidemia in his father; Hypertension in his father.  ROS:   All other ROS reviewed and negative. Pertinent positives noted in the HPI.     EKGs/Labs/Other Studies Reviewed:   The following studies were personally reviewed by me today:  EKG:  EKG is *** ordered today.  The ekg ordered today demonstrates ***, and was personally reviewed by me.   TTE 05/16/2020 1. Left ventricular ejection fraction, by estimation, is 60 to 65%. The  left ventricle has normal function. The left ventricle has no regional  wall  motion abnormalities. Left ventricular diastolic parameters were  normal.  2. Right ventricular systolic function is normal. The right ventricular  size is normal. Tricuspid regurgitation signal is inadequate for assessing  PA pressure.  3. The mitral valve is normal in structure. Trivial mitral valve  regurgitation. No evidence of mitral stenosis.  4. The aortic valve is tricuspid. Aortic valve regurgitation is not  visualized. Mild aortic valve sclerosis is present, with no evidence of  aortic valve stenosis.  5. The inferior vena cava is normal in size with greater than 50%  respiratory variability, suggesting right atrial pressure of 3 mmHg.   Recent Labs: 04/30/2020: Hemoglobin 9.3; Platelets 308 05/19/2020: ALT 15; BUN 15; Creatinine, Ser 0.78; Potassium 3.3; Sodium 137   Recent Lipid Panel    Component Value Date/Time   CHOL 139 05/19/2020 0949   CHOL 244 (H) 02/15/2014 1630   TRIG 116 05/19/2020 0949   HDL 31 (L) 05/19/2020 0949   HDL 30 (L) 02/15/2014 1630   CHOLHDL 4.5 05/19/2020 0949   VLDL 23 05/19/2020 0949   LDLCALC 85 05/19/2020 0949   LDLCALC 168 (H) 02/15/2014 1630    Physical Exam:   VS:  There were no vitals taken for this visit.   Wt Readings from Last 3 Encounters:  05/19/20 176 lb 8 oz (80.1 kg)  04/29/20 186 lb 9.6 oz (84.6 kg)  04/24/20 186 lb 9.6 oz (84.6 kg)    General: Well nourished, well developed, in no acute distress Heart: Atraumatic, normal size  Eyes: PEERLA, EOMI  Neck: Supple, no JVD Endocrine: No thryomegaly Cardiac: Normal S1, S2; RRR; no murmurs, rubs, or gallops Lungs: Clear to auscultation bilaterally, no wheezing, rhonchi or rales  Abd: Soft, nontender, no hepatomegaly  Ext: No edema, pulses 2+ Musculoskeletal: No deformities, BUE and BLE strength normal and equal Skin: Warm and dry, no rashes   Neuro: Alert and oriented to person, place, time, and situation, CNII-XII grossly intact, no focal deficits  Psych: Normal mood and  affect   ASSESSMENT:   [name] is a 59 y.o. male who presents for the following: No diagnosis found.  PLAN:   There are no diagnoses linked to this encounter.  Disposition: No follow-ups on file.  Medication Adjustments/Labs and Tests Ordered: Current medicines are reviewed at length with the patient today.  Concerns regarding medicines are outlined above.  No orders of the defined types were placed in this encounter.  No orders of the defined types were placed in this encounter.   There are no Patient Instructions on file for this visit.   Time Spent with Patient: I have spent a total of ***  minutes with patient reviewing hospital notes, telemetry, EKGs, labs and examining the patient as well as establishing an assessment and plan that was discussed with the patient.  > 50% of time was spent in direct patient care.  Signed, Lenna Gilford. Flora Lipps, MD [facility]  [address] [place], [place] [address] [phone]  07/27/2020 4:52 PM

## 2020-07-28 ENCOUNTER — Ambulatory Visit: Payer: Self-pay | Admitting: Cardiovascular Disease

## 2020-08-19 ENCOUNTER — Encounter: Payer: Self-pay | Admitting: Physician Assistant

## 2020-08-19 ENCOUNTER — Ambulatory Visit: Payer: Self-pay | Admitting: Physician Assistant

## 2020-08-19 ENCOUNTER — Other Ambulatory Visit: Payer: Self-pay

## 2020-08-19 ENCOUNTER — Other Ambulatory Visit (HOSPITAL_COMMUNITY)
Admission: RE | Admit: 2020-08-19 | Discharge: 2020-08-19 | Disposition: A | Discharge status: Home / Self Care | Payer: Self-pay | Source: Ambulatory Visit | Attending: Physician Assistant | Admitting: Physician Assistant

## 2020-08-19 VITALS — BP 130/72 | HR 92 | Temp 97.9°F | Ht 68.5 in | Wt 198.6 lb

## 2020-08-19 DIAGNOSIS — I10 Essential (primary) hypertension: Secondary | ICD-10-CM | POA: Insufficient documentation

## 2020-08-19 DIAGNOSIS — F17200 Nicotine dependence, unspecified, uncomplicated: Secondary | ICD-10-CM

## 2020-08-19 DIAGNOSIS — J449 Chronic obstructive pulmonary disease, unspecified: Secondary | ICD-10-CM

## 2020-08-19 DIAGNOSIS — Z125 Encounter for screening for malignant neoplasm of prostate: Secondary | ICD-10-CM | POA: Insufficient documentation

## 2020-08-19 DIAGNOSIS — F32A Depression, unspecified: Secondary | ICD-10-CM

## 2020-08-19 DIAGNOSIS — E785 Hyperlipidemia, unspecified: Secondary | ICD-10-CM

## 2020-08-19 DIAGNOSIS — Z1211 Encounter for screening for malignant neoplasm of colon: Secondary | ICD-10-CM

## 2020-08-19 LAB — COMPREHENSIVE METABOLIC PANEL
ALT: 17 U/L (ref 0–44)
AST: 17 U/L (ref 15–41)
Albumin: 3.8 g/dL (ref 3.5–5.0)
Alkaline Phosphatase: 72 U/L (ref 38–126)
Anion gap: 6 (ref 5–15)
BUN: 27 mg/dL — ABNORMAL HIGH (ref 6–20)
CO2: 25 mmol/L (ref 22–32)
Calcium: 8.9 mg/dL (ref 8.9–10.3)
Chloride: 104 mmol/L (ref 98–111)
Creatinine, Ser: 0.89 mg/dL (ref 0.61–1.24)
GFR, Estimated: >60 mL/min (ref >60)
Glucose, Bld: 111 mg/dL — ABNORMAL HIGH (ref 70–99)
Potassium: 4.5 mmol/L (ref 3.5–5.1)
Sodium: 135 mmol/L (ref 135–145)
Total Bilirubin: 0.5 mg/dL (ref 0.3–1.2)
Total Protein: 7.5 g/dL (ref 6.5–8.1)

## 2020-08-19 LAB — PSA: Prostatic Specific Antigen: 0.86 ng/mL (ref 0.00–4.00)

## 2020-08-19 LAB — LIPID PANEL
Cholesterol: 182 mg/dL (ref 0–200)
HDL: 35 mg/dL — ABNORMAL LOW (ref >40)
LDL Cholesterol: 112 mg/dL — ABNORMAL HIGH (ref 0–99)
Total CHOL/HDL Ratio: 5.2 RATIO
Triglycerides: 173 mg/dL — ABNORMAL HIGH (ref <150)
VLDL: 35 mg/dL (ref 0–40)

## 2020-08-19 NOTE — Progress Notes (Signed)
BP 130/72   Pulse 92   Temp 97.9 F (36.6 C)   Ht 5' 8.5" (1.74 m)   Wt 198 lb 9.6 oz (90.1 kg)   SpO2 97%   BMI 29.76 kg/m    Subjective:    Patient ID: [name], male    DOB: 1960-11-09, 59 y.o.   MRN: [id number]  HPI: [name] is a 59 y.o. male presenting on 08/19/2020 for Hypertension, Hyperlipidemia, and COPD   HPI   Pt had a negative covid 19 screening questionnaire.   Pt is 59yoM with HTN, dyslipidemia, GERD and depression.  He Recently had hip replacement.  He says he is Doing okay.  He continues to smoke.  He says he Only uses inhaler maybe once/month.  He says his mood is pretty good.  He has no complaints today.     Relevant past medical, surgical, family and social history reviewed and updated as indicated. Interim medical history since our last visit reviewed. Allergies and medications reviewed and updated.   Current Outpatient Medications:  .  albuterol (PROVENTIL HFA) 108 (90 Base) MCG/ACT inhaler, INHALE 2 PUFFS BY MOUTH EVERY 6 HOURS AS NEEDED FOR COUGHING, WHEEZING, OR SHORTNESS OF BREATH, Disp: 20.1 g, Rfl: 1 .  amLODipine (NORVASC) 5 MG tablet, TAKE 1 Tablet BY MOUTH ONCE DAILY, Disp: 90 tablet, Rfl: 1 .  aspirin 81 MG chewable tablet, Chew 1 tablet (81 mg total) by mouth 2 (two) times daily., Disp: 30 tablet, Rfl: 0 .  atorvastatin (LIPITOR) 40 MG tablet, TAKE 1 Tablet BY MOUTH ONCE DAILY AT 6PM, Disp: 90 tablet, Rfl: 1 .  citalopram (CELEXA) 20 MG tablet, Take 1 tablet (20 mg total) by mouth daily., Disp: 30 tablet, Rfl: 3 .  hydrocortisone 1 % ointment, Apply 1 application topically 2 (two) times daily., Disp: 454 g, Rfl: 0 .  lisinopril (ZESTRIL) 20 MG tablet, TAKE 1 Tablet BY MOUTH ONCE DAILY, Disp: 90 tablet, Rfl: 1 .  omeprazole (PRILOSEC) 20 MG capsule, TAKE 1 Capsule  BY MOUTH TWICE DAILY BEFORE MEALS, Disp: 90 capsule, Rfl: 1 .  tamsulosin (FLOMAX) 0.4 MG CAPS capsule, Take 1 capsule (0.4 mg total) by mouth at bedtime. (Patient not  taking: Reported on 08/19/2020), Disp: 30 capsule, Rfl: 0   Review of Systems  Per HPI unless specifically indicated above     Objective:    BP 130/72   Pulse 92   Temp 97.9 F (36.6 C)   Ht 5' 8.5" (1.74 m)   Wt 198 lb 9.6 oz (90.1 kg)   SpO2 97%   BMI 29.76 kg/m   Wt Readings from Last 3 Encounters:  08/19/20 198 lb 9.6 oz (90.1 kg)  05/19/20 176 lb 8 oz (80.1 kg)  04/29/20 186 lb 9.6 oz (84.6 kg)    Physical Exam Vitals reviewed.  Constitutional:      General: He is not in acute distress.    Appearance: He is well-developed. He is not ill-appearing.  HENT:     Head: Normocephalic and atraumatic.  Cardiovascular:     Rate and Rhythm: Normal rate and regular rhythm.  Pulmonary:     Effort: Pulmonary effort is normal.     Breath sounds: Normal breath sounds. No wheezing.  Abdominal:     General: Bowel sounds are normal.     Palpations: Abdomen is soft.     Tenderness: There is no abdominal tenderness.  Musculoskeletal:     Cervical back: Neck supple.  Right lower leg: No edema.     Left lower leg: No edema.  Lymphadenopathy:     Cervical: No cervical adenopathy.  Skin:    General: Skin is warm and dry.  Neurological:     Mental Status: He is alert and oriented to person, place, and time.  Psychiatric:        Attention and Perception: Attention normal.        Mood and Affect: Mood normal. Affect is not inappropriate.        Speech: Speech normal.        Behavior: Behavior normal. Behavior is cooperative.     Results for orders placed or performed during the hospital encounter of 08/19/20  Lipid panel  Result Value Ref Range   Cholesterol 182 0 - 200 mg/dL   Triglycerides 387 (H) <150 mg/dL   HDL 35 (L) >56 mg/dL   Total CHOL/HDL Ratio 5.2 RATIO   VLDL 35 0 - 40 mg/dL   LDL Cholesterol 433 (H) 0 - 99 mg/dL  Comprehensive metabolic panel  Result Value Ref Range   Sodium 135 135 - 145 mmol/L   Potassium 4.5 3.5 - 5.1 mmol/L   Chloride 104 98 - 111  mmol/L   CO2 25 22 - 32 mmol/L   Glucose, Bld 111 (H) 70 - 99 mg/dL   BUN 27 (H) 6 - 20 mg/dL   Creatinine, Ser 2.95 0.61 - 1.24 mg/dL   Calcium 8.9 8.9 - 18.8 mg/dL   Total Protein 7.5 6.5 - 8.1 g/dL   Albumin 3.8 3.5 - 5.0 g/dL   AST 17 15 - 41 U/L   ALT 17 0 - 44 U/L   Alkaline Phosphatase 72 38 - 126 U/L   Total Bilirubin 0.5 0.3 - 1.2 mg/dL   GFR, Estimated >41 >66 mL/min   Anion gap 6 5 - 15      Assessment & Plan:    Encounter Diagnoses  Name Primary?  . Essential hypertension Yes  . Hyperlipidemia, unspecified hyperlipidemia type   . Tobacco use disorder   . Chronic obstructive pulmonary disease, unspecified COPD type (HCC)   . Depression, unspecified depression type   . Screening for colon cancer      -reviewed labs with pt -pt is given ifobt for colon cancer screening -pt is to continue current medications -encouraged smoking cessation -pt to follow up 3 months.  He is to contact office sooner prn

## 2020-08-26 ENCOUNTER — Other Ambulatory Visit: Payer: Self-pay | Admitting: Physician Assistant

## 2020-10-14 ENCOUNTER — Other Ambulatory Visit: Payer: Self-pay | Admitting: Physician Assistant

## 2020-11-12 ENCOUNTER — Other Ambulatory Visit: Payer: Self-pay | Admitting: Physician Assistant

## 2020-11-12 DIAGNOSIS — E785 Hyperlipidemia, unspecified: Secondary | ICD-10-CM

## 2020-11-12 DIAGNOSIS — I10 Essential (primary) hypertension: Secondary | ICD-10-CM

## 2020-11-24 ENCOUNTER — Ambulatory Visit: Payer: Self-pay | Admitting: Physician Assistant

## 2020-12-22 ENCOUNTER — Ambulatory Visit: Payer: Self-pay | Admitting: Orthopaedic Surgery

## 2020-12-24 ENCOUNTER — Ambulatory Visit: Payer: Self-pay | Admitting: Physician Assistant

## 2020-12-29 ENCOUNTER — Ambulatory Visit: Payer: Self-pay | Admitting: Orthopaedic Surgery

## 2021-01-06 ENCOUNTER — Ambulatory Visit: Payer: Self-pay | Admitting: Physician Assistant

## 2021-01-13 ENCOUNTER — Other Ambulatory Visit: Payer: Self-pay | Admitting: Physician Assistant

## 2021-01-27 ENCOUNTER — Other Ambulatory Visit: Payer: Self-pay

## 2021-01-27 ENCOUNTER — Ambulatory Visit: Payer: Self-pay | Admitting: Physician Assistant

## 2021-01-27 ENCOUNTER — Encounter: Payer: Self-pay | Admitting: Physician Assistant

## 2021-01-27 ENCOUNTER — Other Ambulatory Visit (HOSPITAL_COMMUNITY)
Admission: RE | Admit: 2021-01-27 | Discharge: 2021-01-27 | Disposition: A | Discharge status: Home / Self Care | Payer: Self-pay | Source: Ambulatory Visit | Attending: Physician Assistant | Admitting: Physician Assistant

## 2021-01-27 VITALS — BP 160/90 | HR 83 | Temp 98.3°F | Wt 215.0 lb

## 2021-01-27 DIAGNOSIS — E785 Hyperlipidemia, unspecified: Secondary | ICD-10-CM

## 2021-01-27 DIAGNOSIS — J449 Chronic obstructive pulmonary disease, unspecified: Secondary | ICD-10-CM

## 2021-01-27 DIAGNOSIS — I10 Essential (primary) hypertension: Secondary | ICD-10-CM | POA: Insufficient documentation

## 2021-01-27 DIAGNOSIS — F17200 Nicotine dependence, unspecified, uncomplicated: Secondary | ICD-10-CM

## 2021-01-27 DIAGNOSIS — F418 Other specified anxiety disorders: Secondary | ICD-10-CM

## 2021-01-27 LAB — COMPREHENSIVE METABOLIC PANEL
ALT: 17 U/L (ref 0–44)
AST: 22 U/L (ref 15–41)
Albumin: 3.8 g/dL (ref 3.5–5.0)
Alkaline Phosphatase: 84 U/L (ref 38–126)
Anion gap: 9 (ref 5–15)
BUN: 19 mg/dL (ref 6–20)
CO2: 22 mmol/L (ref 22–32)
Calcium: 8.4 mg/dL — ABNORMAL LOW (ref 8.9–10.3)
Chloride: 106 mmol/L (ref 98–111)
Creatinine, Ser: 1.1 mg/dL (ref 0.61–1.24)
GFR, Estimated: >60 mL/min (ref >60)
Glucose, Bld: 130 mg/dL — ABNORMAL HIGH (ref 70–99)
Potassium: 3.8 mmol/L (ref 3.5–5.1)
Sodium: 137 mmol/L (ref 135–145)
Total Bilirubin: 0.5 mg/dL (ref 0.3–1.2)
Total Protein: 7 g/dL (ref 6.5–8.1)

## 2021-01-27 LAB — LIPID PANEL
Cholesterol: 151 mg/dL (ref 0–200)
HDL: 28 mg/dL — ABNORMAL LOW (ref >40)
LDL Cholesterol: 76 mg/dL (ref 0–99)
Total CHOL/HDL Ratio: 5.4 RATIO
Triglycerides: 237 mg/dL — ABNORMAL HIGH (ref <150)
VLDL: 47 mg/dL — ABNORMAL HIGH (ref 0–40)

## 2021-01-27 MED ORDER — AMLODIPINE BESYLATE 5 MG PO TABS: 1.0000 | ORAL_TABLET | Freq: Every day | ORAL | 1 refills | Status: DC

## 2021-01-27 MED ORDER — LISINOPRIL 20 MG PO TABS: 1.0000 | ORAL_TABLET | Freq: Every day | ORAL | 1 refills | Status: DC

## 2021-01-27 MED ORDER — CITALOPRAM HYDROBROMIDE 20 MG PO TABS: 20.0000 mg | ORAL_TABLET | Freq: Every day | ORAL | 1 refills | Status: DC
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
## 2021-02-10 ENCOUNTER — Other Ambulatory Visit: Payer: Self-pay | Admitting: Physician Assistant

## 2021-02-10 DIAGNOSIS — I10 Essential (primary) hypertension: Secondary | ICD-10-CM

## 2021-02-10 DIAGNOSIS — E785 Hyperlipidemia, unspecified: Secondary | ICD-10-CM

## 2021-02-16 DIAGNOSIS — Z0289 Encounter for other administrative examinations: Secondary | ICD-10-CM

## 2021-02-25 ENCOUNTER — Other Ambulatory Visit: Payer: Self-pay | Admitting: Physician Assistant

## 2021-02-25 ENCOUNTER — Ambulatory Visit: Payer: Self-pay | Admitting: Physician Assistant

## 2021-02-25 MED ORDER — ALBUTEROL SULFATE HFA 108 (90 BASE) MCG/ACT IN AERS: INHALATION_SPRAY | RESPIRATORY_TRACT | 0 refills | Status: DC

## 2021-02-25 MED ORDER — AMLODIPINE BESYLATE 5 MG PO TABS: 1.0000 | ORAL_TABLET | Freq: Every day | ORAL | 0 refills | Status: DC

## 2021-02-25 MED ORDER — CITALOPRAM HYDROBROMIDE 20 MG PO TABS: 20.0000 mg | ORAL_TABLET | Freq: Every day | ORAL | 0 refills | Status: DC

## 2021-02-25 MED ORDER — OMEPRAZOLE 20 MG PO CPDR: 20.0000 mg | DELAYED_RELEASE_CAPSULE | Freq: Two times a day (BID) | ORAL | 0 refills | Status: DC

## 2021-02-25 MED ORDER — ATORVASTATIN CALCIUM 40 MG PO TABS: ORAL_TABLET | ORAL | 0 refills | Status: DC

## 2021-02-25 MED ORDER — LISINOPRIL 20 MG PO TABS: 1.0000 | ORAL_TABLET | Freq: Every day | ORAL | 0 refills | Status: DC

## 2021-03-10 ENCOUNTER — Other Ambulatory Visit: Payer: Self-pay

## 2021-03-10 ENCOUNTER — Encounter: Payer: Self-pay | Admitting: Physician Assistant

## 2021-03-10 ENCOUNTER — Ambulatory Visit: Payer: Self-pay | Admitting: Physician Assistant

## 2021-03-10 ENCOUNTER — Other Ambulatory Visit (HOSPITAL_COMMUNITY)
Admission: RE | Admit: 2021-03-10 | Discharge: 2021-03-10 | Disposition: A | Discharge status: Home / Self Care | Payer: Self-pay | Source: Ambulatory Visit | Attending: Physician Assistant | Admitting: Physician Assistant

## 2021-03-10 VITALS — BP 185/105 | HR 84 | Temp 98.4°F | Wt 213.0 lb

## 2021-03-10 DIAGNOSIS — F439 Reaction to severe stress, unspecified: Secondary | ICD-10-CM

## 2021-03-10 DIAGNOSIS — F418 Other specified anxiety disorders: Secondary | ICD-10-CM

## 2021-03-10 DIAGNOSIS — I10 Essential (primary) hypertension: Secondary | ICD-10-CM

## 2021-03-10 DIAGNOSIS — R69 Illness, unspecified: Secondary | ICD-10-CM | POA: Insufficient documentation

## 2021-03-10 MED ORDER — AMLODIPINE BESYLATE 10 MG PO TABS: 10.0000 mg | ORAL_TABLET | Freq: Every day | ORAL | 0 refills | Status: DC
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
## 2021-03-10 NOTE — Patient Instructions (Signed)
Managing Stress, Adult Feeling a certain amount of stress is normal. Stress helps our body and mind get ready to deal with the demands of life. Stress hormones can motivate you to do well at work and meet your responsibilities. However severe or long-lasting (chronic) stress can affect your mental and physical health. Chronic stress puts you at higher risk for anxiety, depression, and other health problems like digestive problems, muscle aches, heart disease, high blood pressure, and stroke. What are the causes? Common causes of stress include:  Demands from work, such as deadlines, feeling overworked, or having long hours.  Pressures at home, such as money issues, disagreements with a spouse, or parenting issues.  Pressures from major life changes, such as divorce, moving, loss of a loved one, or chronic illness. You may be at higher risk for stress-related problems if you do not get enough sleep, are in poor health, do not have emotional support, or have a mental health disorder like anxiety or depression. How to recognize stress Stress can make you:  Have trouble sleeping.  Feel sad, anxious, irritable, or overwhelmed.  Lose your appetite.  Overeat or want to eat unhealthy foods.  Want to use drugs or alcohol. Stress can also cause physical symptoms, such as:  Sore, tense muscles, especially in the shoulders and neck.  Headaches.  Trouble breathing.  A faster heart rate.  Stomach pain, nausea, or vomiting.  Diarrhea or constipation.  Trouble concentrating. Follow these instructions at home: Lifestyle  Identify the source of your stress and your reaction to it. See a therapist who can help you change your reactions.  When there are stressful events: ? Talk about it with family, friends, or co-workers. ? Try to think realistically about stressful events and not ignore them or overreact. ? Try to find the positives in a stressful situation and not focus on the  negatives. ? Cut back on responsibilities at work and home, if possible. Ask for help from friends or family members if you need it.  Find ways to cope with stress, such as: ? Meditation. ? Deep breathing. ? Yoga or tai chi. ? Progressive muscle relaxation. ? Doing art, playing music, or reading. ? Making time for fun activities. ? Spending time with family and friends.  Get support from family, friends, or spiritual resources. Eating and drinking  Eat a healthy diet. This includes: ? Eating foods that are high in fiber, such as beans, whole grains, and fresh fruits and vegetables. ? Limiting foods that are high in fat and processed sugars, such as fried and sweet foods.  Do not skip meals or overeat.  Drink enough fluid to keep your urine pale yellow. Alcohol use  Do not drink alcohol if: ? Your health care provider tells you not to drink. ? You are pregnant, may be pregnant, or are planning to become pregnant.  Drinking alcohol is a way some people try to ease their stress. This can be dangerous, so if you drink alcohol: ? Limit how much you use to:  0-1 drink a day for women.  0-2 drinks a day for men. ? Be aware of how much alcohol is in your drink. In the [place] one drink equals one 12 oz bottle of beer (355 mL), one 5 oz glass of wine (148 mL), or one 1 oz glass of hard liquor (44 mL). Activity  Include 30 minutes of exercise in your daily schedule. Exercise is a good stress reducer.  Include time in your day for   an activity that you find relaxing. Try taking a walk, going on a bike ride, reading a book, or listening to music.  Schedule your time in a way that lowers stress, and keep a consistent schedule. Prioritize what is most important to get done.   General instructions  Get enough sleep. Try to go to sleep and get up at about the same time every day.  Take over-the-counter and prescription medicines only as told by your health care provider.  Do not use any  products that contain nicotine or tobacco, such as cigarettes, e-cigarettes, and chewing tobacco. If you need help quitting, ask your health care provider.  Do not use drugs or smoke to cope with stress.  Keep all follow-up visits as told by your health care provider. This is important. Where to find support  Talk with your health care provider about stress management or finding a support group.  Find a therapist to work with you on your stress management techniques. Contact a health care provider if:  Your stress symptoms get worse.  You are unable to manage your stress at home.  You are struggling to stop using drugs or alcohol. Get help right away if:  You may be a danger to yourself or others.  You have any thoughts of death or suicide. If you ever feel like you may hurt yourself or others, or have thoughts about taking your own life, get help right away. You can go to your nearest emergency department or call:  Your local emergency services (911 in the [place]  A suicide crisis helpline, such as the National Suicide Prevention Lifeline at 1-800-273-8255. This is open 24 hours a day. Summary  Feeling a certain amount of stress is normal, but severe or long-lasting (chronic) stress can affect your mental and physical health.  Chronic stress can put you at higher risk for anxiety, depression, and other health problems like digestive problems, muscle aches, heart disease, high blood pressure, and stroke.  You may be at higher risk for stress-related problems if you do not get enough sleep, are in poor health, lack emotional support, or have a mental health disorder like anxiety or depression.  Identify the source of your stress and your reaction to it. Try talking about stressful events with family, friends, or co-workers, finding a coping method, or getting support from spiritual resources.  If you need more help, talk with your health care provider about finding a support group  or a mental health therapist. This information is not intended to replace advice given to you by your health care provider. Make sure you discuss any questions you have with your health care provider. Document Revised: 05/09/2019 Document Reviewed: 05/09/2019 Elsevier Patient Education  2021 [employer]  

## 2021-04-07 ENCOUNTER — Ambulatory Visit: Payer: Self-pay | Admitting: Physician Assistant

## 2021-04-09 ENCOUNTER — Encounter: Payer: Self-pay | Admitting: Physician Assistant

## 2021-04-14 ENCOUNTER — Other Ambulatory Visit: Payer: Self-pay

## 2021-04-14 ENCOUNTER — Encounter: Payer: Self-pay | Admitting: Physician Assistant

## 2021-04-14 ENCOUNTER — Ambulatory Visit: Payer: Self-pay | Admitting: Physician Assistant

## 2021-04-14 VITALS — BP 181/95 | HR 85 | Temp 98.3°F

## 2021-04-14 DIAGNOSIS — J449 Chronic obstructive pulmonary disease, unspecified: Secondary | ICD-10-CM

## 2021-04-14 DIAGNOSIS — F17200 Nicotine dependence, unspecified, uncomplicated: Secondary | ICD-10-CM

## 2021-04-14 DIAGNOSIS — I10 Essential (primary) hypertension: Secondary | ICD-10-CM

## 2021-04-14 MED ORDER — ALBUTEROL SULFATE HFA 108 (90 BASE) MCG/ACT IN AERS: INHALATION_SPRAY | RESPIRATORY_TRACT | 0 refills | Status: DC

## 2021-04-14 MED ORDER — AMLODIPINE BESYLATE 10 MG PO TABS: 10.0000 mg | ORAL_TABLET | Freq: Every day | ORAL | 0 refills | Status: DC

## 2021-04-14 MED ORDER — LISINOPRIL 40 MG PO TABS: 40.0000 mg | ORAL_TABLET | Freq: Every day | ORAL | 1 refills | Status: DC

## 2021-04-14 MED ORDER — ATORVASTATIN CALCIUM 40 MG PO TABS: ORAL_TABLET | ORAL | 0 refills | Status: DC

## 2021-04-14 MED ORDER — CITALOPRAM HYDROBROMIDE 20 MG PO TABS: 20.0000 mg | ORAL_TABLET | Freq: Every day | ORAL | 0 refills | Status: DC

## 2021-04-14 MED ORDER — DULERA 50-5 MCG/ACT IN AERO: 2.0000 | INHALATION_SPRAY | Freq: Two times a day (BID) | RESPIRATORY_TRACT | 0 refills | Status: DC

## 2021-04-14 MED ORDER — OMEPRAZOLE 20 MG PO CPDR: 20.0000 mg | DELAYED_RELEASE_CAPSULE | Freq: Two times a day (BID) | ORAL | 0 refills | Status: DC

## 2021-04-14 NOTE — Progress Notes (Signed)
BP (!) 181/95   Pulse 85   Temp 98.3 F (36.8 C)   SpO2 97%    Subjective:    Patient ID: [name], male    DOB: 1960-12-29, 60 y.o.   MRN: [id number]  HPI: [name] is a 60 y.o. male presenting on 04/14/2021 for Hypertension   HPI    Pt had a negative covid 19 screening questionnaire.    Chief Complaint  Patient presents with   Hypertension     Pt is taking amlodipine 5mg  bid instead of 10mg  qd.  He says it made him feel bad when he took 10mg  qd. He was taking 2 of the 5mg  tabs.  Pt denies HA, vision changes, CP.      Relevant past medical, surgical, family and social history reviewed and updated as indicated. Interim medical history since our last visit reviewed. Allergies and medications reviewed and updated.    Current Outpatient Medications:    albuterol (PROVENTIL HFA) 108 (90 Base) MCG/ACT inhaler, INHALE 2 PUFFS BY MOUTH EVERY 6 HOURS AS NEEDED FOR COUGHING, WHEEZING, OR SHORTNESS OF BREATH, Disp: 3 each, Rfl: 0   amLODipine (NORVASC) 5 MG tablet, Take 5 mg by mouth 2 (two) times daily., Disp: , Rfl:    aspirin 81 MG chewable tablet, Chew 1 tablet (81 mg total) by mouth 2 (two) times daily., Disp: 30 tablet, Rfl: 0   atorvastatin (LIPITOR) 40 MG tablet, TAKE 1 Tablet BY MOUTH ONCE DAILY AT 6PM, Disp: 90 tablet, Rfl: 0   citalopram (CELEXA) 20 MG tablet, Take 1 tablet (20 mg total) by mouth daily., Disp: 90 tablet, Rfl: 0   lisinopril (ZESTRIL) 20 MG tablet, Take 1 tablet (20 mg total) by mouth daily., Disp: 90 tablet, Rfl: 0   omeprazole (PRILOSEC) 20 MG capsule, Take 1 capsule (20 mg total) by mouth 2 (two) times daily before a meal., Disp: 180 capsule, Rfl: 0   amLODipine (NORVASC) 10 MG tablet, Take 1 tablet (10 mg total) by mouth daily. (Patient not taking: Reported on 04/14/2021), Disp: 90 tablet, Rfl: 0   hydrocortisone 1 % ointment, Apply 1 application topically 2 (two) times daily., Disp: 454 g, Rfl: 0   tamsulosin (FLOMAX) 0.4 MG CAPS capsule,  Take 1 capsule (0.4 mg total) by mouth at bedtime. (Patient not taking: No sig reported), Disp: 30 capsule, Rfl: 0   Review of Systems  Per HPI unless specifically indicated above     Objective:    BP (!) 181/95   Pulse 85   Temp 98.3 F (36.8 C)   SpO2 97%   Wt Readings from Last 3 Encounters:  03/10/21 213 lb (96.6 kg)  01/27/21 215 lb (97.5 kg)  08/19/20 198 lb 9.6 oz (90.1 kg)    Physical Exam Vitals reviewed.  Constitutional:      General: He is not in acute distress.    Appearance: He is well-developed. He is not ill-appearing.  HENT:     Head: Normocephalic and atraumatic.  Cardiovascular:     Rate and Rhythm: Normal rate and regular rhythm.  Pulmonary:     Effort: Pulmonary effort is normal. No respiratory distress.     Breath sounds: Wheezing present. No rhonchi.  Abdominal:     General: Bowel sounds are normal.     Palpations: Abdomen is soft.     Tenderness: There is no abdominal tenderness.  Musculoskeletal:     Cervical back: Neck supple.     Right lower leg: No  edema.     Left lower leg: No edema.  Lymphadenopathy:     Cervical: No cervical adenopathy.  Skin:    General: Skin is warm and dry.  Neurological:     Mental Status: He is alert and oriented to person, place, and time.  Psychiatric:        Behavior: Behavior normal.           Assessment & Plan:     Encounter Diagnoses  Name Primary?   Essential hypertension Yes   Chronic obstructive pulmonary disease, unspecified COPD type (HCC)    Tobacco use disorder      -Add advair.  Pt was given application for patient assistance for the advair.  Gave sample Dulera until he gets his shipment.  -Pt is urged to stop smoking -Increase lisinopril to 40mg . -continue 10mg  amldpine -pt Will follow up in 2 weeks since he says his boss won't leet him RTW until his bp is down.  He is to notify office sooner prn

## 2021-04-15 ENCOUNTER — Other Ambulatory Visit: Payer: Self-pay | Admitting: Physician Assistant

## 2021-04-15 MED ORDER — ADVAIR HFA 45-21 MCG/ACT IN AERO: 2.0000 | INHALATION_SPRAY | Freq: Two times a day (BID) | RESPIRATORY_TRACT | 3 refills | Status: DC

## 2021-04-30 ENCOUNTER — Encounter: Payer: Self-pay | Admitting: Physician Assistant

## 2021-04-30 ENCOUNTER — Other Ambulatory Visit: Payer: Self-pay

## 2021-04-30 ENCOUNTER — Ambulatory Visit: Payer: Self-pay | Admitting: Physician Assistant

## 2021-04-30 VITALS — BP 143/83 | HR 87 | Temp 98.2°F

## 2021-04-30 DIAGNOSIS — F17200 Nicotine dependence, unspecified, uncomplicated: Secondary | ICD-10-CM

## 2021-04-30 DIAGNOSIS — I10 Essential (primary) hypertension: Secondary | ICD-10-CM

## 2021-04-30 DIAGNOSIS — J449 Chronic obstructive pulmonary disease, unspecified: Secondary | ICD-10-CM

## 2021-04-30 NOTE — Progress Notes (Signed)
BP (!) 143/83   Pulse 87   Temp 98.2 F (36.8 C)   SpO2 96%    Subjective:    Patient ID: [name], male    DOB: Jul 21, 1961, 60 y.o.   MRN: [id number]  HPI: [name] is a 60 y.o. male presenting on 04/30/2021 for Hypertension and COPD   HPI  Pt had a negative covid 19 screening questionnaire.  Chief Complaint  Patient presents with   Hypertension   COPD    Pt says his Breathing is "allright".  He is Still smoking.   He is Looking forward to returning to work.  His employer took him out of work due to bp elevation.  He monitors his bp at home.    Relevant past medical, surgical, family and social history reviewed and updated as indicated. Interim medical history since our last visit reviewed. Allergies and medications reviewed and updated.   Current Outpatient Medications:    albuterol (PROVENTIL HFA) 108 (90 Base) MCG/ACT inhaler, INHALE 2 PUFFS BY MOUTH EVERY 6 HOURS AS NEEDED FOR COUGHING, WHEEZING, OR SHORTNESS OF BREATH, Disp: 3 each, Rfl: 0   amLODipine (NORVASC) 10 MG tablet, Take 1 tablet (10 mg total) by mouth daily., Disp: 30 tablet, Rfl: 0   aspirin 81 MG chewable tablet, Chew 1 tablet (81 mg total) by mouth 2 (two) times daily., Disp: 30 tablet, Rfl: 0   atorvastatin (LIPITOR) 40 MG tablet, TAKE 1 Tablet BY MOUTH ONCE DAILY AT 6PM, Disp: 90 tablet, Rfl: 0   citalopram (CELEXA) 20 MG tablet, Take 1 tablet (20 mg total) by mouth daily., Disp: 90 tablet, Rfl: 0   lisinopril (ZESTRIL) 40 MG tablet, Take 1 tablet (40 mg total) by mouth daily., Disp: 30 tablet, Rfl: 1   Mometasone Furo-Formoterol Fum (DULERA) 50-5 MCG/ACT AERO, Inhale 2 puffs into the lungs 2 (two) times daily., Disp: 1 each, Rfl: 0   omeprazole (PRILOSEC) 20 MG capsule, Take 1 capsule (20 mg total) by mouth 2 (two) times daily before a meal., Disp: 180 capsule, Rfl: 0   fluticasone-salmeterol (ADVAIR HFA) 45-21 MCG/ACT inhaler, Inhale 2 puffs into the lungs 2 (two) times daily. (Patient not  taking: Reported on 04/30/2021), Disp: 3 each, Rfl: 3   hydrocortisone 1 % ointment, Apply 1 application topically 2 (two) times daily. (Patient not taking: Reported on 04/30/2021), Disp: 454 g, Rfl: 0   tamsulosin (FLOMAX) 0.4 MG CAPS capsule, Take 1 capsule (0.4 mg total) by mouth at bedtime. (Patient not taking: No sig reported), Disp: 30 capsule, Rfl: 0     Review of Systems  Per HPI unless specifically indicated above     Objective:    BP (!) 143/83   Pulse 87   Temp 98.2 F (36.8 C)   SpO2 96%   Wt Readings from Last 3 Encounters:  03/10/21 213 lb (96.6 kg)  01/27/21 215 lb (97.5 kg)  08/19/20 198 lb 9.6 oz (90.1 kg)    Physical Exam Vitals reviewed.  Constitutional:      General: He is not in acute distress.    Appearance: He is well-developed. He is not ill-appearing.  HENT:     Head: Normocephalic and atraumatic.  Cardiovascular:     Rate and Rhythm: Normal rate and regular rhythm.  Pulmonary:     Effort: Pulmonary effort is normal.     Breath sounds: Normal breath sounds. No wheezing.  Abdominal:     General: Bowel sounds are normal.  Palpations: Abdomen is soft.     Tenderness: There is no abdominal tenderness.  Musculoskeletal:     Cervical back: Neck supple.     Right lower leg: No edema.     Left lower leg: No edema.  Lymphadenopathy:     Cervical: No cervical adenopathy.  Skin:    General: Skin is warm and dry.  Neurological:     Mental Status: He is alert and oriented to person, place, and time.  Psychiatric:        Behavior: Behavior normal.          Assessment & Plan:     Encounter Diagnoses  Name Primary?   Essential hypertension Yes   Chronic obstructive pulmonary disease, unspecified COPD type (HCC)    Tobacco use disorder      -pt to continue current medications -Pt was given RTW note now that his bp is improved.  He is urged to avoid running out of his meds -encouraged smoking cessation -pt to follow up 3 months.  He is to  contact office sooner prn

## 2021-07-21 ENCOUNTER — Other Ambulatory Visit: Payer: Self-pay | Admitting: Physician Assistant

## 2021-07-21 DIAGNOSIS — I10 Essential (primary) hypertension: Secondary | ICD-10-CM

## 2021-07-21 DIAGNOSIS — E785 Hyperlipidemia, unspecified: Secondary | ICD-10-CM

## 2021-07-21 DIAGNOSIS — Z125 Encounter for screening for malignant neoplasm of prostate: Secondary | ICD-10-CM

## 2021-07-29 ENCOUNTER — Other Ambulatory Visit: Payer: Self-pay | Admitting: Physician Assistant

## 2021-07-30 ENCOUNTER — Other Ambulatory Visit: Payer: Self-pay

## 2021-07-30 ENCOUNTER — Ambulatory Visit: Payer: Self-pay | Admitting: Physician Assistant

## 2021-07-30 VITALS — BP 151/90 | HR 74 | Temp 98.0°F | Wt 219.5 lb

## 2021-07-30 DIAGNOSIS — E785 Hyperlipidemia, unspecified: Secondary | ICD-10-CM

## 2021-07-30 DIAGNOSIS — F439 Reaction to severe stress, unspecified: Secondary | ICD-10-CM

## 2021-07-30 DIAGNOSIS — I10 Essential (primary) hypertension: Secondary | ICD-10-CM

## 2021-07-30 DIAGNOSIS — L309 Dermatitis, unspecified: Secondary | ICD-10-CM

## 2021-07-30 DIAGNOSIS — J449 Chronic obstructive pulmonary disease, unspecified: Secondary | ICD-10-CM

## 2021-07-30 DIAGNOSIS — F17200 Nicotine dependence, unspecified, uncomplicated: Secondary | ICD-10-CM

## 2021-07-30 MED ORDER — METOPROLOL TARTRATE 50 MG PO TABS: 50.0000 mg | ORAL_TABLET | Freq: Two times a day (BID) | ORAL | 3 refills | Status: DC

## 2021-07-30 NOTE — Progress Notes (Signed)
BP (!) 151/90   Pulse 74   Temp 98 F (36.7 C)   Wt 219 lb 8 oz (99.6 kg)   SpO2 98%   BMI 32.89 kg/m    Subjective:    Patient ID: [name], male    DOB: 02-04-1961, 60 y.o.   MRN: [id number]  HPI: [name] is a 60 y.o. male presenting on 07/30/2021 for Hypertension and Hyperlipidemia   HPI  Pt had a negative covid 19 screening questionnaire.  Chief Complaint  Patient presents with   Hypertension   Hyperlipidemia     Pt says his Mood has been "alright".  He says he is not feeling depression  Pt did not get his labs drawn.  He says his Breathing is okay  He Works doing landscaping/construction- Sport and exercise psychologist"  He says he Gets pains in legs, they "lock up" in calves.  He says it Only bothers him with walking.  He says his legs itch all the time.  He admits to scratching them until they bleed at times.  He is using the cortisone ointment given to him to help with this.   Pt never returned FIT test given to him 08/19/20   Relevant past medical, surgical, family and social history reviewed and updated as indicated. Interim medical history since our last visit reviewed. Allergies and medications reviewed and updated.   Current Outpatient Medications:    albuterol (PROVENTIL HFA) 108 (90 Base) MCG/ACT inhaler, INHALE 2 PUFFS BY MOUTH EVERY 6 HOURS AS NEEDED FOR COUGHING, WHEEZING, OR SHORTNESS OF BREATH, Disp: 3 each, Rfl: 0   amLODipine (NORVASC) 10 MG tablet, Take 1 tablet (10 mg total) by mouth daily., Disp: 30 tablet, Rfl: 0   aspirin 81 MG chewable tablet, Chew 1 tablet (81 mg total) by mouth 2 (two) times daily., Disp: 30 tablet, Rfl: 0   atorvastatin (LIPITOR) 40 MG tablet, TAKE 1 Tablet BY MOUTH ONCE DAILY AT 6PM, Disp: 90 tablet, Rfl: 0   citalopram (CELEXA) 20 MG tablet, Take 1 tablet (20 mg total) by mouth daily., Disp: 90 tablet, Rfl: 0   fluticasone-salmeterol (ADVAIR HFA) 45-21 MCG/ACT inhaler, Inhale 2 puffs into the lungs 2 (two) times  daily., Disp: 3 each, Rfl: 3   hydrocortisone 1 % ointment, Apply 1 application topically 2 (two) times daily., Disp: 454 g, Rfl: 0   lisinopril (ZESTRIL) 40 MG tablet, Take 1 tablet (40 mg total) by mouth daily., Disp: 30 tablet, Rfl: 1   Mometasone Furo-Formoterol Fum (DULERA) 50-5 MCG/ACT AERO, Inhale 2 puffs into the lungs 2 (two) times daily., Disp: 1 each, Rfl: 0   omeprazole (PRILOSEC) 20 MG capsule, Take 1 capsule (20 mg total) by mouth 2 (two) times daily before a meal., Disp: 180 capsule, Rfl: 0   tamsulosin (FLOMAX) 0.4 MG CAPS capsule, Take 1 capsule (0.4 mg total) by mouth at bedtime. (Patient not taking: No sig reported), Disp: 30 capsule, Rfl: 0    Review of Systems  Per HPI unless specifically indicated above     Objective:    BP (!) 151/90   Pulse 74   Temp 98 F (36.7 C)   Wt 219 lb 8 oz (99.6 kg)   SpO2 98%   BMI 32.89 kg/m   Wt Readings from Last 3 Encounters:  07/30/21 219 lb 8 oz (99.6 kg)  03/10/21 213 lb (96.6 kg)  01/27/21 215 lb (97.5 kg)    Physical Exam Vitals reviewed.  Constitutional:  General: He is not in acute distress.    Appearance: He is well-developed. He is not ill-appearing.  HENT:     Head: Normocephalic and atraumatic.  Cardiovascular:     Rate and Rhythm: Normal rate and regular rhythm.     Pulses:          Dorsalis pedis pulses are 2+ on the right side and 2+ on the left side.  Pulmonary:     Effort: Pulmonary effort is normal.     Breath sounds: Normal breath sounds. No wheezing.  Abdominal:     General: Bowel sounds are normal.     Palpations: Abdomen is soft.     Tenderness: There is no abdominal tenderness.  Musculoskeletal:     Cervical back: Neck supple.     Right lower leg: No edema.     Left lower leg: No edema.  Lymphadenopathy:     Cervical: No cervical adenopathy.  Skin:    General: Skin is warm and dry.  Neurological:     Mental Status: He is alert and oriented to person, place, and time.  Psychiatric:         Behavior: Behavior normal.             Assessment & Plan:     Encounter Diagnoses  Name Primary?   Essential hypertension Yes   Hyperlipidemia, unspecified hyperlipidemia type    Chronic obstructive pulmonary disease, unspecified COPD type (HCC)    Tobacco use disorder    Stress    Dermatitis         -pt to Get fasting labs drawn as ordered.  He will be called with results  -pt to continue Amlodipine 10 and Lisinopril 40.  Will Add metoprolol due to poorly controlled HTN  -refer to Dermatology for legs.  He has been using cortisone ointment but it doesn't seem to be helping him.  He is urged to stop scratching at the legs  -pt is given another FIT test for colon cancer screening -pt to continue celexa for stress issues/mood -encouraged Smoking cessation -encouraged Covid booster/flu shot -pt to follow up 3 months.  He is to contact office sooner prn

## 2021-08-03 ENCOUNTER — Encounter: Payer: Self-pay | Admitting: Physician Assistant

## 2021-08-03 ENCOUNTER — Ambulatory Visit: Payer: Self-pay | Admitting: Physician Assistant

## 2021-08-03 ENCOUNTER — Other Ambulatory Visit: Payer: Self-pay | Admitting: Physician Assistant

## 2021-08-03 ENCOUNTER — Other Ambulatory Visit (HOSPITAL_COMMUNITY)
Admission: RE | Admit: 2021-08-03 | Discharge: 2021-08-03 | Disposition: A | Discharge status: Home / Self Care | Payer: Self-pay | Source: Ambulatory Visit | Attending: Physician Assistant | Admitting: Physician Assistant

## 2021-08-03 DIAGNOSIS — Z1211 Encounter for screening for malignant neoplasm of colon: Secondary | ICD-10-CM

## 2021-08-03 DIAGNOSIS — I10 Essential (primary) hypertension: Secondary | ICD-10-CM | POA: Insufficient documentation

## 2021-08-03 DIAGNOSIS — E785 Hyperlipidemia, unspecified: Secondary | ICD-10-CM | POA: Insufficient documentation

## 2021-08-03 DIAGNOSIS — Z125 Encounter for screening for malignant neoplasm of prostate: Secondary | ICD-10-CM | POA: Insufficient documentation

## 2021-08-03 LAB — COMPREHENSIVE METABOLIC PANEL
ALT: 17 U/L (ref 0–44)
AST: 17 U/L (ref 15–41)
Albumin: 3.8 g/dL (ref 3.5–5.0)
Alkaline Phosphatase: 72 U/L (ref 38–126)
Anion gap: 6 (ref 5–15)
BUN: 17 mg/dL (ref 6–20)
CO2: 28 mmol/L (ref 22–32)
Calcium: 8.6 mg/dL — ABNORMAL LOW (ref 8.9–10.3)
Chloride: 105 mmol/L (ref 98–111)
Creatinine, Ser: 0.98 mg/dL (ref 0.61–1.24)
GFR, Estimated: >60 mL/min (ref >60)
Glucose, Bld: 109 mg/dL — ABNORMAL HIGH (ref 70–99)
Potassium: 3.8 mmol/L (ref 3.5–5.1)
Sodium: 139 mmol/L (ref 135–145)
Total Bilirubin: 0.8 mg/dL (ref 0.3–1.2)
Total Protein: 7.2 g/dL (ref 6.5–8.1)

## 2021-08-03 LAB — LIPID PANEL
Cholesterol: 168 mg/dL (ref 0–200)
HDL: 32 mg/dL — ABNORMAL LOW (ref >40)
LDL Cholesterol: 111 mg/dL — ABNORMAL HIGH (ref 0–99)
Total CHOL/HDL Ratio: 5.3 RATIO
Triglycerides: 125 mg/dL (ref <150)
VLDL: 25 mg/dL (ref 0–40)

## 2021-08-03 LAB — PSA: Prostatic Specific Antigen: 0.54 ng/mL (ref 0.00–4.00)

## 2021-08-04 LAB — IFOBT (OCCULT BLOOD): IFOBT: NEGATIVE

## 2021-09-29 ENCOUNTER — Telehealth: Payer: Self-pay

## 2021-09-29 NOTE — Telephone Encounter (Signed)
Called dermatology office & requested office notes faxed to clinic, fax # given.

## 2021-10-21 ENCOUNTER — Other Ambulatory Visit: Payer: Self-pay | Admitting: Physician Assistant

## 2021-10-21 DIAGNOSIS — E785 Hyperlipidemia, unspecified: Secondary | ICD-10-CM

## 2021-10-21 DIAGNOSIS — I10 Essential (primary) hypertension: Secondary | ICD-10-CM

## 2021-11-05 ENCOUNTER — Ambulatory Visit: Payer: Self-pay | Admitting: Physician Assistant

## 2021-11-05 ENCOUNTER — Other Ambulatory Visit: Payer: Self-pay

## 2021-11-05 ENCOUNTER — Other Ambulatory Visit (HOSPITAL_COMMUNITY)
Admission: RE | Admit: 2021-11-05 | Discharge: 2021-11-05 | Disposition: A | Discharge status: Home / Self Care | Payer: Self-pay | Source: Ambulatory Visit | Attending: Physician Assistant | Admitting: Physician Assistant

## 2021-11-05 ENCOUNTER — Encounter: Payer: Self-pay | Admitting: Physician Assistant

## 2021-11-05 VITALS — BP 148/81 | HR 80 | Temp 98.0°F | Wt 221.0 lb

## 2021-11-05 DIAGNOSIS — I10 Essential (primary) hypertension: Secondary | ICD-10-CM

## 2021-11-05 DIAGNOSIS — M25552 Pain in left hip: Secondary | ICD-10-CM

## 2021-11-05 DIAGNOSIS — K219 Gastro-esophageal reflux disease without esophagitis: Secondary | ICD-10-CM

## 2021-11-05 DIAGNOSIS — J449 Chronic obstructive pulmonary disease, unspecified: Secondary | ICD-10-CM

## 2021-11-05 DIAGNOSIS — E785 Hyperlipidemia, unspecified: Secondary | ICD-10-CM

## 2021-11-05 DIAGNOSIS — F17200 Nicotine dependence, unspecified, uncomplicated: Secondary | ICD-10-CM

## 2021-11-05 LAB — LIPID PANEL
Cholesterol: 191 mg/dL (ref 0–200)
HDL: 38 mg/dL — ABNORMAL LOW (ref >40)
LDL Cholesterol: 121 mg/dL — ABNORMAL HIGH (ref 0–99)
Total CHOL/HDL Ratio: 5 RATIO
Triglycerides: 161 mg/dL — ABNORMAL HIGH (ref <150)
VLDL: 32 mg/dL (ref 0–40)

## 2021-11-05 LAB — COMPREHENSIVE METABOLIC PANEL
ALT: 22 U/L (ref 0–44)
AST: 21 U/L (ref 15–41)
Albumin: 4 g/dL (ref 3.5–5.0)
Alkaline Phosphatase: 63 U/L (ref 38–126)
Anion gap: 7 (ref 5–15)
BUN: 23 mg/dL — ABNORMAL HIGH (ref 6–20)
CO2: 25 mmol/L (ref 22–32)
Calcium: 9.1 mg/dL (ref 8.9–10.3)
Chloride: 104 mmol/L (ref 98–111)
Creatinine, Ser: 1.02 mg/dL (ref 0.61–1.24)
GFR, Estimated: >60 mL/min (ref >60)
Glucose, Bld: 119 mg/dL — ABNORMAL HIGH (ref 70–99)
Potassium: 4.2 mmol/L (ref 3.5–5.1)
Sodium: 136 mmol/L (ref 135–145)
Total Bilirubin: 0.2 mg/dL — ABNORMAL LOW (ref 0.3–1.2)
Total Protein: 7.2 g/dL (ref 6.5–8.1)

## 2021-11-05 MED ORDER — AMLODIPINE BESYLATE 10 MG PO TABS: ORAL_TABLET | ORAL | 0 refills | Status: DC

## 2021-11-05 MED ORDER — LISINOPRIL 40 MG PO TABS: ORAL_TABLET | ORAL | 0 refills | Status: DC

## 2021-11-05 MED ORDER — CITALOPRAM HYDROBROMIDE 20 MG PO TABS: ORAL_TABLET | ORAL | 0 refills | Status: DC

## 2021-11-05 MED ORDER — METOPROLOL TARTRATE 100 MG PO TABS: 100.0000 mg | ORAL_TABLET | Freq: Two times a day (BID) | ORAL | 3 refills | Status: DC

## 2021-11-05 MED ORDER — ATORVASTATIN CALCIUM 80 MG PO TABS: 80.0000 mg | ORAL_TABLET | Freq: Every day | ORAL | 3 refills | Status: DC

## 2021-11-05 MED ORDER — OMEPRAZOLE 20 MG PO CPDR: DELAYED_RELEASE_CAPSULE | ORAL | 0 refills | Status: DC

## 2021-11-05 NOTE — Progress Notes (Signed)
BP (!) 148/81    Pulse 80    Temp 98 F (36.7 C)    Wt 221 lb (100.2 kg)    SpO2 98%    BMI 33.11 kg/m    Subjective:    Patient ID: [name], male    DOB: 02/22/1961, 61 y.o.   MRN: [id number]  HPI: [name] is a 61 y.o. male presenting on 11/05/2021 for Hypertension and Hyperlipidemia   HPI  Chief Complaint  Patient presents with   Hypertension   Hyperlipidemia     Pt says he has been having leg burning since his hip operation.  He then says that no, it didn't start then.  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  The hip aches when he walks.  It also hurts at night when he lays down.  He says he did physical therapy for a while but stopped because of a lack of patience.  He saw dermatology and they gave him shots and it cleared up his legs rash.  It didn't help his hip pain  Pt admits to having a Short temper.  He says the citalopram medicine helps.  He sys he has No dpersssion  He says his GERD is well controlled with the omeprazole  He got his covid shots & booster   Relevant past medical, surgical, family and social history reviewed and updated as indicated. Interim medical history since our last visit reviewed. Allergies and medications reviewed and updated.    Current Outpatient Medications:    albuterol (PROVENTIL HFA) 108 (90 Base) MCG/ACT inhaler, INHALE 2 PUFFS BY MOUTH EVERY 6 HOURS AS NEEDED FOR COUGHING, WHEEZING, OR SHORTNESS OF BREATH, Disp: 3 each, Rfl: 0   amLODipine (NORVASC) 10 MG tablet, TAKE 1 Tablet BY MOUTH ONCE EVERY DAY, Disp: 90 tablet, Rfl: 0   aspirin 81 MG chewable tablet, Chew 1 tablet (81 mg total) by mouth 2 (two) times daily., Disp: 30 tablet, Rfl: 0   atorvastatin (LIPITOR) 40 MG tablet, TAKE 1 Tablet BY MOUTH ONCE EVERY DAY AT 6PM, Disp: 90 tablet, Rfl: 0   citalopram (CELEXA) 20 MG tablet, TAKE 1 Tablet BY MOUTH ONCE EVERY DAY, Disp: 90 tablet, Rfl: 0   fluticasone-salmeterol (ADVAIR HFA) 45-21 MCG/ACT inhaler,  Inhale 2 puffs into the lungs 2 (two) times daily., Disp: 3 each, Rfl: 3   lisinopril (ZESTRIL) 40 MG tablet, TAKE 1 Tablet BY MOUTH ONCE EVERY DAY, Disp: 90 tablet, Rfl: 0   metoprolol tartrate (LOPRESSOR) 50 MG tablet, Take 1 tablet (50 mg total) by mouth 2 (two) times daily., Disp: 180 tablet, Rfl: 3   omeprazole (PRILOSEC) 20 MG capsule, TAKE 1 Capsule  BY MOUTH TWICE DAILY BEFORE A MEAL, Disp: 180 capsule, Rfl: 0   tamsulosin (FLOMAX) 0.4 MG CAPS capsule, Take 1 capsule (0.4 mg total) by mouth at bedtime. (Patient not taking: Reported on 08/19/2020), Disp: 30 capsule, Rfl: 0    Review of Systems  Per HPI unless specifically indicated above     Objective:    BP (!) 148/81    Pulse 80    Temp 98 F (36.7 C)    Wt 221 lb (100.2 kg)    SpO2 98%    BMI 33.11 kg/m   Wt Readings from Last 3 Encounters:  11/05/21 221 lb (100.2 kg)  07/30/21 219 lb 8 oz (99.6 kg)  03/10/21 213 lb (96.6 kg)    Physical Exam Vitals reviewed.  Constitutional:  General: He is not in acute distress.    Appearance: He is well-developed. He is not ill-appearing.  HENT:     Head: Normocephalic and atraumatic.  Cardiovascular:     Rate and Rhythm: Normal rate and regular rhythm.  Pulmonary:     Effort: Pulmonary effort is normal.     Breath sounds: Normal breath sounds. No wheezing.  Abdominal:     General: Bowel sounds are normal.     Palpations: Abdomen is soft.     Tenderness: There is no abdominal tenderness.  Musculoskeletal:     Cervical back: Neck supple.     Right hip: No deformity or tenderness. Normal range of motion.     Left hip: No deformity, tenderness or bony tenderness. Normal range of motion.     Left knee: Normal range of motion. No tenderness.     Right lower leg: No edema.     Left lower leg: No edema.  Lymphadenopathy:     Cervical: No cervical adenopathy.  Skin:    General: Skin is warm and dry.  Neurological:     Mental Status: He is alert and oriented to person,  place, and time.  Psychiatric:        Behavior: Behavior normal.    Results for orders placed or performed during the hospital encounter of 11/05/21  Lipid panel  Result Value Ref Range   Cholesterol 191 0 - 200 mg/dL   Triglycerides 540 (H) <150 mg/dL   HDL 38 (L) >08 mg/dL   Total CHOL/HDL Ratio 5.0 RATIO   VLDL 32 0 - 40 mg/dL   LDL Cholesterol 676 (H) 0 - 99 mg/dL  Comprehensive metabolic panel  Result Value Ref Range   Sodium 136 135 - 145 mmol/L   Potassium 4.2 3.5 - 5.1 mmol/L   Chloride 104 98 - 111 mmol/L   CO2 25 22 - 32 mmol/L   Glucose, Bld 119 (H) 70 - 99 mg/dL   BUN 23 (H) 6 - 20 mg/dL   Creatinine, Ser 1.95 0.61 - 1.24 mg/dL   Calcium 9.1 8.9 - 09.3 mg/dL   Total Protein 7.2 6.5 - 8.1 g/dL   Albumin 4.0 3.5 - 5.0 g/dL   AST 21 15 - 41 U/L   ALT 22 0 - 44 U/L   Alkaline Phosphatase 63 38 - 126 U/L   Total Bilirubin 0.2 (L) 0.3 - 1.2 mg/dL   GFR, Estimated >26 >71 mL/min   Anion gap 7 5 - 15      Assessment & Plan:   Encounter Diagnoses  Name Primary?   Essential hypertension Yes   Hyperlipidemia, unspecified hyperlipidemia type    Chronic obstructive pulmonary disease, unspecified COPD type (HCC)    Tobacco use disorder    Left hip pain    Gastroesophageal reflux disease, unspecified whether esophagitis present      -reviewed labs with pt -Increase metoprolol for inadequately controlled htn.  He will continue the amlodipine and lisinopril -Increase atorvastatin for inadequately contolled dyslipidemia.  He is to continue to follow lowfat diet -discussed his hip.  Pt was offered referral for physical therapy.  Pt Declined PT.  He is counsled to increase walking  -counseled smoking cessation -pt to follow up 3 months.  He is to contact office sooner prn

## 2021-11-17 ENCOUNTER — Other Ambulatory Visit: Payer: Self-pay | Admitting: Physician Assistant

## 2021-11-17 NOTE — Congregational Nurse Program (Signed)
Completed follow up call r/to phone call received by pt's wife on behalf of patient and inquiring if [employer] was yet initiated.      Plan Pt wife stated she was able to reach [employer] on the behalf of husband to verify coverage  -Phone call was also initiated to confirm the exact enrollment period (11/12/2021 thru 10/25/2022) and to confirm that patient medication were being processed on today.    [employer] is working on filling meds per phone call inquiry on today

## 2022-01-13 ENCOUNTER — Other Ambulatory Visit: Payer: Self-pay | Admitting: Physician Assistant

## 2022-01-13 DIAGNOSIS — I10 Essential (primary) hypertension: Secondary | ICD-10-CM

## 2022-01-13 DIAGNOSIS — R7309 Other abnormal glucose: Secondary | ICD-10-CM

## 2022-01-13 DIAGNOSIS — Z131 Encounter for screening for diabetes mellitus: Secondary | ICD-10-CM

## 2022-01-13 DIAGNOSIS — E785 Hyperlipidemia, unspecified: Secondary | ICD-10-CM

## 2022-02-03 ENCOUNTER — Ambulatory Visit: Payer: Self-pay | Admitting: Physician Assistant

## 2022-02-11 ENCOUNTER — Encounter: Payer: Self-pay | Admitting: Physician Assistant

## 2022-02-11 ENCOUNTER — Ambulatory Visit: Payer: Self-pay | Admitting: Physician Assistant

## 2022-02-11 ENCOUNTER — Other Ambulatory Visit (HOSPITAL_COMMUNITY)
Admission: RE | Admit: 2022-02-11 | Discharge: 2022-02-11 | Disposition: A | Discharge status: Home / Self Care | Payer: Self-pay | Source: Ambulatory Visit | Attending: Physician Assistant | Admitting: Physician Assistant

## 2022-02-11 VITALS — BP 128/68 | HR 65 | Temp 98.2°F | Wt 217.0 lb

## 2022-02-11 DIAGNOSIS — I10 Essential (primary) hypertension: Secondary | ICD-10-CM | POA: Insufficient documentation

## 2022-02-11 DIAGNOSIS — J449 Chronic obstructive pulmonary disease, unspecified: Secondary | ICD-10-CM

## 2022-02-11 DIAGNOSIS — Z131 Encounter for screening for diabetes mellitus: Secondary | ICD-10-CM | POA: Insufficient documentation

## 2022-02-11 DIAGNOSIS — R7309 Other abnormal glucose: Secondary | ICD-10-CM | POA: Insufficient documentation

## 2022-02-11 DIAGNOSIS — E785 Hyperlipidemia, unspecified: Secondary | ICD-10-CM | POA: Insufficient documentation

## 2022-02-11 DIAGNOSIS — F17200 Nicotine dependence, unspecified, uncomplicated: Secondary | ICD-10-CM

## 2022-02-11 LAB — LIPID PANEL
Cholesterol: 167 mg/dL (ref 0–200)
HDL: 28 mg/dL — ABNORMAL LOW (ref >40)
LDL Cholesterol: 84 mg/dL (ref 0–99)
Total CHOL/HDL Ratio: 6 RATIO
Triglycerides: 273 mg/dL — ABNORMAL HIGH (ref <150)
VLDL: 55 mg/dL — ABNORMAL HIGH (ref 0–40)

## 2022-02-11 LAB — HEMOGLOBIN A1C
Hgb A1c MFr Bld: 5.8 % — ABNORMAL HIGH (ref 4.8–5.6)
Mean Plasma Glucose: 119.76 mg/dL

## 2022-02-11 LAB — COMPREHENSIVE METABOLIC PANEL
ALT: 21 U/L (ref 0–44)
AST: 18 U/L (ref 15–41)
Albumin: 3.7 g/dL (ref 3.5–5.0)
Alkaline Phosphatase: 63 U/L (ref 38–126)
Anion gap: 6 (ref 5–15)
BUN: 22 mg/dL — ABNORMAL HIGH (ref 6–20)
CO2: 25 mmol/L (ref 22–32)
Calcium: 8.7 mg/dL — ABNORMAL LOW (ref 8.9–10.3)
Chloride: 106 mmol/L (ref 98–111)
Creatinine, Ser: 0.95 mg/dL (ref 0.61–1.24)
GFR, Estimated: >60 mL/min (ref >60)
Glucose, Bld: 117 mg/dL — ABNORMAL HIGH (ref 70–99)
Potassium: 3.9 mmol/L (ref 3.5–5.1)
Sodium: 137 mmol/L (ref 135–145)
Total Bilirubin: 0.5 mg/dL (ref 0.3–1.2)
Total Protein: 7 g/dL (ref 6.5–8.1)

## 2022-02-11 NOTE — Progress Notes (Signed)
? ?BP 128/68   Pulse 65   Temp 98.2 ?F (36.8 ?C)   Wt 217 lb (98.4 kg)   SpO2 94%   BMI 32.51 kg/m?   ? ?Subjective:  ? ? Patient ID: [name], male    DOB: 1961/05/10, 61 y.o.   MRN: [id number] ? ?HPI: ?[name] is a 61 y.o. male presenting on 02/11/2022 for No chief complaint on file. ? ? ?HPI ? ? ?Pt is 60yoM with routine follow up for HTN, dyslipidemia and copd.  He says he is doing well and he has no complaints.  He says his mood is good.   ?   ? ? ?Relevant past medical, surgical, family and social history reviewed and updated as indicated. Interim medical history since our last visit reviewed. ?Allergies and medications reviewed and updated. ? ? ?Current Outpatient Medications:  ?  albuterol (PROVENTIL HFA) 108 (90 Base) MCG/ACT inhaler, INHALE 2 PUFFS BY MOUTH EVERY 6 HOURS AS NEEDED FOR COUGHING, WHEEZING, OR SHORTNESS OF BREATH, Disp: 3 each, Rfl: 0 ?  amLODipine (NORVASC) 10 MG tablet, TAKE 1 Tablet BY MOUTH ONCE EVERY DAY, Disp: 90 tablet, Rfl: 0 ?  aspirin 81 MG chewable tablet, Chew 1 tablet (81 mg total) by mouth 2 (two) times daily., Disp: 30 tablet, Rfl: 0 ?  atorvastatin (LIPITOR) 80 MG tablet, Take 1 tablet (80 mg total) by mouth daily., Disp: 90 tablet, Rfl: 3 ?  citalopram (CELEXA) 20 MG tablet, TAKE 1 Tablet BY MOUTH ONCE EVERY DAY, Disp: 90 tablet, Rfl: 0 ?  fluticasone-salmeterol (ADVAIR HFA) 45-21 MCG/ACT inhaler, Inhale 2 puffs into the lungs 2 (two) times daily., Disp: 3 each, Rfl: 3 ?  lisinopril (ZESTRIL) 40 MG tablet, TAKE 1 Tablet BY MOUTH ONCE EVERY DAY, Disp: 90 tablet, Rfl: 0 ?  metoprolol tartrate (LOPRESSOR) 100 MG tablet, Take 1 tablet (100 mg total) by mouth 2 (two) times daily., Disp: 180 tablet, Rfl: 3 ?  omeprazole (PRILOSEC) 20 MG capsule, TAKE 1 Capsule  BY MOUTH TWICE DAILY BEFORE A MEAL, Disp: 180 capsule, Rfl: 0 ?  tamsulosin (FLOMAX) 0.4 MG CAPS capsule, Take 1 capsule (0.4 mg total) by mouth at bedtime. (Patient not taking: Reported on 08/19/2020), Disp: 30  capsule, Rfl: 0 ? ? ? ?Review of Systems ? ?Per HPI unless specifically indicated above ? ?   ?Objective:  ?  ?BP 128/68   Pulse 65   Temp 98.2 ?F (36.8 ?C)   Wt 217 lb (98.4 kg)   SpO2 94%   BMI 32.51 kg/m?   ?Wt Readings from Last 3 Encounters:  ?02/11/22 217 lb (98.4 kg)  ?11/05/21 221 lb (100.2 kg)  ?07/30/21 219 lb 8 oz (99.6 kg)  ?  ?Physical Exam ?Vitals reviewed.  ?Constitutional:   ?   General: He is not in acute distress. ?   Appearance: He is well-developed. He is not toxic-appearing.  ?HENT:  ?   Head: Normocephalic and atraumatic.  ?Cardiovascular:  ?   Rate and Rhythm: Normal rate and regular rhythm.  ?Pulmonary:  ?   Effort: Pulmonary effort is normal.  ?   Breath sounds: Normal breath sounds. No wheezing.  ?Abdominal:  ?   General: Bowel sounds are normal.  ?   Palpations: Abdomen is soft.  ?   Tenderness: There is no abdominal tenderness.  ?Musculoskeletal:  ?   Cervical back: Neck supple.  ?   Right lower leg: No edema.  ?   Left lower leg: No edema.  ?  Lymphadenopathy:  ?   Cervical: No cervical adenopathy.  ?Skin: ?   General: Skin is warm and dry.  ?Neurological:  ?   Mental Status: He is alert and oriented to person, place, and time.  ?Psychiatric:     ?   Behavior: Behavior normal.  ? ? ?Results for orders placed or performed during the hospital encounter of 02/11/22  ?Lipid panel  ?Result Value Ref Range  ? Cholesterol 167 0 - 200 mg/dL  ? Triglycerides 273 (H) <150 mg/dL  ? HDL 28 (L) >40 mg/dL  ? Total CHOL/HDL Ratio 6.0 RATIO  ? VLDL 55 (H) 0 - 40 mg/dL  ? LDL Cholesterol 84 0 - 99 mg/dL  ?Comprehensive metabolic panel  ?Result Value Ref Range  ? Sodium 137 135 - 145 mmol/L  ? Potassium 3.9 3.5 - 5.1 mmol/L  ? Chloride 106 98 - 111 mmol/L  ? CO2 25 22 - 32 mmol/L  ? Glucose, Bld 117 (H) 70 - 99 mg/dL  ? BUN 22 (H) 6 - 20 mg/dL  ? Creatinine, Ser 0.95 0.61 - 1.24 mg/dL  ? Calcium 8.7 (L) 8.9 - 10.3 mg/dL  ? Total Protein 7.0 6.5 - 8.1 g/dL  ? Albumin 3.7 3.5 - 5.0 g/dL  ? AST 18 15 - 41  U/L  ? ALT 21 0 - 44 U/L  ? Alkaline Phosphatase 63 38 - 126 U/L  ? Total Bilirubin 0.5 0.3 - 1.2 mg/dL  ? GFR, Estimated >60 >60 mL/min  ? Anion gap 6 5 - 15  ? ?   ?Assessment & Plan:  ? ?Encounter Diagnoses  ?Name Primary?  ? Essential hypertension Yes  ? Hyperlipidemia, unspecified hyperlipidemia type   ? Chronic obstructive pulmonary disease, unspecified COPD type (HCC)   ? Tobacco use disorder   ? ? ? ? ?-reviewed labs with pt.   ?-pt to continue current medications ?-encouraged smoking cessation ?-pt to follow up 3 months.  He is to contact office sooner prn ? ?

## 2022-02-16 ENCOUNTER — Other Ambulatory Visit: Payer: Self-pay | Admitting: Physician Assistant

## 2022-03-03 ENCOUNTER — Ambulatory Visit (HOSPITAL_COMMUNITY)
Admission: RE | Admit: 2022-03-03 | Discharge: 2022-03-03 | Disposition: A | Discharge status: Home / Self Care | Payer: Self-pay | Source: Ambulatory Visit | Attending: Physician Assistant | Admitting: Physician Assistant

## 2022-03-03 ENCOUNTER — Encounter: Payer: Self-pay | Admitting: Physician Assistant

## 2022-03-03 ENCOUNTER — Ambulatory Visit: Payer: Self-pay | Admitting: Physician Assistant

## 2022-03-03 VITALS — BP 132/78 | HR 61 | Temp 97.3°F | Wt 217.0 lb

## 2022-03-03 DIAGNOSIS — M79641 Pain in right hand: Secondary | ICD-10-CM

## 2022-03-03 DIAGNOSIS — S62644A Nondisplaced fracture of proximal phalanx of right ring finger, initial encounter for closed fracture: Secondary | ICD-10-CM

## 2022-03-03 DIAGNOSIS — S6991XA Unspecified injury of right wrist, hand and finger(s), initial encounter: Secondary | ICD-10-CM

## 2022-03-03 NOTE — Congregational Nurse Program (Signed)
Pt contacted [facility]/Clara Gunn to receive assistance with applying for [facility] as result of referral per his pcp- shannon mcelroy and needing to be seen by orthopaedic surgeon ? ?Plan ?-pt completed the process partial (completed [employer] application, submitted most documents with exception of 3 to submitt in order to complete the process ? ?Pt/wife Dois Davenport L) understood instructions to return requested information as soon as possible ?

## 2022-03-03 NOTE — Progress Notes (Signed)
? ?BP 132/78   Pulse 61   Temp (!) 97.3 ?F (36.3 ?C)   Wt 217 lb (98.4 kg)   SpO2 98%   BMI 32.51 kg/m?   ? ?Subjective:  ? ? Patient ID: [name], male    DOB: Nov 25, 1960, 61 y.o.   MRN: [id number] ? ?HPI: ?[name] is a 61 y.o. male presenting on 03/03/2022 for Hand Injury (Pt injured his R hand on Monday trying to hook up his trailer and accidentally dropped the trailer on his hand. R hand is swollen and is unable to straighten his fingers, pt is able to make a fist. Pt took one of his daughters rx pain medication last night pt unsure of med name.) ? ? ?HPI ? ?Chief Complaint  ?Patient presents with  ? Hand Injury  ?  Pt injured his R hand on Monday trying to hook up his trailer and accidentally dropped the trailer on his hand. R hand is swollen and is unable to straighten his fingers, pt is able to make a fist. Pt took one of his daughters rx pain medication last night pt unsure of med name.  ? ? ?Pain right hand after dropping a trailer on it Monday.  He is unable to bend his 4th finger.  ? ? ?Relevant past medical, surgical, family and social history reviewed and updated as indicated. Interim medical history since our last visit reviewed. ?Allergies and medications reviewed and updated. ? ? ?Current Outpatient Medications:  ?  albuterol (VENTOLIN HFA) 108 (90 Base) MCG/ACT inhaler, INHALE 2 PUFFS BY MOUTH EVERY 6 HOURS AS NEEDED FOR COUGHING, WHEEZING, OR SHORTNESS OF BREATH, Disp: 20.1 g, Rfl: 0 ?  amLODipine (NORVASC) 10 MG tablet, TAKE 1 Tablet BY MOUTH ONCE EVERY DAY, Disp: 90 tablet, Rfl: 0 ?  aspirin 81 MG chewable tablet, Chew 1 tablet (81 mg total) by mouth 2 (two) times daily., Disp: 30 tablet, Rfl: 0 ?  atorvastatin (LIPITOR) 80 MG tablet, Take 1 tablet (80 mg total) by mouth daily., Disp: 90 tablet, Rfl: 3 ?  citalopram (CELEXA) 20 MG tablet, TAKE 1 Tablet BY MOUTH ONCE EVERY DAY, Disp: 90 tablet, Rfl: 0 ?  fluticasone-salmeterol (ADVAIR HFA) 45-21 MCG/ACT inhaler, Inhale 2 puffs into the  lungs 2 (two) times daily., Disp: 3 each, Rfl: 3 ?  lisinopril (ZESTRIL) 40 MG tablet, TAKE 1 Tablet BY MOUTH ONCE EVERY DAY, Disp: 90 tablet, Rfl: 0 ?  metoprolol tartrate (LOPRESSOR) 100 MG tablet, Take 1 tablet (100 mg total) by mouth 2 (two) times daily., Disp: 180 tablet, Rfl: 3 ?  omeprazole (PRILOSEC) 20 MG capsule, TAKE 1 Capsule  BY MOUTH TWICE DAILY BEFORE A MEAL, Disp: 180 capsule, Rfl: 0 ?  tamsulosin (FLOMAX) 0.4 MG CAPS capsule, Take 1 capsule (0.4 mg total) by mouth at bedtime., Disp: 30 capsule, Rfl: 0 ? ? ? ?Review of Systems ? ?Per HPI unless specifically indicated above ? ?   ?Objective:  ?  ?BP 132/78   Pulse 61   Temp (!) 97.3 ?F (36.3 ?C)   Wt 217 lb (98.4 kg)   SpO2 98%   BMI 32.51 kg/m?   ?Wt Readings from Last 3 Encounters:  ?03/03/22 217 lb (98.4 kg)  ?02/11/22 217 lb (98.4 kg)  ?11/05/21 221 lb (100.2 kg)  ?  ?Physical Exam ?Constitutional:   ?   General: He is not in acute distress. ?   Appearance: He is not toxic-appearing.  ?HENT:  ?   Head: Normocephalic and  atraumatic.  ?Pulmonary:  ?   Effort: Pulmonary effort is normal. No respiratory distress.  ?Musculoskeletal:  ?   Right hand: Swelling, tenderness and bony tenderness present. Decreased range of motion. Normal capillary refill. Normal pulse.  ?   Comments: Tender distal 4th metacarpal.  Unable to flex/extend 4th finger.  Good sensation.  ?Skin: ?   General: Skin is warm and dry.  ?Neurological:  ?   Mental Status: He is alert and oriented to person, place, and time.  ?Psychiatric:     ?   Attention and Perception: Attention normal.     ?   Behavior: Behavior normal. Behavior is cooperative.  ? ? ?XRay-  not yet read by radiology.  Small Fx PIP 4th finger.  O/w no fx seen ? ? ? ?   ?Assessment & Plan:  ? ? ?Encounter Diagnoses  ?Name Primary?  ? Right hand pain Yes  ? Injury of right hand, initial encounter   ? Closed nondisplaced fracture of proximal phalanx of right ring finger, initial encounter   ? ? ? ?-Pt is sent for  xray and then return to office ?-Pt is given application for [employer] financial assistance ?-Ice - 10-20 minutes 3 or 4 times/day ?-Elevate  ?-Nsaids ?-Orthopedic referral ?-bulky dressing was applied due to supplies availability.  More specific dressing/splint to be done by orthopedics ?-pt is given note for work ? ? ? ?

## 2022-03-05 ENCOUNTER — Encounter: Payer: Self-pay | Admitting: Orthopedic Surgery

## 2022-03-05 ENCOUNTER — Ambulatory Visit (INDEPENDENT_AMBULATORY_CARE_PROVIDER_SITE_OTHER): Payer: Self-pay | Admitting: Orthopedic Surgery

## 2022-03-05 DIAGNOSIS — S63654A Sprain of metacarpophalangeal joint of right ring finger, initial encounter: Secondary | ICD-10-CM

## 2022-03-05 NOTE — Patient Instructions (Signed)
Ok to return to work Monday 5/15 ? ?Continue to work on range of motion  ? ?Elevate the hand ? ?Ibuprofen and ice for pain ?

## 2022-03-05 NOTE — Progress Notes (Signed)
New Patient Visit ? ?Assessment: ?[name] is a 61 y.o. male with the following: ?1. Sprain of metacarpophalangeal (MCP) joint of right ring finger, initial encounter ? ?Plan: ?[name] sustained a crush injury to the right ring finger, specifically the MCP joint.  Radiographs were concerning for a volar lip fracture of the proximal phalanx.  However, he has no tenderness to palpation in this area.  The bruising, swelling and tenderness is all at his right ring finger MCP joint.  He can resist with his ring finger, but is unable to fully straighten the MCP joint.  Not concerned about a tendon rupture at this point.  I think some of the swelling is affecting his ability to fully straighten his fingers.  I have urged him to get gently start working on range of motion of this finger, in order to regain all motion.  Medications as needed.  Follow-up in 2 weeks. ? ?Follow-up: ?Return in about 2 weeks (around 03/19/2022). ? ?Subjective: ? ?Chief Complaint  ?Patient presents with  ? Hand Injury  ?  Right hand injury, fx ring finger.  Soft dressing removed, cannot extend ring finger.  ? ? ?History of Present Illness: ?[name] is a 61 y.o. male who presents for evaluation of a right hand injury.  Just a couple of days ago, he was attaching a trailer to his vehicle, when the trailer came down on his right hand.  He had immediate pain and swelling.  He presented to an urgent care center.  They were concerned about a fracture of the proximal phalanx.  He has had a soft dressing in place.  His pain is improved.  He is able to make a full fist, but has difficulty fully extending the MCP joint of the ring finger.  He is not taking any medications. ? ? ?Review of Systems: ?No fevers or chills ?No numbness or tingling ?No chest pain ?No shortness of breath ?No bowel or bladder dysfunction ?No GI distress ?No headaches ? ? ?Medical History: ? ?Past Medical History:  ?Diagnosis Date  ? Acid reflux   ? Arthritis   ? COPD  (chronic obstructive pulmonary disease) ([place])   ? Depression   ? Hypertension   ? Kidney stone   ? Stroke [facility])   ? ? ?Past Surgical History:  ?Procedure Laterality Date  ? CHOLECYSTECTOMY    ? 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 Procedure: LEFT TOTAL HIP ARTHROPLASTY ANTERIOR APPROACH;  Surgeon: Mcarthur Rossetti, MD;  Location: [facility];  Service: Orthopedics;  Laterality: Left;  ? ? ?Family History  ?Problem Relation Age of Onset  ? Epilepsy Mother   ? Hypertension Father   ? Heart disease Father   ? Hyperlipidemia Father   ? Diabetes Father   ? ?Social History  ? ?Tobacco Use  ? Smoking status: Every Day  ?  Packs/day: 1.50  ?  Years: 40.00  ?  Pack years: 60.00  ?  Types: Cigarettes  ? Smokeless tobacco: Never  ? Tobacco comments:  ?  pack a day was two packs before stroke  ?Vaping Use  ? Vaping Use: Never used  ?Substance Use Topics  ? Alcohol use: Not Currently  ?  Alcohol/week: 0.0 standard drinks  ?  Comment: none since 2004. previously heavy drinker  ? Drug use: Not Currently  ?  Types: Marijuana  ?  Comment: last use 08/11/2018. Uses marijuana daily.  ? ? ?Allergies  ?Allergen Reactions  ? Morphine And  Related   ?  Altered mental status  ? Penicillins Hives  ?  Has patient had a PCN reaction causing immediate rash, facial/tongue/throat swelling, SOB or lightheadedness with hypotension: Yes ?Has patient had a PCN reaction causing severe rash involving mucus membranes or skin necrosis: No ?Has patient had a PCN reaction that required hospitalization: No ?Has patient had a PCN reaction occurring within the last 10 years: No ?If all of the above answers are "NO", then may proceed with Cephalosporin use. ?  ? ? ?Current Meds  ?Medication Sig  ? albuterol (VENTOLIN HFA) 108 (90 Base) MCG/ACT inhaler INHALE 2 PUFFS BY MOUTH EVERY 6 HOURS AS NEEDED FOR COUGHING, WHEEZING, OR SHORTNESS OF BREATH  ? amLODipine (NORVASC) 10 MG tablet TAKE 1 Tablet BY MOUTH ONCE EVERY DAY  ? aspirin 81 MG chewable tablet Chew  1 tablet (81 mg total) by mouth 2 (two) times daily.  ? atorvastatin (LIPITOR) 80 MG tablet Take 1 tablet (80 mg total) by mouth daily.  ? citalopram (CELEXA) 20 MG tablet TAKE 1 Tablet BY MOUTH ONCE EVERY DAY  ? fluticasone-salmeterol (ADVAIR HFA) 45-21 MCG/ACT inhaler Inhale 2 puffs into the lungs 2 (two) times daily.  ? lisinopril (ZESTRIL) 40 MG tablet TAKE 1 Tablet BY MOUTH ONCE EVERY DAY  ? metoprolol tartrate (LOPRESSOR) 100 MG tablet Take 1 tablet (100 mg total) by mouth 2 (two) times daily.  ? omeprazole (PRILOSEC) 20 MG capsule TAKE 1 Capsule  BY MOUTH TWICE DAILY BEFORE A MEAL  ? tamsulosin (FLOMAX) 0.4 MG CAPS capsule Take 1 capsule (0.4 mg total) by mouth at bedtime.  ? ? ?Objective: ?There were no vitals taken for this visit. ? ?Physical Exam: ? ?General: Alert and oriented. and No acute distress. ?Gait: Normal gait. ? ?Evaluation of the right hand demonstrates some swelling and redness about the MCP joint of the right ring finger.  He has no tenderness to palpation on the volar aspect of the PIP joint.  No swelling in this area.  No bruising is appreciated.  He can fully extend the DIP and PIP joints, but he demonstrates some stiffness at the ring finger MCP joint.  He is lacking approximately 30 degrees of extension, but is able to resist with the ring finger. ? ? ?IMAGING: ?I personally reviewed images previously obtained from the ED ? ?There is a small corticated fragment on the volar lip of the distal aspect of the right ring finger proximal phalanx.  Likely chronic.  No additional injuries are noted. ? ?New Medications:  ?No orders of the defined types were placed in this encounter. ? ? ? ? ?Mordecai Rasmussen, MD ? ?03/05/2022 ?11:39 PM ? ? ?

## 2022-03-19 ENCOUNTER — Ambulatory Visit: Payer: Self-pay | Admitting: Orthopedic Surgery

## 2022-03-23 ENCOUNTER — Ambulatory Visit: Payer: Self-pay | Admitting: Orthopedic Surgery

## 2022-03-23 ENCOUNTER — Telehealth: Payer: Self-pay | Admitting: Orthopedic Surgery

## 2022-03-23 NOTE — Telephone Encounter (Signed)
Per voice message received when office reopened from holiday, cancelled appointment for today, 03/23/22 due  to being away; said will call back to reschedule for sometime this month.

## 2022-04-29 ENCOUNTER — Other Ambulatory Visit: Payer: Self-pay | Admitting: Physician Assistant

## 2022-04-29 DIAGNOSIS — E785 Hyperlipidemia, unspecified: Secondary | ICD-10-CM

## 2022-04-29 DIAGNOSIS — I10 Essential (primary) hypertension: Secondary | ICD-10-CM

## 2022-05-13 ENCOUNTER — Other Ambulatory Visit (HOSPITAL_COMMUNITY)
Admission: RE | Admit: 2022-05-13 | Discharge: 2022-05-13 | Disposition: A | Discharge status: Home / Self Care | Payer: Self-pay | Source: Ambulatory Visit | Attending: Physician Assistant | Admitting: Physician Assistant

## 2022-05-13 ENCOUNTER — Encounter: Payer: Self-pay | Admitting: Physician Assistant

## 2022-05-13 ENCOUNTER — Ambulatory Visit: Payer: Self-pay | Admitting: Physician Assistant

## 2022-05-13 VITALS — BP 150/86 | HR 68 | Temp 97.9°F

## 2022-05-13 DIAGNOSIS — F439 Reaction to severe stress, unspecified: Secondary | ICD-10-CM

## 2022-05-13 DIAGNOSIS — I10 Essential (primary) hypertension: Secondary | ICD-10-CM

## 2022-05-13 DIAGNOSIS — E785 Hyperlipidemia, unspecified: Secondary | ICD-10-CM | POA: Insufficient documentation

## 2022-05-13 DIAGNOSIS — F17200 Nicotine dependence, unspecified, uncomplicated: Secondary | ICD-10-CM

## 2022-05-13 DIAGNOSIS — J449 Chronic obstructive pulmonary disease, unspecified: Secondary | ICD-10-CM

## 2022-05-13 DIAGNOSIS — K219 Gastro-esophageal reflux disease without esophagitis: Secondary | ICD-10-CM

## 2022-05-13 LAB — COMPREHENSIVE METABOLIC PANEL
ALT: 14 U/L (ref 0–44)
AST: 14 U/L — ABNORMAL LOW (ref 15–41)
Albumin: 3.5 g/dL (ref 3.5–5.0)
Alkaline Phosphatase: 71 U/L (ref 38–126)
Anion gap: 5 (ref 5–15)
BUN: 20 mg/dL (ref 6–20)
CO2: 23 mmol/L (ref 22–32)
Calcium: 8.5 mg/dL — ABNORMAL LOW (ref 8.9–10.3)
Chloride: 111 mmol/L (ref 98–111)
Creatinine, Ser: 1.01 mg/dL (ref 0.61–1.24)
GFR, Estimated: >60 mL/min (ref >60)
Glucose, Bld: 118 mg/dL — ABNORMAL HIGH (ref 70–99)
Potassium: 4.1 mmol/L (ref 3.5–5.1)
Sodium: 139 mmol/L (ref 135–145)
Total Bilirubin: 0.4 mg/dL (ref 0.3–1.2)
Total Protein: 6.9 g/dL (ref 6.5–8.1)

## 2022-05-13 LAB — LIPID PANEL
Cholesterol: 155 mg/dL (ref 0–200)
HDL: 27 mg/dL — ABNORMAL LOW (ref >40)
LDL Cholesterol: 67 mg/dL (ref 0–99)
Total CHOL/HDL Ratio: 5.7 RATIO
Triglycerides: 304 mg/dL — ABNORMAL HIGH (ref <150)
VLDL: 61 mg/dL — ABNORMAL HIGH (ref 0–40)

## 2022-05-13 MED ORDER — ALBUTEROL SULFATE HFA 108 (90 BASE) MCG/ACT IN AERS: 2.0000 | INHALATION_SPRAY | Freq: Four times a day (QID) | RESPIRATORY_TRACT | 0 refills | Status: DC | PRN

## 2022-05-13 MED ORDER — FLUTICASONE-SALMETEROL 45-21 MCG/ACT IN AERO: 2.0000 | INHALATION_SPRAY | Freq: Two times a day (BID) | RESPIRATORY_TRACT | 3 refills | Status: DC

## 2022-05-13 MED ORDER — OMEPRAZOLE 20 MG PO CPDR: 20.0000 mg | DELAYED_RELEASE_CAPSULE | Freq: Every day | ORAL | 0 refills | Status: DC

## 2022-05-13 MED ORDER — CITALOPRAM HYDROBROMIDE 20 MG PO TABS: 20.0000 mg | ORAL_TABLET | Freq: Every day | ORAL | 0 refills | Status: DC

## 2022-05-13 MED ORDER — AMLODIPINE BESYLATE 10 MG PO TABS: 10.0000 mg | ORAL_TABLET | Freq: Every day | ORAL | 0 refills | Status: DC

## 2022-05-13 MED ORDER — LISINOPRIL 40 MG PO TABS: 40.0000 mg | ORAL_TABLET | Freq: Every day | ORAL | 0 refills | Status: DC

## 2022-05-13 MED ORDER — ATORVASTATIN CALCIUM 80 MG PO TABS: 80.0000 mg | ORAL_TABLET | Freq: Every day | ORAL | 3 refills | Status: DC

## 2022-05-13 MED ORDER — METOPROLOL TARTRATE 100 MG PO TABS: 100.0000 mg | ORAL_TABLET | Freq: Two times a day (BID) | ORAL | 3 refills | Status: DC

## 2022-05-13 NOTE — Progress Notes (Signed)
BP (!) 150/86   Pulse 68   Temp 97.9 F (36.6 C)   SpO2 98%    Subjective:    Patient ID: [name], male    DOB: [birth year], 61 y.o.   MRN: [id number]  HPI: [name] is a 61 y.o. male presenting on 05/13/2022 for Hypertension and Hyperlipidemia   HPI   Chief Complaint  Patient presents with   Hypertension   Hyperlipidemia     He is using his inhaler maybe 2/week.  He says the advair helped but he ran out.  He is still smoking but has cut back to just a carton a week (ie 10packs/week).    He has been feeling tired.  He is Working out of town lately and travels 1- 1 1/2 hour daily to and from work  He isn't taking the flomax.  He says he isn't having any problems with his urine now.   He says the omeprazole is working well for his GERD and he is not having any symptoms.   He says the citalopram is helpful for stress and his mood.     Relevant past medical, surgical, family and social history reviewed and updated as indicated. Interim medical history since our last visit reviewed. Allergies and medications reviewed and updated.   Current Outpatient Medications:    albuterol (VENTOLIN HFA) 108 (90 Base) MCG/ACT inhaler, INHALE 2 PUFFS BY MOUTH EVERY 6 HOURS AS NEEDED FOR COUGHING, WHEEZING, OR SHORTNESS OF BREATH, Disp: 20.1 g, Rfl: 0   amLODipine (NORVASC) 10 MG tablet, TAKE 1 Tablet BY MOUTH ONCE EVERY DAY, Disp: 90 tablet, Rfl: 0   aspirin 81 MG chewable tablet, Chew 1 tablet (81 mg total) by mouth 2 (two) times daily., Disp: 30 tablet, Rfl: 0   atorvastatin (LIPITOR) 80 MG tablet, Take 1 tablet (80 mg total) by mouth daily., Disp: 90 tablet, Rfl: 3   citalopram (CELEXA) 20 MG tablet, TAKE 1 Tablet BY MOUTH ONCE EVERY DAY, Disp: 90 tablet, Rfl: 0   lisinopril (ZESTRIL) 40 MG tablet, TAKE 1 Tablet BY MOUTH ONCE EVERY DAY, Disp: 90 tablet, Rfl: 0   metoprolol tartrate (LOPRESSOR) 100 MG tablet, Take 1 tablet (100 mg total) by mouth 2 (two) times daily., Disp: 180  tablet, Rfl: 3   omeprazole (PRILOSEC) 20 MG capsule, TAKE 1 Capsule  BY MOUTH TWICE DAILY BEFORE A MEAL, Disp: 180 capsule, Rfl: 0   fluticasone-salmeterol (ADVAIR HFA) 45-21 MCG/ACT inhaler, Inhale 2 puffs into the lungs 2 (two) times daily. (Patient not taking: Reported on 05/13/2022), Disp: 3 each, Rfl: 3   tamsulosin (FLOMAX) 0.4 MG CAPS capsule, Take 1 capsule (0.4 mg total) by mouth at bedtime. (Patient not taking: Reported on 05/13/2022), Disp: 30 capsule, Rfl: 0   Review of Systems  Per HPI unless specifically indicated above     Objective:    BP (!) 150/86   Pulse 68   Temp 97.9 F (36.6 C)   SpO2 98%   Wt Readings from Last 3 Encounters:  03/03/22 217 lb (98.4 kg)  02/11/22 217 lb (98.4 kg)  11/05/21 221 lb (100.2 kg)    Physical Exam Vitals reviewed.  Constitutional:      General: He is not in acute distress.    Appearance: He is well-developed. He is not ill-appearing.  HENT:     Head: Normocephalic and atraumatic.  Cardiovascular:     Rate and Rhythm: Normal rate and regular rhythm.  Pulmonary:     Effort:  Pulmonary effort is normal. No respiratory distress.     Breath sounds: Wheezing present. No rhonchi or rales.     Comments: Scattered expiratory wheezes Abdominal:     General: Bowel sounds are normal.     Palpations: Abdomen is soft.     Tenderness: There is no abdominal tenderness.  Musculoskeletal:     Cervical back: Neck supple.  Lymphadenopathy:     Cervical: No cervical adenopathy.  Skin:    General: Skin is warm and dry.  Neurological:     Mental Status: He is alert and oriented to person, place, and time.  Psychiatric:        Behavior: Behavior normal.            Assessment & Plan:    Encounter Diagnoses  Name Primary?   Essential hypertension Yes   Hyperlipidemia, unspecified hyperlipidemia type    Chronic obstructive pulmonary disease, unspecified COPD type (HCC)    Tobacco use disorder    Gastroesophageal reflux disease,  unspecified whether esophagitis present    Stress        -pt labs are pending.  He will be called with results -will continue current medications for now.   -pt will RTO 6 weeks to recheck BP.  He is counseled on DASH diet to help lower the BP.  He is encouraged to review this with his wife who does most of his cooking/meal prep -pt is counseled on smoking cessation and given handouts on tips and information on a local smoking cessation class. -will restart his fluticasone inhaler.  He is given sample and rx sent to [facility] -pt to follow up 6 weeks.  He is to contact office sooner prn

## 2022-05-13 NOTE — Patient Instructions (Signed)

## 2022-06-24 ENCOUNTER — Telehealth: Payer: Self-pay

## 2022-06-24 ENCOUNTER — Ambulatory Visit: Payer: Self-pay | Admitting: Physician Assistant

## 2022-06-24 ENCOUNTER — Encounter: Payer: Self-pay | Admitting: Physician Assistant

## 2022-06-24 VITALS — BP 141/82 | HR 60 | Temp 97.7°F | Wt 214.0 lb

## 2022-06-24 DIAGNOSIS — I10 Essential (primary) hypertension: Secondary | ICD-10-CM

## 2022-06-24 DIAGNOSIS — E785 Hyperlipidemia, unspecified: Secondary | ICD-10-CM

## 2022-06-24 DIAGNOSIS — F17200 Nicotine dependence, unspecified, uncomplicated: Secondary | ICD-10-CM

## 2022-06-24 DIAGNOSIS — J449 Chronic obstructive pulmonary disease, unspecified: Secondary | ICD-10-CM

## 2022-06-24 MED ORDER — FLUTICASONE-SALMETEROL 45-21 MCG/ACT IN AERO: 2.0000 | INHALATION_SPRAY | Freq: Two times a day (BID) | RESPIRATORY_TRACT | 3 refills | Status: DC

## 2022-06-24 MED ORDER — HYDROCHLOROTHIAZIDE 12.5 MG PO CAPS: 12.5000 mg | ORAL_CAPSULE | Freq: Every day | ORAL | 0 refills | Status: DC

## 2022-06-24 NOTE — Progress Notes (Signed)
BP (!) 141/82   Pulse 60   Temp 97.7 F (36.5 C)   Wt 214 lb (97.1 kg)   SpO2 98%   BMI 32.07 kg/m    Subjective:    Patient ID: [name], male    DOB: 1961-02-05, 61 y.o.   MRN: [id number]  HPI: [name] is a 61 y.o. male presenting on 06/24/2022 for Hypertension   HPI  Chief Complaint  Patient presents with   Hypertension    He Checks his bp at home and it runs 145-160 range on top.   He says he is feeling fine and has no complaints.     Relevant past medical, surgical, family and social history reviewed and updated as indicated. Interim medical history since our last visit reviewed. Allergies and medications reviewed and updated.   Current Outpatient Medications:    albuterol (VENTOLIN HFA) 108 (90 Base) MCG/ACT inhaler, Inhale 2 puffs into the lungs every 6 (six) hours as needed for wheezing or shortness of breath., Disp: 3 each, Rfl: 0   amLODipine (NORVASC) 10 MG tablet, Take 1 tablet (10 mg total) by mouth daily., Disp: 90 tablet, Rfl: 0   aspirin 81 MG chewable tablet, Chew 1 tablet (81 mg total) by mouth 2 (two) times daily., Disp: 30 tablet, Rfl: 0   atorvastatin (LIPITOR) 80 MG tablet, Take 1 tablet (80 mg total) by mouth daily., Disp: 90 tablet, Rfl: 3   citalopram (CELEXA) 20 MG tablet, Take 1 tablet (20 mg total) by mouth daily., Disp: 90 tablet, Rfl: 0   fluticasone-salmeterol (ADVAIR HFA) 45-21 MCG/ACT inhaler, Inhale 2 puffs into the lungs 2 (two) times daily., Disp: 3 each, Rfl: 3   lisinopril (ZESTRIL) 40 MG tablet, Take 1 tablet (40 mg total) by mouth daily., Disp: 90 tablet, Rfl: 0   metoprolol tartrate (LOPRESSOR) 100 MG tablet, Take 1 tablet (100 mg total) by mouth 2 (two) times daily., Disp: 180 tablet, Rfl: 3   omeprazole (PRILOSEC) 20 MG capsule, Take 1 capsule (20 mg total) by mouth daily., Disp: 90 capsule, Rfl: 0   Review of Systems  Per HPI unless specifically indicated above     Objective:    BP (!) 141/82   Pulse 60   Temp  97.7 F (36.5 C)   Wt 214 lb (97.1 kg)   SpO2 98%   BMI 32.07 kg/m   Wt Readings from Last 3 Encounters:  06/24/22 214 lb (97.1 kg)  03/03/22 217 lb (98.4 kg)  02/11/22 217 lb (98.4 kg)    Physical Exam Vitals reviewed.  Constitutional:      General: He is not in acute distress.    Appearance: He is well-developed. He is not toxic-appearing.  HENT:     Head: Normocephalic and atraumatic.  Cardiovascular:     Rate and Rhythm: Normal rate and regular rhythm.  Pulmonary:     Effort: Pulmonary effort is normal.     Breath sounds: Normal breath sounds. No wheezing.  Abdominal:     General: Bowel sounds are normal.     Palpations: Abdomen is soft.     Tenderness: There is no abdominal tenderness.  Musculoskeletal:     Cervical back: Neck supple.     Right lower leg: No edema.     Left lower leg: No edema.  Lymphadenopathy:     Cervical: No cervical adenopathy.  Skin:    General: Skin is warm and dry.  Neurological:     Mental Status: He  is alert and oriented to person, place, and time.  Psychiatric:        Behavior: Behavior normal.           Assessment & Plan:   Encounter Diagnoses  Name Primary?   Essential hypertension Yes   Hyperlipidemia, unspecified hyperlipidemia type    Chronic obstructive pulmonary disease, unspecified COPD type (HCC)    Tobacco use disorder      -will obtain  Renal US due to HTN uncontrolled on 3 meds max dose.    -continue: Amlodipine 10 Lisinopril 40 Metorprolol 100 Add hctz 12.5  -pt was given  cafa / application for [employer] financial assistance  -pt was referred to Home bp monitoring program  -pt to follow up 1 month.  He is to contact office sooner prn

## 2022-06-24 NOTE — Telephone Encounter (Signed)
Referral received on today for patient  to enroll into remote monitoring hypertension program by [facility] provider, shannon mcelroy  First attempt was made to follow up and to contact patient for recruitment into the remote monitoring hypertension program however attempt was unsuccessful.    A voice mail and text message was completed.    Will make a 2nd attempt on Tues (9.5.23) upon return to work to contact pt again, if pt does not return call before close of business.

## 2022-06-29 ENCOUNTER — Telehealth: Payer: Self-pay | Admitting: Physician Assistant

## 2022-06-29 NOTE — Telephone Encounter (Signed)
Pt called saying that he has been "sick on his stomach" since starting "that new medication" that was prescribed on 8/31.   He says yes, it's the hctz.  He has taken 3 doses.  He denies emesis, diarrhea, but says he just has upset tummy and doesn't feel well.   He is to stop the medication.   If he doesn't get better or if he gets new symptoms, he should call office.   If he gets better, he can try the medication next week.  He has had no problems with his breathing, no swelling.   Pt states understanding.

## 2022-07-01 ENCOUNTER — Other Ambulatory Visit: Payer: Self-pay | Admitting: Physician Assistant

## 2022-07-01 ENCOUNTER — Other Ambulatory Visit (HOSPITAL_COMMUNITY): Payer: Self-pay | Admitting: Physician Assistant

## 2022-07-01 DIAGNOSIS — E278 Other specified disorders of adrenal gland: Secondary | ICD-10-CM

## 2022-07-01 DIAGNOSIS — R634 Abnormal weight loss: Secondary | ICD-10-CM

## 2022-07-09 ENCOUNTER — Ambulatory Visit (HOSPITAL_COMMUNITY): Payer: Self-pay

## 2022-07-15 ENCOUNTER — Ambulatory Visit (HOSPITAL_COMMUNITY)
Admission: RE | Admit: 2022-07-15 | Discharge: 2022-07-15 | Disposition: A | Discharge status: Home / Self Care | Payer: Self-pay | Source: Ambulatory Visit | Attending: Physician Assistant | Admitting: Physician Assistant

## 2022-07-15 DIAGNOSIS — I10 Essential (primary) hypertension: Secondary | ICD-10-CM | POA: Insufficient documentation

## 2022-07-26 ENCOUNTER — Ambulatory Visit: Payer: Self-pay | Admitting: Physician Assistant

## 2022-08-02 NOTE — Congregational Nurse Program (Signed)
Late Entry Phone Conversation on 9.27.23  Pt was f/u by phone by way of utilizing wife to assist with conversation and understanding(per request of patient for weekly update on today to address any challenges or successes of BP and Weight checks and Personal Action Plan.     States continues to feel good about the use of the equipment. with exception of having limitations of not being able to [place] the [employer] bp app on his and wife's phone until the company corrects the ability for all Android phones to be able to utilize.   States was thinking was able to get a different smart device to utilize and download but was unable to obtain   Successes -Continues to work on maintaining a low sodium diet ( (watching pork intake)  -Takes BP meds daily  -BP continues to remain stable and within goal of 145/85 based on age goal for BP    Barriers  -not been able to transfer BP data to [employer] home BP dashbooar - not being able to [employer] the [employer] bp app on his and wife's phone until the company corrects the ability for all Android phones to be able to utilize. -Leaarning how to say "NO"       Action Plan -Continue to maintain current personal action that has been thus far successful -Work on checking with other family members/friends to obtain a different smart device to attempt to [employer] app on -Continue work on smoking cigarettes and reducing the amount of cigraettes -Continue work on stress reduction by learning how to say No

## 2022-08-02 NOTE — Congregational Nurse Program (Signed)
Late Entry: 9.21.23  Visit  Patient completed enrollment in the Remote Monitoring Program for Hypertension.   Patient stated readiness to move forward with program to improve their BP.      Pt created a BP personal action plan/goals, demonstrated understanding of program guidelines, equipment use and had successful connectivity to [place] app on work related mobile device connecting to the BP dashboard.  Program incentives were also provided to assist with improving BP (Pill case, Water Bottle and Pedometer) A normal size BP cuff was utilized  Pt stated they will attempt to locate another smart device to download [employer] app on since his wife Katharine Look) and pt is unable to successfully download app unit "[employer]" corrects their app to accept all Android phones in which [place] Currently has   Pt Goals -Wife will assist with measuring bp due to pt limitations expressed during enrollment  -Pt agreed to monitor BP at least 3 times week to assist with obtaining 12 reading a week -Will agree to continue to adjust diet,   to limit pork intake to control his sodium, work on reducing pepsi intake -Will agree to [employer] equipment provided by Programmer, systems for measuring amount of steps taken daily -Will agree to working on smoking and reducing cigarette amount (used to be 4 pks/day, but is now down to 1 pk a day_ -Will attempt to work on stress reduction and learning about how to say "NO' when taking on other person problems  Action Plan -Follow up weekly with Nurse Health Coach to up date goals and action plan  Next session plannned for week of 9.27.23

## 2022-08-02 NOTE — Congregational Nurse Program (Signed)
Late Entry Phone Conversation on 10.3.23  Pt was f/u by phone by way of utilizing wife to assist with conversation and understanding(per request of patient for weekly update on today to address any challenges or successes of BP and Weight checks and Personal Action Plan.     States continues to feel good about the use of the equipment. with exception of having limitations of not being able to [place] the [employer] bp app on his and wife's phone until the company corrects the ability for all Android phones to be able to utilize.   States was thinking was able to get a different smart device to utilize and download but was unable to obtain   Successes -Continues to work on maintaining a low sodium diet   (Cut back pork intake- little to none) -Reduced the amount of cigarettes to 3-4 cigarettes a day -Takes BP meds daily -BP continues to remain stable and within goal of 145/85 based on age goal for BP -Worked on stress reduction by saying "No" more and taking on other persons problems less    Barriers  -not been able to transfer BP data to [facility] BP dashbooar - not being able to [employer] the [employer] bp app on his and wife's phone until the company corrects the ability for all Android phones to be able to utilize. -worried about health of his pet who is ill and have neglected monitoring bp and writing numbers down causing the agreed schedule to check BP to not be followed    Action Plan -Continue to maintain current personal action that has been discussed thus far  -Work on checking with other family members/friends to obtain a different smart device to attempt to [employer] app on -Continue work on smoking cigarettes and reducing the amount of cigraettes -Continue work on stress reduction by learning how to say No -Readjust BP monitoring schedule to get back on track with proper monitoring  -Write down BP readings and forward to [place] by text to be entered on BP  dashboard

## 2022-08-05 ENCOUNTER — Ambulatory Visit (HOSPITAL_COMMUNITY): Payer: Self-pay

## 2022-08-09 ENCOUNTER — Other Ambulatory Visit: Payer: Self-pay | Admitting: Physician Assistant

## 2022-08-11 ENCOUNTER — Ambulatory Visit: Payer: Self-pay | Admitting: Physician Assistant

## 2022-08-19 ENCOUNTER — Ambulatory Visit: Payer: Self-pay | Admitting: Physician Assistant

## 2022-08-26 ENCOUNTER — Ambulatory Visit: Payer: Self-pay | Admitting: Physician Assistant

## 2022-08-26 ENCOUNTER — Encounter: Payer: Self-pay | Admitting: Physician Assistant

## 2022-08-26 ENCOUNTER — Other Ambulatory Visit (HOSPITAL_COMMUNITY)
Admission: RE | Admit: 2022-08-26 | Discharge: 2022-08-26 | Disposition: A | Discharge status: Home / Self Care | Payer: Self-pay | Source: Ambulatory Visit | Attending: Physician Assistant | Admitting: Physician Assistant

## 2022-08-26 VITALS — BP 113/66 | HR 62 | Temp 97.7°F | Wt 203.8 lb

## 2022-08-26 DIAGNOSIS — F17200 Nicotine dependence, unspecified, uncomplicated: Secondary | ICD-10-CM

## 2022-08-26 DIAGNOSIS — J449 Chronic obstructive pulmonary disease, unspecified: Secondary | ICD-10-CM

## 2022-08-26 DIAGNOSIS — Z23 Encounter for immunization: Secondary | ICD-10-CM

## 2022-08-26 DIAGNOSIS — E785 Hyperlipidemia, unspecified: Secondary | ICD-10-CM | POA: Insufficient documentation

## 2022-08-26 DIAGNOSIS — I10 Essential (primary) hypertension: Secondary | ICD-10-CM

## 2022-08-26 LAB — COMPREHENSIVE METABOLIC PANEL
ALT: 13 U/L (ref 0–44)
AST: 15 U/L (ref 15–41)
Albumin: 4 g/dL (ref 3.5–5.0)
Alkaline Phosphatase: 70 U/L (ref 38–126)
Anion gap: 8 (ref 5–15)
BUN: 37 mg/dL — ABNORMAL HIGH (ref 6–20)
CO2: 25 mmol/L (ref 22–32)
Calcium: 9.1 mg/dL (ref 8.9–10.3)
Chloride: 105 mmol/L (ref 98–111)
Creatinine, Ser: 1.88 mg/dL — ABNORMAL HIGH (ref 0.61–1.24)
GFR, Estimated: 40 mL/min — ABNORMAL LOW (ref >60)
Glucose, Bld: 102 mg/dL — ABNORMAL HIGH (ref 70–99)
Potassium: 4.4 mmol/L (ref 3.5–5.1)
Sodium: 138 mmol/L (ref 135–145)
Total Bilirubin: 0.6 mg/dL (ref 0.3–1.2)
Total Protein: 7.4 g/dL (ref 6.5–8.1)

## 2022-08-26 LAB — LIPID PANEL
Cholesterol: 143 mg/dL (ref 0–200)
HDL: 26 mg/dL — ABNORMAL LOW (ref >40)
LDL Cholesterol: 71 mg/dL (ref 0–99)
Total CHOL/HDL Ratio: 5.5 RATIO
Triglycerides: 231 mg/dL — ABNORMAL HIGH (ref <150)
VLDL: 46 mg/dL — ABNORMAL HIGH (ref 0–40)

## 2022-08-26 NOTE — Progress Notes (Signed)
BP 90/66   Pulse 62   Temp 97.7 F (36.5 C)   Wt 203 lb 12 oz (92.4 kg)   SpO2 99%   BMI 30.53 kg/m    Subjective:    Patient ID: [name], male    DOB: 02-26-1961, 61 y.o.   MRN: [id number]  HPI: [name] is a 61 y.o. male presenting on 08/26/2022 for Hypertension   HPI   Chief Complaint  Patient presents with   Hypertension     Pt says he Checks his bp at home-   he says it was 170 then before that it was 150  He participates in the home BP monitoring program through [facility] but there are some technical issues.  From that program, the most recent recordings are from 07/28/22, 07/26/22, and 07/15/22  (these are the only measurements available).  The avg is 123/74, the high 145/80 and the low 107/67.  Pt says he feels well.  He denies HA, vision changes, dizziness, light-headedness, chest pain or changes in breathing.    Relevant past medical, surgical, family and social history reviewed and updated as indicated. Interim medical history since our last visit reviewed. Allergies and medications reviewed and updated.    Current Outpatient Medications:    albuterol (VENTOLIN HFA) 108 (90 Base) MCG/ACT inhaler, INHALE 2 PUFFS BY MOUTH EVERY 6 HOURS AS NEEDED FOR COUGHING, WHEEZING, OR SHORTNESS OF BREATH, Disp: 20.1 g, Rfl: 0   amLODipine (NORVASC) 10 MG tablet, TAKE 1 Tablet BY MOUTH ONCE EVERY DAY, Disp: 90 tablet, Rfl: 0   aspirin 81 MG chewable tablet, Chew 1 tablet (81 mg total) by mouth 2 (two) times daily., Disp: 30 tablet, Rfl: 0   atorvastatin (LIPITOR) 80 MG tablet, Take 1 tablet (80 mg total) by mouth daily., Disp: 90 tablet, Rfl: 3   citalopram (CELEXA) 20 MG tablet, TAKE 1 Tablet BY MOUTH ONCE EVERY DAY, Disp: 90 tablet, Rfl: 0   fluticasone-salmeterol (ADVAIR HFA) 45-21 MCG/ACT inhaler, Inhale 2 puffs into the lungs 2 (two) times daily., Disp: 3 each, Rfl: 3   hydrochlorothiazide (HYDRODIURIL) 25 MG tablet, TAKE 1/2 Tablet BY MOUTH ONCE EVERY DAY,  Disp: 45 tablet, Rfl: 0   lisinopril (ZESTRIL) 40 MG tablet, TAKE 1 Tablet BY MOUTH ONCE EVERY DAY, Disp: 90 tablet, Rfl: 0   metoprolol tartrate (LOPRESSOR) 100 MG tablet, Take 1 tablet (100 mg total) by mouth 2 (two) times daily., Disp: 180 tablet, Rfl: 3   omeprazole (PRILOSEC) 20 MG capsule, TAKE 1 Capsule BY MOUTH ONCE EVERY DAY, Disp: 90 capsule, Rfl: 0   Review of Systems  Per HPI unless specifically indicated above     Objective:    BP 90/66   Pulse 62   Temp 97.7 F (36.5 C)   Wt 203 lb 12 oz (92.4 kg)   SpO2 99%   BMI 30.53 kg/m   Wt Readings from Last 3 Encounters:  08/26/22 203 lb 12 oz (92.4 kg)  06/24/22 214 lb (97.1 kg)  03/03/22 217 lb (98.4 kg)    Bp recheck:    right- 105/69, left 113/66   Physical Exam Vitals reviewed.  Constitutional:      General: He is not in acute distress.    Appearance: He is well-developed. He is not toxic-appearing.     Comments: Very strong cigarette smoke smell  HENT:     Head: Normocephalic and atraumatic.  Cardiovascular:     Rate and Rhythm: Normal rate and regular rhythm.  Pulmonary:     Effort: Pulmonary effort is normal.     Breath sounds: Normal breath sounds. No wheezing.  Abdominal:     General: Bowel sounds are normal.     Palpations: Abdomen is soft.     Tenderness: There is no abdominal tenderness.  Musculoskeletal:     Cervical back: Neck supple.     Right lower leg: No edema.     Left lower leg: No edema.  Lymphadenopathy:     Cervical: No cervical adenopathy.  Skin:    General: Skin is warm and dry.  Neurological:     Mental Status: He is alert and oriented to person, place, and time.     Cranial Nerves: Cranial nerves 2-12 are intact.     Motor: No weakness or tremor.     Coordination: Coordination normal.     Gait: Gait normal.     Deep Tendon Reflexes: Reflexes normal.     Reflex Scores:      Patellar reflexes are 2+ on the right side and 2+ on the left side. Psychiatric:        Behavior:  Behavior normal.           Assessment & Plan:   Encounter Diagnoses  Name Primary?   Essential hypertension Yes   Hyperlipidemia, unspecified hyperlipidemia type    Chronic obstructive pulmonary disease, unspecified COPD type ([place])    Tobacco use disorder    Need for influenza vaccination      -Hold hctz- -update labs -reviewed results renal US -pt is to bring BP log and his machine to his follow up appointment in 2 weeks.  He is to contact office sooner prn

## 2022-08-26 NOTE — Patient Instructions (Addendum)
Bring blood pressure log sheet and blood pressure machine with you to your next appointment  -------------------------------------------  Influenza Vaccine Injection What is this medication? INFLUENZA VACCINE (in floo EN zuh vak SEEN) reduces the risk of the influenza (flu). It does not treat influenza. It is still possible to get influenza after receiving this vaccine, but the symptoms may be less severe or not last as long. It works by helping your immune system learn how to fight off a future infection. This medicine may be used for other purposes; ask your health care provider or pharmacist if you have questions. COMMON BRAND NAME(S): Afluria, Afluria Quadrivalent, Agriflu, Alfuria, FLUAD, FLUAD Quadrivalent, Fluarix, Fluarix Quadrivalent, Flublok, Flublok Quadrivalent, FLUCELVAX, FLUCELVAX Quadrivalent, Flulaval, Flulaval Quadrivalent, Fluvirin, Fluzone, Fluzone High-Dose, Fluzone Intradermal, Fluzone Quadrivalent What should I tell my care team before I take this medication? They need to know if you have any of these conditions: Bleeding disorder like hemophilia Fever or infection Guillain-Barre syndrome or other neurological problems Immune system problems Infection with the human immunodeficiency virus (HIV) or AIDS Low blood platelet counts Multiple sclerosis An unusual or allergic reaction to influenza virus vaccine, latex, other medications, foods, dyes, or preservatives. Different brands of vaccines contain different allergens. Some may contain latex or eggs. Talk to your care team about your allergies to make sure that you get the right vaccine. Pregnant or trying to get pregnant Breastfeeding How should I use this medication? This vaccine is injected into a muscle or under the skin. It is given by your care team. A copy of Vaccine Information Statements will be given before each vaccination. Be sure to read this sheet carefully each time. This sheet may change often. Talk to  your care team to see which vaccines are right for you. Some vaccines should not be used in all age groups. Overdosage: If you think you have taken too much of this medicine contact a poison control center or emergency room at once. NOTE: This medicine is only for you. Do not share this medicine with others. What if I miss a dose? This does not apply. What may interact with this medication? Certain medications that lower your immune system, such as etanercept, anakinra, infliximab, adalimumab Certain medications that prevent or treat blood clots, such as warfarin Chemotherapy or radiation therapy Phenytoin Steroid medications, such as prednisone or cortisone Theophylline Vaccines This list may not describe all possible interactions. Give your health care provider a list of all the medicines, herbs, non-prescription drugs, or dietary supplements you use. Also tell them if you smoke, drink alcohol, or use illegal drugs. Some items may interact with your medicine. What should I watch for while using this medication? Report any side effects that do not go away with your care team. Call your care team if any unusual symptoms occur within 6 weeks of receiving this vaccine. You may still catch the flu, but the illness is not usually as bad. You cannot get the flu from the vaccine. The vaccine will not protect against colds or other illnesses that may cause fever. The vaccine is needed every year. What side effects may I notice from receiving this medication? Side effects that you should report to your care team as soon as possible: Allergic reactions--skin rash, itching, hives, swelling of the face, lips, tongue, or throat Side effects that usually do not require medical attention (report these to your care team if they continue or are bothersome): Chills Fatigue Headache Joint pain Loss of appetite Muscle pain Nausea Pain,  redness, or irritation at injection site This list may not describe all  possible side effects. Call your doctor for medical advice about side effects. You may report side effects to FDA at 1-800-FDA-1088. Where should I keep my medication? The vaccine is only given by your care team. It will not be stored at home. NOTE: This sheet is a summary. It may not cover all possible information. If you have questions about this medicine, talk to your doctor, pharmacist, or health care provider.  2023 Elsevier/Gold Standard (2007-12-02 00:00:00)

## 2022-09-14 ENCOUNTER — Ambulatory Visit: Payer: Self-pay | Admitting: Physician Assistant

## 2022-09-21 ENCOUNTER — Ambulatory Visit: Payer: Self-pay | Admitting: Physician Assistant

## 2022-09-21 ENCOUNTER — Encounter: Payer: Self-pay | Admitting: Physician Assistant

## 2022-09-21 ENCOUNTER — Other Ambulatory Visit: Payer: Self-pay | Admitting: Physician Assistant

## 2022-09-21 VITALS — BP 142/85 | HR 65 | Temp 98.0°F

## 2022-09-21 DIAGNOSIS — Z1211 Encounter for screening for malignant neoplasm of colon: Secondary | ICD-10-CM

## 2022-09-21 DIAGNOSIS — E785 Hyperlipidemia, unspecified: Secondary | ICD-10-CM

## 2022-09-21 DIAGNOSIS — J449 Chronic obstructive pulmonary disease, unspecified: Secondary | ICD-10-CM

## 2022-09-21 DIAGNOSIS — R7989 Other specified abnormal findings of blood chemistry: Secondary | ICD-10-CM

## 2022-09-21 DIAGNOSIS — I10 Essential (primary) hypertension: Secondary | ICD-10-CM

## 2022-09-21 DIAGNOSIS — F17200 Nicotine dependence, unspecified, uncomplicated: Secondary | ICD-10-CM

## 2022-09-21 MED ORDER — FLUTICASONE-SALMETEROL 100-50 MCG/ACT IN AEPB: 1.0000 | INHALATION_SPRAY | Freq: Two times a day (BID) | RESPIRATORY_TRACT | 0 refills | Status: DC

## 2022-09-21 NOTE — Progress Notes (Signed)
BP (!) 142/85   Pulse 65   Temp 98 F (36.7 C)   SpO2 99%    Subjective:    Patient ID: [name], male    DOB: 1961-03-05, 61 y.o.   MRN: [id number]  HPI: [name] is a 61 y.o. male presenting on 09/21/2022 for Hypertension   HPI  Chief Complaint  Patient presents with   Hypertension     He brought in his bp machine and his bp log.  His log range is 109/64 to 163/92,   Mostly in the 120s.      Today in office his machine registers 154 /87.  He uses his albuterol mdi once/day.   He says he is not using advair inhaler.  He says he didn't get one from [facility].   He says his mood is "alright".  He says the citalopram seems to help.   when asked how he is feeling today, "alright".  Breathing stable.  No chest pains.  [place] is "alright".  Renal US in 07/15/22 unremarkable.   Pt has no complaints today.     Relevant past medical, surgical, family and social history reviewed and updated as indicated. Interim medical history since our last visit reviewed. Allergies and medications reviewed and updated.   Current Outpatient Medications:    albuterol (VENTOLIN HFA) 108 (90 Base) MCG/ACT inhaler, INHALE 2 PUFFS BY MOUTH EVERY 6 HOURS AS NEEDED FOR COUGHING, WHEEZING, OR SHORTNESS OF BREATH, Disp: 20.1 g, Rfl: 0   amLODipine (NORVASC) 10 MG tablet, TAKE 1 Tablet BY MOUTH ONCE EVERY DAY, Disp: 90 tablet, Rfl: 0   aspirin 81 MG chewable tablet, Chew 1 tablet (81 mg total) by mouth 2 (two) times daily., Disp: 30 tablet, Rfl: 0   atorvastatin (LIPITOR) 80 MG tablet, Take 1 tablet (80 mg total) by mouth daily., Disp: 90 tablet, Rfl: 3   citalopram (CELEXA) 20 MG tablet, TAKE 1 Tablet BY MOUTH ONCE EVERY DAY, Disp: 90 tablet, Rfl: 0   lisinopril (ZESTRIL) 40 MG tablet, TAKE 1 Tablet BY MOUTH ONCE EVERY DAY, Disp: 90 tablet, Rfl: 0   metoprolol tartrate (LOPRESSOR) 100 MG tablet, Take 1 tablet (100 mg total) by mouth 2 (two) times daily., Disp: 180 tablet, Rfl: 3    omeprazole (PRILOSEC) 20 MG capsule, TAKE 1 Capsule BY MOUTH ONCE EVERY DAY, Disp: 90 capsule, Rfl: 0   fluticasone-salmeterol (ADVAIR HFA) 45-21 MCG/ACT inhaler, Inhale 2 puffs into the lungs 2 (two) times daily. (Patient not taking: Reported on 09/21/2022), Disp: 3 each, Rfl: 3    Review of Systems  Per HPI unless specifically indicated above     Objective:    BP (!) 142/85   Pulse 65   Temp 98 F (36.7 C)   SpO2 99%   Wt Readings from Last 3 Encounters:  08/26/22 203 lb 12 oz (92.4 kg)  06/24/22 214 lb (97.1 kg)  03/03/22 217 lb (98.4 kg)    Physical Exam Vitals reviewed.  Constitutional:      General: He is not in acute distress.    Appearance: He is not ill-appearing.     Comments: Strong smoke odor  HENT:     Head: Normocephalic and atraumatic.  Cardiovascular:     Rate and Rhythm: Normal rate and regular rhythm.  Pulmonary:     Effort: Pulmonary effort is normal.     Breath sounds: Normal breath sounds. No wheezing.  Abdominal:     General: Bowel sounds are normal.  Palpations: Abdomen is soft.     Tenderness: There is no abdominal tenderness.  Musculoskeletal:     Cervical back: Neck supple.     Right lower leg: No edema.     Left lower leg: No edema.  Lymphadenopathy:     Cervical: No cervical adenopathy.  Skin:    General: Skin is warm and dry.  Neurological:     Mental Status: He is alert and oriented to person, place, and time.  Psychiatric:        Behavior: Behavior normal.           Assessment & Plan:   Encounter Diagnoses  Name Primary?   Essential hypertension Yes   Chronic obstructive pulmonary disease, unspecified COPD type ([facility])    Tobacco use disorder    Hyperlipidemia, unspecified hyperlipidemia type    Screening for colon cancer    Elevated serum creatinine       -Gave sample advair will check with [facility] on his rx -pt to Continue to monitor bp -pt was given FIT test for colon can screening -pt will need recheck  creatinine soon -F/u 3 months.  He is to contact office sooner prn

## 2022-10-12 IMAGING — DX DG HAND COMPLETE 3+V*R*
3 series · 3 of 3 positions shown · non-contrast
Comparison: None Available.

CLINICAL DATA: Right hand pain at base of 4th digit. Injury from
getting finger caught in trailer hitch.

EXAM:
RIGHT HAND - COMPLETE 3+ VIEW

[hand ap]
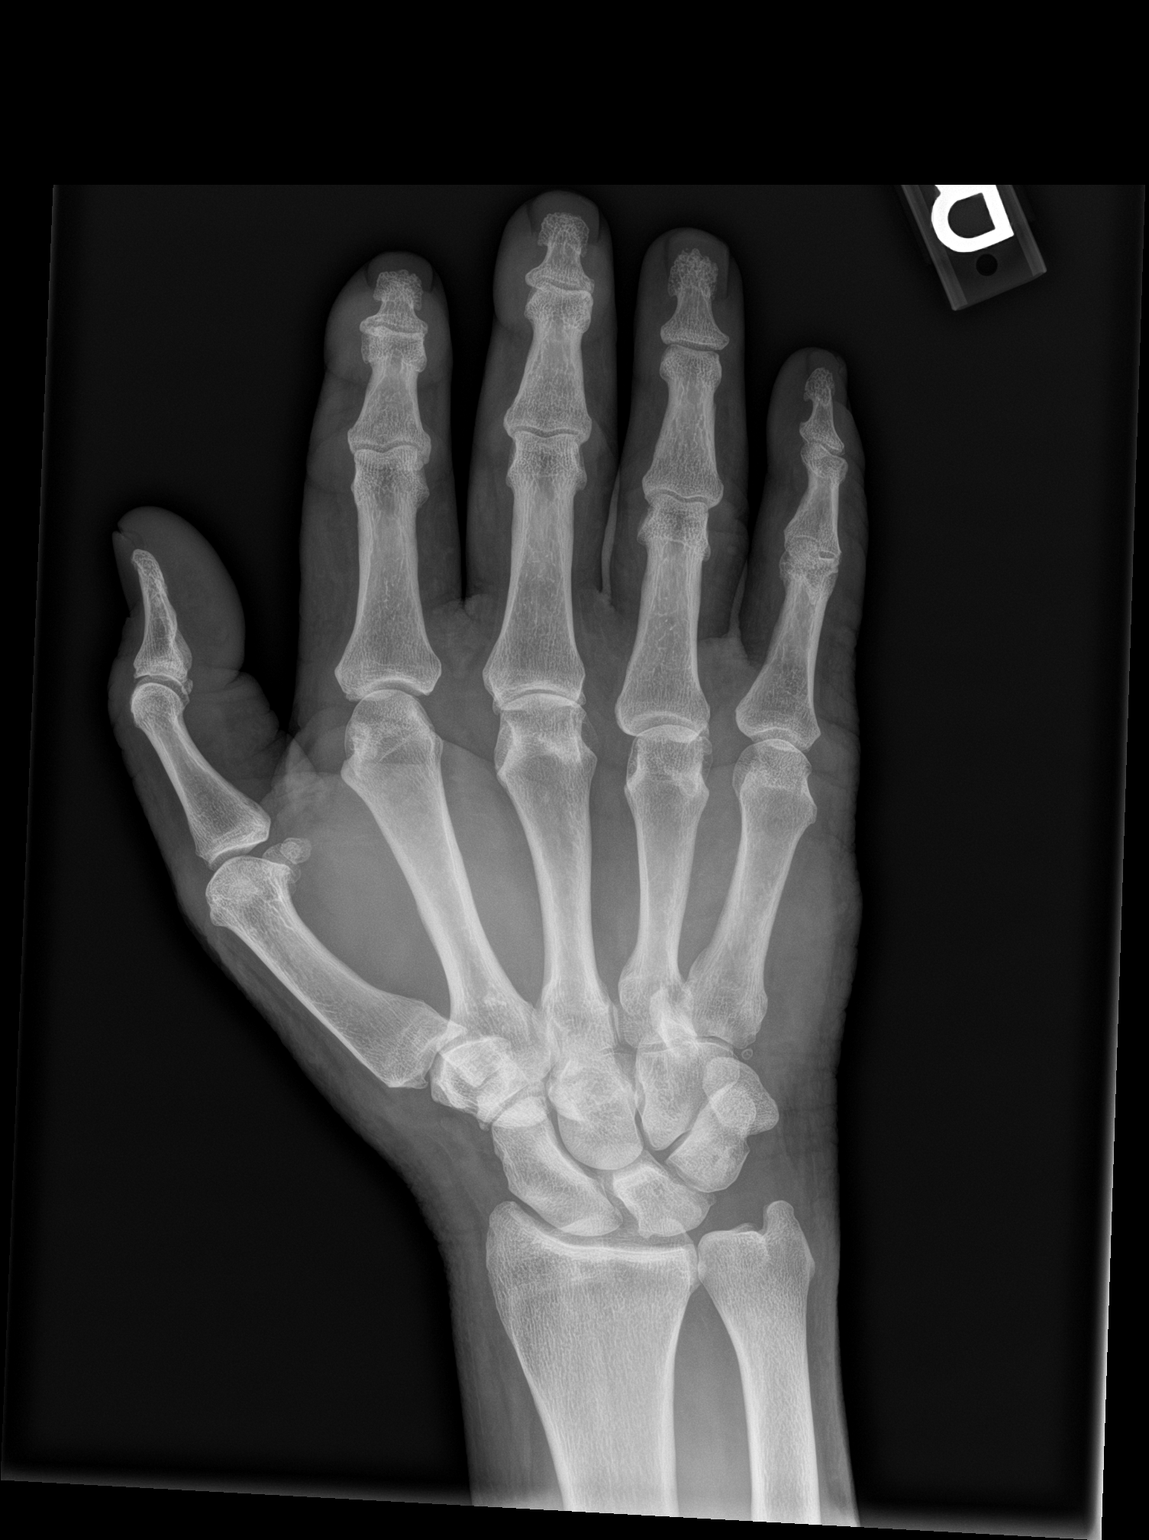

[hand obl]
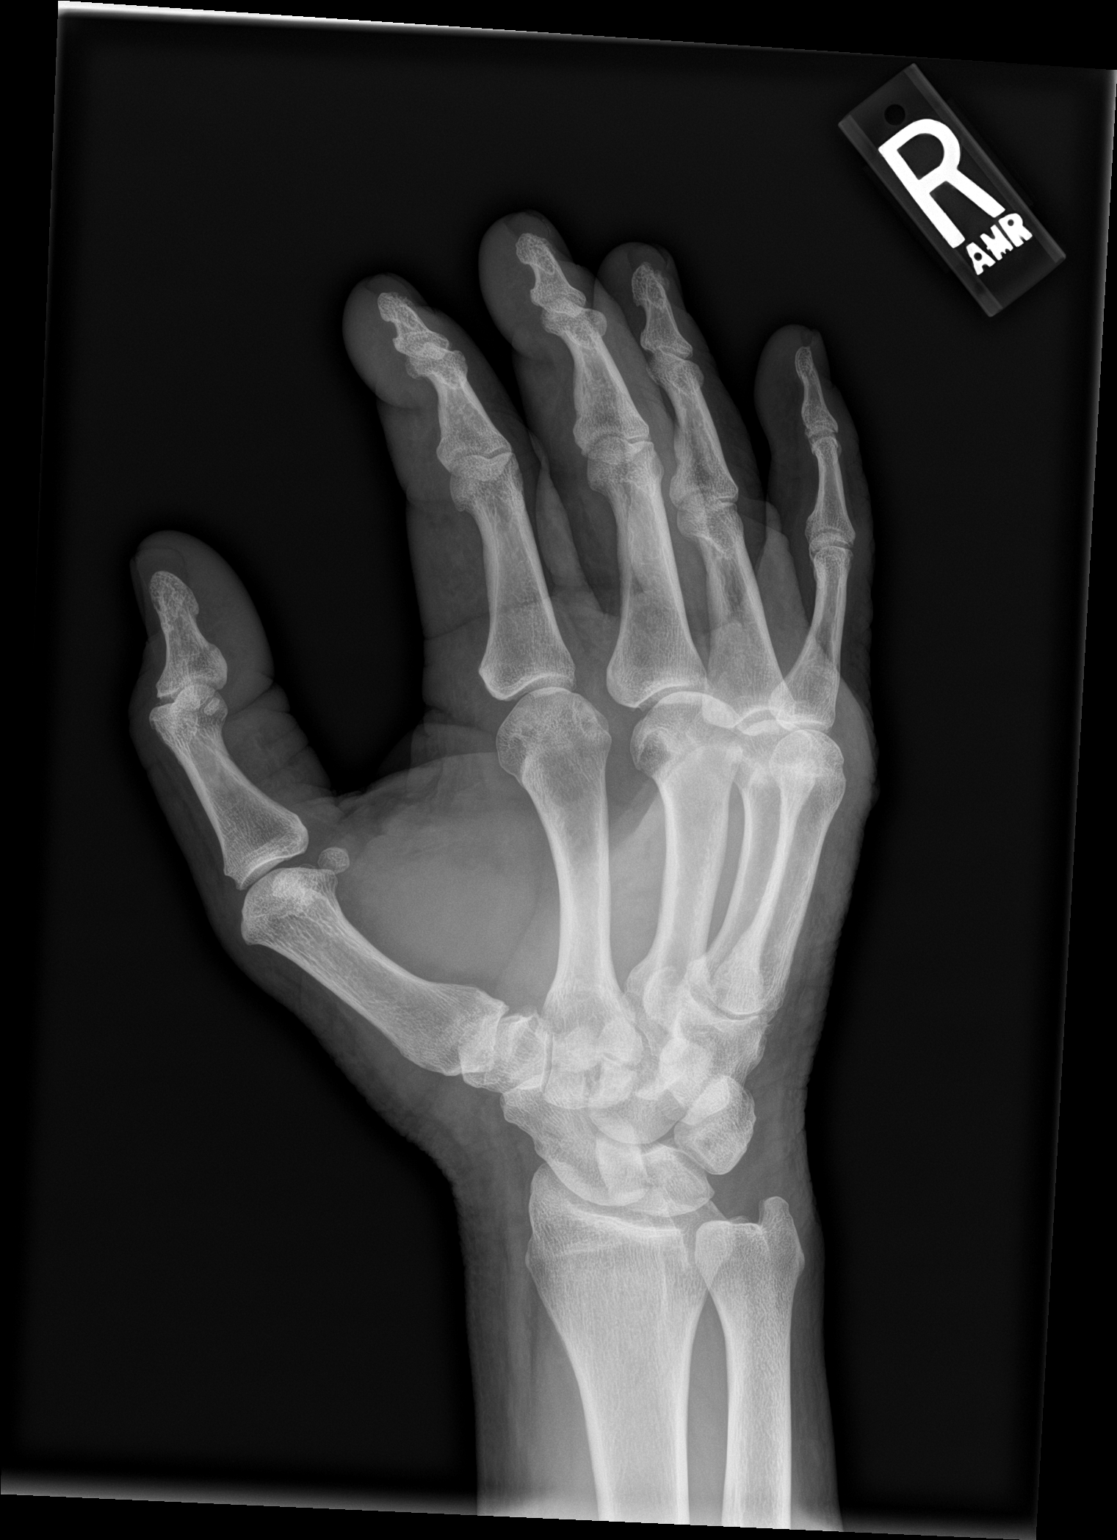

[hand lat]
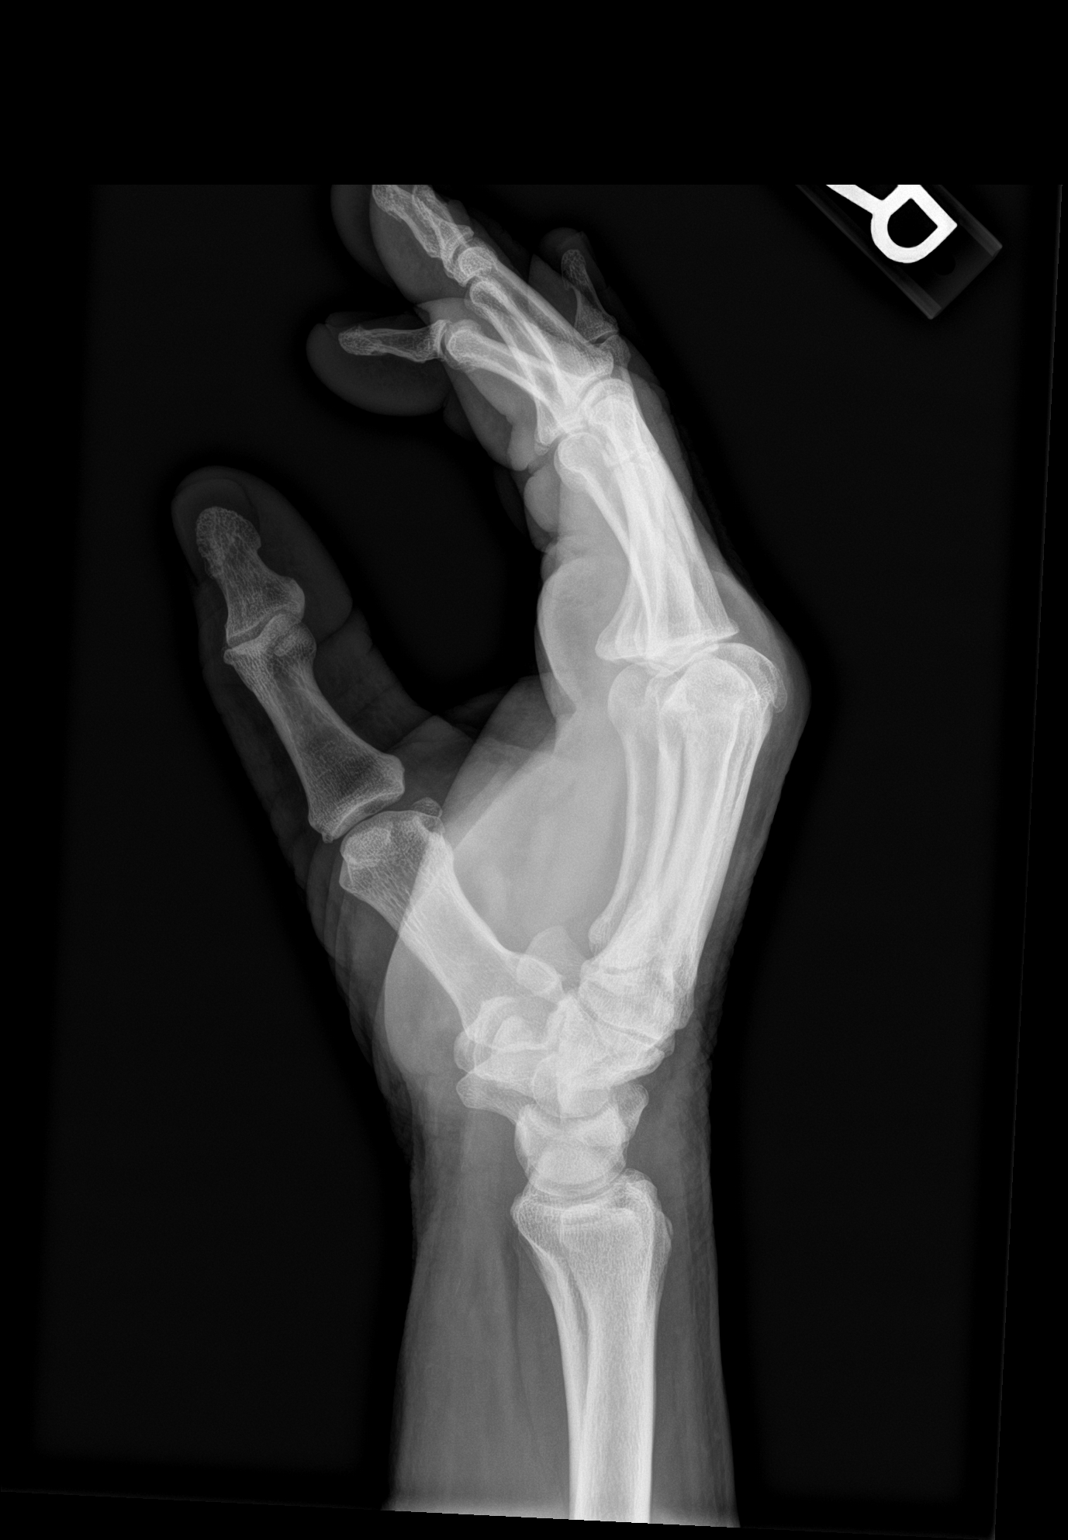

[3 of 3 positions shown; findings below may reference images not displayed]

FINDINGS: Query minimally displaced small fracture along the base of the
fourth proximal phalanx (see key image), seen only on the lateral
view. Normal mineralization. The soft tissues are unremarkable.
IMPRESSION: Query minimally displaced fracture of the base of the fourth
proximal phalanx (see key image).

## 2022-10-29 ENCOUNTER — Other Ambulatory Visit: Payer: Self-pay | Admitting: Physician Assistant

## 2022-11-29 ENCOUNTER — Other Ambulatory Visit: Payer: Self-pay | Admitting: Physician Assistant

## 2022-12-02 ENCOUNTER — Other Ambulatory Visit: Payer: Self-pay | Admitting: Physician Assistant

## 2022-12-02 DIAGNOSIS — I10 Essential (primary) hypertension: Secondary | ICD-10-CM

## 2022-12-02 DIAGNOSIS — E785 Hyperlipidemia, unspecified: Secondary | ICD-10-CM

## 2022-12-02 DIAGNOSIS — Z125 Encounter for screening for malignant neoplasm of prostate: Secondary | ICD-10-CM

## 2022-12-22 ENCOUNTER — Ambulatory Visit: Payer: Self-pay | Admitting: Physician Assistant

## 2022-12-23 ENCOUNTER — Encounter: Payer: Self-pay | Admitting: Radiology

## 2022-12-29 ENCOUNTER — Ambulatory Visit: Payer: Self-pay | Admitting: Physician Assistant

## 2023-01-19 ENCOUNTER — Telehealth: Payer: Self-pay | Admitting: Physician Assistant

## 2023-01-19 NOTE — Telephone Encounter (Signed)
Pt wife [name] left VM wanting to reschedule her husband's appointment tomorrow due to him being out of town for work.  She was called back.  Discussed with her that pt appointment was originally scheduled in February and now it will be in April.  Discussed that his renal function was poor at last check and really needs to be attended to.  He has labs ordered to be done (ordered in February).  Discussed that if he is in town for a day, he could call and possibly be worked in to be seen that day.  Pt wife says she was unaware of how important his follow-ups were.  She was given his rescheduled appointment which was made on the first day that she says he will be back in town.

## 2023-01-20 ENCOUNTER — Ambulatory Visit: Payer: Self-pay | Admitting: Physician Assistant

## 2023-01-31 ENCOUNTER — Ambulatory Visit: Payer: Self-pay | Admitting: Physician Assistant

## 2023-02-01 ENCOUNTER — Telehealth: Payer: Self-pay | Admitting: Physician Assistant

## 2023-02-01 ENCOUNTER — Encounter: Payer: Self-pay | Admitting: Physician Assistant

## 2023-02-01 ENCOUNTER — Ambulatory Visit: Payer: Self-pay | Admitting: Physician Assistant

## 2023-02-01 ENCOUNTER — Other Ambulatory Visit (HOSPITAL_COMMUNITY)
Admission: RE | Admit: 2023-02-01 | Discharge: 2023-02-01 | Disposition: A | Discharge status: Home / Self Care | Payer: Self-pay | Source: Ambulatory Visit | Attending: Physician Assistant | Admitting: Physician Assistant

## 2023-02-01 VITALS — BP 142/85 | HR 70 | Temp 97.2°F

## 2023-02-01 DIAGNOSIS — K219 Gastro-esophageal reflux disease without esophagitis: Secondary | ICD-10-CM

## 2023-02-01 DIAGNOSIS — J449 Chronic obstructive pulmonary disease, unspecified: Secondary | ICD-10-CM

## 2023-02-01 DIAGNOSIS — I10 Essential (primary) hypertension: Secondary | ICD-10-CM

## 2023-02-01 DIAGNOSIS — E785 Hyperlipidemia, unspecified: Secondary | ICD-10-CM

## 2023-02-01 DIAGNOSIS — Z125 Encounter for screening for malignant neoplasm of prostate: Secondary | ICD-10-CM | POA: Insufficient documentation

## 2023-02-01 DIAGNOSIS — F17200 Nicotine dependence, unspecified, uncomplicated: Secondary | ICD-10-CM

## 2023-02-01 LAB — COMPREHENSIVE METABOLIC PANEL
ALT: 13 U/L (ref 0–44)
AST: 16 U/L (ref 15–41)
Albumin: 3.7 g/dL (ref 3.5–5.0)
Alkaline Phosphatase: 77 U/L (ref 38–126)
Anion gap: 7 (ref 5–15)
BUN: 19 mg/dL (ref 6–20)
CO2: 27 mmol/L (ref 22–32)
Calcium: 8.8 mg/dL — ABNORMAL LOW (ref 8.9–10.3)
Chloride: 102 mmol/L (ref 98–111)
Creatinine, Ser: 1.04 mg/dL (ref 0.61–1.24)
GFR, Estimated: >60 mL/min (ref >60)
Glucose, Bld: 115 mg/dL — ABNORMAL HIGH (ref 70–99)
Potassium: 4.4 mmol/L (ref 3.5–5.1)
Sodium: 136 mmol/L (ref 135–145)
Total Bilirubin: 0.3 mg/dL (ref 0.3–1.2)
Total Protein: 7 g/dL (ref 6.5–8.1)

## 2023-02-01 LAB — LIPID PANEL
Cholesterol: 154 mg/dL (ref 0–200)
HDL: 29 mg/dL — ABNORMAL LOW (ref >40)
LDL Cholesterol: 94 mg/dL (ref 0–99)
Total CHOL/HDL Ratio: 5.3 RATIO
Triglycerides: 156 mg/dL — ABNORMAL HIGH (ref <150)
VLDL: 31 mg/dL (ref 0–40)

## 2023-02-01 LAB — PSA: Prostatic Specific Antigen: 0.5 ng/mL (ref 0.00–4.00)

## 2023-02-01 MED ORDER — OMEPRAZOLE 20 MG PO CPDR: DELAYED_RELEASE_CAPSULE | ORAL | 3 refills | Status: DC

## 2023-02-01 NOTE — Patient Instructions (Addendum)
Insomnia Insomnia is a sleep disorder that makes it difficult to fall asleep or stay asleep. Insomnia can cause fatigue, low energy, difficulty concentrating, mood swings, and poor performance at work or school. There are three different ways to classify insomnia: Difficulty falling asleep. Difficulty staying asleep. Waking up too early in the morning. Any type of insomnia can be long-term (chronic) or short-term (acute). Both are common. Short-term insomnia usually lasts for 3 months or less. Chronic insomnia occurs at least three times a week for longer than 3 months. What are the causes? Insomnia may be caused by another condition, situation, or substance, such as: Having certain mental health conditions, such as anxiety and depression. Using caffeine, alcohol, tobacco, or drugs. Having gastrointestinal conditions, such as gastroesophageal reflux disease (GERD). Having certain medical conditions. These include: Asthma. Alzheimer's disease. Stroke. Chronic pain. An overactive thyroid gland (hyperthyroidism). Other sleep disorders, such as restless legs syndrome and sleep apnea. Menopause. Sometimes, the cause of insomnia may not be known. What increases the risk? Risk factors for insomnia include: Gender. Females are affected more often than males. Age. Insomnia is more common as people get older. Stress and certain medical and mental health conditions. Lack of exercise. Having an irregular work schedule. This may include working night shifts and traveling between different time zones. What are the signs or symptoms? If you have insomnia, the main symptom is having trouble falling asleep or having trouble staying asleep. This may lead to other symptoms, such as: Feeling tired or having low energy. Feeling nervous about going to sleep. Not feeling rested in the morning. Having trouble concentrating. Feeling irritable, anxious, or depressed. How is this diagnosed? This condition  may be diagnosed based on: Your symptoms and medical history. Your health care provider may ask about: Your sleep habits. Any medical conditions you have. Your mental health. A physical exam. How is this treated? Treatment for insomnia depends on the cause. Treatment may focus on treating an underlying condition that is causing the insomnia. Treatment may also include: Medicines to help you sleep. Counseling or therapy. Lifestyle adjustments to help you sleep better. Follow these instructions at home: Eating and drinking  Limit or avoid alcohol, caffeinated beverages, and products that contain nicotine and tobacco, especially close to bedtime. These can disrupt your sleep. Do not eat a large meal or eat spicy foods right before bedtime. This can lead to digestive discomfort that can make it hard for you to sleep. Sleep habits  Keep a sleep diary to help you and your health care provider figure out what could be causing your insomnia. Write down: When you sleep. When you wake up during the night. How well you sleep and how rested you feel the next day. Any side effects of medicines you are taking. What you eat and drink. Make your bedroom a dark, comfortable place where it is easy to fall asleep. Put up shades or blackout curtains to block light from outside. Use a white noise machine to block noise. Keep the temperature cool. Limit screen use before bedtime. This includes: Not watching TV. Not using your smartphone, tablet, or computer. Stick to a routine that includes going to bed and waking up at the same times every day and night. This can help you fall asleep faster. Consider making a quiet activity, such as reading, part of your nighttime routine. Try to avoid taking naps during the day so that you sleep better at night. Get out of bed if you are still awake after   15 minutes of trying to sleep. Keep the lights down, but try reading or doing a quiet activity. When you feel  sleepy, go back to bed. General instructions Take over-the-counter and prescription medicines only as told by your health care provider. Exercise regularly as told by your health care provider. However, avoid exercising in the hours right before bedtime. Use relaxation techniques to manage stress. Ask your health care provider to suggest some techniques that may work well for you. These may include: Breathing exercises. Routines to release muscle tension. Visualizing peaceful scenes. Make sure that you drive carefully. Do not drive if you feel very sleepy. Keep all follow-up visits. This is important. Contact a health care provider if: You are tired throughout the day. You have trouble in your daily routine due to sleepiness. You continue to have sleep problems, or your sleep problems get worse. Get help right away if: You have thoughts about hurting yourself or someone else. Get help right away if you feel like you may hurt yourself or others, or have thoughts about taking your own life. Go to your nearest emergency room or: Call 911. Call the National Suicide Prevention Lifeline at 1-800-273-8255 or 988. This is open 24 hours a day. Text the Crisis Text Line at 741741. Summary Insomnia is a sleep disorder that makes it difficult to fall asleep or stay asleep. Insomnia can be long-term (chronic) or short-term (acute). Treatment for insomnia depends on the cause. Treatment may focus on treating an underlying condition that is causing the insomnia. Keep a sleep diary to help you and your health care provider figure out what could be causing your insomnia. This information is not intended to replace advice given to you by your health care provider. Make sure you discuss any questions you have with your health care provider. Document Revised: 09/21/2021 Document Reviewed: 09/21/2021 Elsevier Patient Education  2023 [employer] Inc[employer]  

## 2023-02-01 NOTE — Telephone Encounter (Signed)
Erroneous encounter

## 2023-02-01 NOTE — Progress Notes (Signed)
BP (!) 142/85   Pulse 70   Temp (!) 97.2 F (36.2 C)   SpO2 97%    Subjective:    Patient ID: [name], male    DOB: Feb 19, 1961, 62 y.o.   MRN: [id number]  HPI: [name] is a 62 y.o. male presenting on 02/01/2023 for Hypertension, Hyperlipidemia, and COPD   HPI   Chief Complaint  Patient presents with   Hypertension   Hyperlipidemia   COPD    Pt is 61yoM in for routine follow up.  His follow up is a bit delayed (was scheduled to RTO in February) due to working out of town a lot.  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  He says he stays tired all the time.     He says he doesn't sleep well.  He drinks a lot of sodas.  He also snores.    During check-in today, it was noted that pt now has active insurance. His wife is with him today.     Relevant past medical, surgical, family and social history reviewed and updated as indicated. Interim medical history since our last visit reviewed. Allergies and medications reviewed and updated.   Current Outpatient Medications:    albuterol (VENTOLIN HFA) 108 (90 Base) MCG/ACT inhaler, INHALE 2 PUFFS BY MOUTH EVERY 6 HOURS AS NEEDED FOR COUGHING, WHEEZING, OR SHORTNESS OF BREATH, Disp: 20.1 g, Rfl: 0   amLODipine (NORVASC) 10 MG tablet, TAKE 1 Tablet BY MOUTH ONCE EVERY DAY, Disp: 90 tablet, Rfl: 0   aspirin 81 MG chewable tablet, Chew 1 tablet (81 mg total) by mouth 2 (two) times daily., Disp: 30 tablet, Rfl: 0   atorvastatin (LIPITOR) 80 MG tablet, Take 1 tablet (80 mg total) by mouth daily., Disp: 90 tablet, Rfl: 3   citalopram (CELEXA) 20 MG tablet, TAKE 1 Tablet BY MOUTH ONCE EVERY DAY, Disp: 90 tablet, Rfl: 0   lisinopril (ZESTRIL) 40 MG tablet, TAKE 1 Tablet BY MOUTH ONCE EVERY DAY, Disp: 90 tablet, Rfl: 0   metoprolol tartrate (LOPRESSOR) 100 MG tablet, Take 1 tablet (100 mg total) by mouth 2 (two) times daily., Disp: 180 tablet, Rfl: 3   fluticasone-salmeterol  (ADVAIR HFA) 45-21 MCG/ACT inhaler, Inhale 2 puffs into the lungs 2 (two) times daily. (Patient not taking: Reported on 09/21/2022), Disp: 3 each, Rfl: 3   fluticasone-salmeterol (ADVAIR) 100-50 MCG/ACT AEPB, INHALE 1 PUFF BY MOUTH TWICE DAILY (EVERY 12 HOURS). RINSE MOUTH AFTER USE. (Patient not taking: Reported on 02/01/2023), Disp: 180 each, Rfl: 0   omeprazole (PRILOSEC) 20 MG capsule, TAKE 1 Capsule BY MOUTH ONCE EVERY DAY, Disp: 30 capsule, Rfl: 3    Review of Systems  Per HPI unless specifically indicated above     Objective:    BP (!) 142/85   Pulse 70   Temp (!) 97.2 F (36.2 C)   SpO2 97%   Wt Readings from Last 3 Encounters:  08/26/22 203 lb 12 oz (92.4 kg)  06/24/22 214 lb (97.1 kg)  03/03/22 217 lb (98.4 kg)    Physical Exam Constitutional:      General: He is not in acute distress.    Appearance: He is not toxic-appearing.  HENT:     Head: Normocephalic and atraumatic.  Cardiovascular:     Rate and Rhythm: Normal rate and regular rhythm.  Pulmonary:     Effort: Pulmonary effort is normal. No respiratory distress.     Breath sounds:  No wheezing or rhonchi.  Musculoskeletal:     Right lower leg: No edema.     Left lower leg: No edema.  Neurological:     Mental Status: He is alert and oriented to person, place, and time.  Psychiatric:        Attention and Perception: Attention normal.        Speech: Speech normal.        Behavior: Behavior normal. Behavior is cooperative.     Results for orders placed or performed during the hospital encounter of 02/01/23  Lipid panel  Result Value Ref Range   Cholesterol 154 0 - 200 mg/dL   Triglycerides 790 (H) <150 mg/dL   HDL 29 (L) >24 mg/dL   Total CHOL/HDL Ratio 5.3 RATIO   VLDL 31 0 - 40 mg/dL   LDL Cholesterol 94 0 - 99 mg/dL  Comprehensive metabolic panel  Result Value Ref Range   Sodium 136 135 - 145 mmol/L   Potassium 4.4 3.5 - 5.1 mmol/L   Chloride 102 98 - 111 mmol/L   CO2 27 22 - 32 mmol/L    Glucose, Bld 115 (H) 70 - 99 mg/dL   BUN 19 6 - 20 mg/dL   Creatinine, Ser 0.97 0.61 - 1.24 mg/dL   Calcium 8.8 (L) 8.9 - 10.3 mg/dL   Total Protein 7.0 6.5 - 8.1 g/dL   Albumin 3.7 3.5 - 5.0 g/dL   AST 16 15 - 41 U/L   ALT 13 0 - 44 U/L   Alkaline Phosphatase 77 38 - 126 U/L   Total Bilirubin 0.3 0.3 - 1.2 mg/dL   GFR, Estimated >35 >32 mL/min   Anion gap 7 5 - 15  PSA  Result Value Ref Range   Prostatic Specific Antigen 0.50 0.00 - 4.00 ng/mL      Assessment & Plan:    Encounter Diagnoses  Name Primary?   Essential hypertension Yes   Hyperlipidemia, unspecified hyperlipidemia type    Chronic obstructive pulmonary disease, unspecified COPD type    Tobacco use disorder    Gastroesophageal reflux disease, unspecified whether esophagitis present       Elevated Cr- resolved -reviewed labs with pt.  Renal function improved.  Pt reminded to drink plenty of water  HTN -bp mildly elevated today.   Pt admits to feeling a bit stressed today due to his wife being with him today.  Bp has been running good.   Pt will continue on his amlodipine, lisinopril and metoprolol  Copd -pt has albuterol mdi.  Recommended he discuss alternative maintenance inhaler since he says the advair "makes me sick".  Recommended smoking cessation  Dyslipidemia -Stable.  Continue atorvastatin  Fatigue -discussed insomnia.  Recommend pt cut back on sodas- none after 3pm for first week and then none after 12 noon.  Also discussed that in light of his snoring and sleepiness, he should get sleep study to evaluate for OSA.   Discussed that he will need to get his new PCP to order (we aren't on his insurance).  He should get some noticeable improvement with cutting back on sodas however.   HCM: FIT test given in November not yet returned.  He can discuss with his new PCP versus colonoscopy.     Pt was encouraged to go ahead and get established with new PCP; he understands that since he has insurance now, he  no longer qualifies for [facility]

## 2023-02-02 ENCOUNTER — Ambulatory Visit: Payer: Self-pay | Admitting: Physician Assistant

## 2023-04-20 ENCOUNTER — Other Ambulatory Visit: Payer: Self-pay | Admitting: Physician Assistant

## 2023-04-20 DIAGNOSIS — R7303 Prediabetes: Secondary | ICD-10-CM

## 2023-04-20 DIAGNOSIS — I10 Essential (primary) hypertension: Secondary | ICD-10-CM

## 2023-04-20 DIAGNOSIS — E785 Hyperlipidemia, unspecified: Secondary | ICD-10-CM

## 2023-04-21 ENCOUNTER — Encounter: Payer: Self-pay | Admitting: Physician Assistant

## 2023-04-21 ENCOUNTER — Ambulatory Visit: Payer: Self-pay | Admitting: Physician Assistant

## 2023-04-21 ENCOUNTER — Other Ambulatory Visit (HOSPITAL_COMMUNITY)
Admission: RE | Admit: 2023-04-21 | Discharge: 2023-04-21 | Disposition: A | Discharge status: Home / Self Care | Payer: Self-pay | Source: Ambulatory Visit | Attending: Physician Assistant | Admitting: Physician Assistant

## 2023-04-21 VITALS — BP 175/95 | HR 66 | Temp 98.4°F | Wt 218.0 lb

## 2023-04-21 DIAGNOSIS — L309 Dermatitis, unspecified: Secondary | ICD-10-CM

## 2023-04-21 DIAGNOSIS — F17200 Nicotine dependence, unspecified, uncomplicated: Secondary | ICD-10-CM

## 2023-04-21 DIAGNOSIS — I10 Essential (primary) hypertension: Secondary | ICD-10-CM

## 2023-04-21 DIAGNOSIS — J449 Chronic obstructive pulmonary disease, unspecified: Secondary | ICD-10-CM

## 2023-04-21 DIAGNOSIS — E785 Hyperlipidemia, unspecified: Secondary | ICD-10-CM

## 2023-04-21 DIAGNOSIS — R7303 Prediabetes: Secondary | ICD-10-CM

## 2023-04-21 LAB — LIPID PANEL
Cholesterol: 149 mg/dL (ref 0–200)
HDL: 32 mg/dL — ABNORMAL LOW (ref >40)
LDL Cholesterol: 63 mg/dL (ref 0–99)
Total CHOL/HDL Ratio: 4.7 RATIO
Triglycerides: 269 mg/dL — ABNORMAL HIGH (ref <150)
VLDL: 54 mg/dL — ABNORMAL HIGH (ref 0–40)

## 2023-04-21 LAB — COMPREHENSIVE METABOLIC PANEL
ALT: 18 U/L (ref 0–44)
AST: 17 U/L (ref 15–41)
Albumin: 3.6 g/dL (ref 3.5–5.0)
Alkaline Phosphatase: 73 U/L (ref 38–126)
Anion gap: 8 (ref 5–15)
BUN: 20 mg/dL (ref 6–20)
CO2: 26 mmol/L (ref 22–32)
Calcium: 8.7 mg/dL — ABNORMAL LOW (ref 8.9–10.3)
Chloride: 105 mmol/L (ref 98–111)
Creatinine, Ser: 1.01 mg/dL (ref 0.61–1.24)
GFR, Estimated: >60 mL/min (ref >60)
Glucose, Bld: 121 mg/dL — ABNORMAL HIGH (ref 70–99)
Potassium: 3.7 mmol/L (ref 3.5–5.1)
Sodium: 139 mmol/L (ref 135–145)
Total Bilirubin: 0.4 mg/dL (ref 0.3–1.2)
Total Protein: 6.6 g/dL (ref 6.5–8.1)

## 2023-04-21 LAB — HEMOGLOBIN A1C
Hgb A1c MFr Bld: 6 % — ABNORMAL HIGH (ref 4.8–5.6)
Mean Plasma Glucose: 126 mg/dL

## 2023-04-21 MED ORDER — LISINOPRIL 40 MG PO TABS: ORAL_TABLET | ORAL | 0 refills | Status: DC

## 2023-04-21 MED ORDER — OMEPRAZOLE 20 MG PO CPDR: DELAYED_RELEASE_CAPSULE | ORAL | 3 refills | Status: DC

## 2023-04-21 MED ORDER — METOPROLOL TARTRATE 100 MG PO TABS: 100.0000 mg | ORAL_TABLET | Freq: Two times a day (BID) | ORAL | 0 refills | Status: DC

## 2023-04-21 MED ORDER — CITALOPRAM HYDROBROMIDE 20 MG PO TABS: ORAL_TABLET | ORAL | 0 refills | Status: DC

## 2023-04-21 MED ORDER — FLUTICASONE-SALMETEROL 45-21 MCG/ACT IN AERO: 2.0000 | INHALATION_SPRAY | Freq: Two times a day (BID) | RESPIRATORY_TRACT | 0 refills | Status: DC

## 2023-04-21 MED ORDER — AMLODIPINE BESYLATE 10 MG PO TABS: ORAL_TABLET | ORAL | 0 refills | Status: DC

## 2023-04-21 MED ORDER — CLONIDINE HCL 0.1 MG PO TABS
0.1000 mg | ORAL_TABLET | Freq: Once | ORAL | Status: AC
  Administered 2023-04-21: 0.1 mg via ORAL

## 2023-04-21 MED ORDER — METOPROLOL TARTRATE 100 MG PO TABS: 100.0000 mg | ORAL_TABLET | Freq: Two times a day (BID) | ORAL | 3 refills | Status: DC

## 2023-04-21 MED ORDER — ATORVASTATIN CALCIUM 80 MG PO TABS: 80.0000 mg | ORAL_TABLET | Freq: Every day | ORAL | 3 refills | Status: DC

## 2023-04-21 MED ORDER — LISINOPRIL 20 MG PO TABS: 40.0000 mg | ORAL_TABLET | Freq: Every day | ORAL | 0 refills | Status: DC

## 2023-04-21 MED ORDER — ALBUTEROL SULFATE HFA 108 (90 BASE) MCG/ACT IN AERS: INHALATION_SPRAY | RESPIRATORY_TRACT | 0 refills | Status: DC

## 2023-04-21 NOTE — Patient Instructions (Signed)
Lisinopril- 20mg  from [facility]- take 2 tablets daily (costs less than 40mg ) You should get 40mg  tablets from [facility] and take 1 tablet daily (after you run out of pills from [facility])

## 2023-04-21 NOTE — Progress Notes (Signed)
BP (!) 175/95   Pulse 66   Temp 98.4 F (36.9 C)   Wt 218 lb (98.9 kg)   SpO2 97%   BMI 32.66 kg/m    Subjective:    Patient ID: [name], male    DOB: October 27, 1960, 62 y.o.   MRN: [id number]  HPI: [name] is a 62 y.o. male presenting on 04/21/2023 for Establish Care   HPI  Chief Complaint  Patient presents with   Establish Care    Pt had insurance at his last OV which made him ineligible for [facility].  He no longer has the insurance and has updated his enrollment with [facility].  He's been out of most of his meds about 2 weeks.  He says feels fine.  He denies HA, CP, vision changes.   He says he has cut back his smoking from 2ppd to about 1ppd.   He stopped using the advair with the powder because it didn't agree with him.   Relevant past medical, surgical, family and social history reviewed and updated as indicated. Interim medical history since our last visit reviewed. Allergies and medications reviewed and updated.   Current Outpatient Medications:    albuterol (VENTOLIN HFA) 108 (90 Base) MCG/ACT inhaler, INHALE 2 PUFFS BY MOUTH EVERY 6 HOURS AS NEEDED FOR COUGHING, WHEEZING, OR SHORTNESS OF BREATH, Disp: 20.1 g, Rfl: 0   aspirin 81 MG chewable tablet, Chew 1 tablet (81 mg total) by mouth 2 (two) times daily. (Patient taking differently: Chew 81 mg by mouth daily.), Disp: 30 tablet, Rfl: 0   atorvastatin (LIPITOR) 80 MG tablet, Take 1 tablet (80 mg total) by mouth daily., Disp: 90 tablet, Rfl: 3   metoprolol tartrate (LOPRESSOR) 100 MG tablet, Take 1 tablet (100 mg total) by mouth 2 (two) times daily., Disp: 180 tablet, Rfl: 3   omeprazole (PRILOSEC) 20 MG capsule, TAKE 1 Capsule BY MOUTH ONCE EVERY DAY, Disp: 30 capsule, Rfl: 3   amLODipine (NORVASC) 10 MG tablet, TAKE 1 Tablet BY MOUTH ONCE EVERY DAY (Patient not taking: Reported on 04/21/2023), Disp: 90 tablet, Rfl: 0   citalopram (CELEXA) 20 MG tablet, TAKE 1 Tablet BY MOUTH ONCE EVERY DAY (Patient not taking:  Reported on 04/21/2023), Disp: 90 tablet, Rfl: 0   fluticasone-salmeterol (ADVAIR HFA) 45-21 MCG/ACT inhaler, Inhale 2 puffs into the lungs 2 (two) times daily. (Patient not taking: Reported on 09/21/2022), Disp: 3 each, Rfl: 3   fluticasone-salmeterol (ADVAIR) 100-50 MCG/ACT AEPB, INHALE 1 PUFF BY MOUTH TWICE DAILY (EVERY 12 HOURS). RINSE MOUTH AFTER USE. (Patient not taking: Reported on 02/01/2023), Disp: 180 each, Rfl: 0   lisinopril (ZESTRIL) 40 MG tablet, TAKE 1 Tablet BY MOUTH ONCE EVERY DAY (Patient not taking: Reported on 04/21/2023), Disp: 90 tablet, Rfl: 0    Review of Systems  Per HPI unless specifically indicated above     Objective:    BP (!) 175/95   Pulse 66   Temp 98.4 F (36.9 C)   Wt 218 lb (98.9 kg)   SpO2 97%   BMI 32.66 kg/m   Wt Readings from Last 3 Encounters:  04/21/23 218 lb (98.9 kg)  08/26/22 203 lb 12 oz (92.4 kg)  06/24/22 214 lb (97.1 kg)    Physical Exam Vitals reviewed.  Constitutional:      General: He is not in acute distress.    Appearance: He is well-developed. He is not toxic-appearing.  HENT:     Head: Normocephalic and atraumatic.  Cardiovascular:  Rate and Rhythm: Normal rate and regular rhythm.  Pulmonary:     Effort: No respiratory distress.     Breath sounds: Wheezing present.     Comments: RR 20.  Scattered expiratory wheezes Abdominal:     General: Bowel sounds are normal.     Palpations: Abdomen is soft.     Tenderness: There is no abdominal tenderness.  Musculoskeletal:     Cervical back: Neck supple.     Comments: LE lesions/rash as pictured. No pitting edema.  Dp pulses palpable bilaterally.   Lymphadenopathy:     Cervical: No cervical adenopathy.  Skin:    General: Skin is warm and dry.     Findings: Rash present.  Neurological:     Mental Status: He is alert and oriented to person, place, and time.     Motor: No weakness.     Gait: Gait is intact.  Psychiatric:        Behavior: Behavior normal.               Assessment & Plan:   Encounter Diagnoses  Name Primary?   Essential hypertension Yes   Hyperlipidemia, unspecified hyperlipidemia type    Prediabetes    Chronic obstructive pulmonary disease, unspecified COPD type (HCC)    Tobacco use disorder    Dermatitis      HTN -meds refilled.  Discussed with pt that it will take 1-2 weeks for his meds to arrive from [facility] and in light of holiday next week, it may be closer to 2 weeks.  Discussed that he was at very high risk of another CVA or MI with his BP at current levels so bp meds sent to local pharmacy and he was urged to pick those up today.  He says he will get them today.   -pt was given dose clonidine 0.1mg  in office  Smoking -encouraged cessation -refer for screening CT chest.  Pt was given cafa application   COPD -albuterol and advair.  Pt is given sample (breztri) to last until his advair comes in the mail  Renal function -improved  HCM -pt was encouraged to Return FIT test that he was given in November for colon cancer screening.  He says he still has it at home  LE rash -seen by derm for this 2 year ago- dx with papular eczema and treated with steroids.  Will re-evaluate at follow-up appointment in 1 month  LABS -reviewed cmp and lipid panel results with pt.  Will call when results A1c available

## 2023-05-18 ENCOUNTER — Ambulatory Visit: Payer: Self-pay | Admitting: Physician Assistant

## 2023-05-23 ENCOUNTER — Other Ambulatory Visit: Payer: Self-pay | Admitting: Physician Assistant

## 2023-05-23 ENCOUNTER — Encounter: Payer: Self-pay | Admitting: Physician Assistant

## 2023-05-23 ENCOUNTER — Ambulatory Visit: Payer: Self-pay | Admitting: Physician Assistant

## 2023-05-23 VITALS — BP 153/83 | HR 70 | Temp 98.0°F

## 2023-05-23 DIAGNOSIS — Z1211 Encounter for screening for malignant neoplasm of colon: Secondary | ICD-10-CM

## 2023-05-23 DIAGNOSIS — I10 Essential (primary) hypertension: Secondary | ICD-10-CM

## 2023-05-23 LAB — POC FIT TEST STOOL: Fecal Occult Blood: NEGATIVE

## 2023-05-23 MED ORDER — CLONIDINE HCL 0.1 MG PO TABS
0.1000 mg | ORAL_TABLET | Freq: Two times a day (BID) | ORAL | 0 refills | Status: AC
Start: 2023-05-23 — End: ?

## 2023-05-23 NOTE — Progress Notes (Signed)
BP (!) 153/83   Pulse 70   Temp 98 F (36.7 C)   SpO2 96%    Subjective:    Patient ID: [name], male    DOB: 01/17/1961, 63 y.o.   MRN: [id number]  HPI: [name] is a 62 y.o. male presenting on 05/23/2023 for Hypertension   HPI   Chief Complaint  Patient presents with   Hypertension    Pt is 61yoM in to day for recheck bp.  He is taking his meds as prescribed.  He says he is feeling pretty good today.  He says he cut smoking "way back " to "just a pack" a day now.  He has no complaints.    Relevant past medical, surgical, family and social history reviewed and updated as indicated. Interim medical history since our last visit reviewed. Allergies and medications reviewed and updated.   Current Outpatient Medications:    albuterol (VENTOLIN HFA) 108 (90 Base) MCG/ACT inhaler, INHALE 2 PUFFS BY MOUTH EVERY 6 HOURS AS NEEDED FOR COUGHING, WHEEZING, OR SHORTNESS OF BREATH, Disp: 3 each, Rfl: 0   amLODipine (NORVASC) 10 MG tablet, TAKE 1 Tablet BY MOUTH ONCE EVERY DAY, Disp: 30 tablet, Rfl: 0   aspirin 81 MG chewable tablet, Chew 1 tablet (81 mg total) by mouth 2 (two) times daily. (Patient taking differently: Chew 81 mg by mouth daily.), Disp: 30 tablet, Rfl: 0   atorvastatin (LIPITOR) 80 MG tablet, Take 1 tablet (80 mg total) by mouth daily., Disp: 90 tablet, Rfl: 3   citalopram (CELEXA) 20 MG tablet, TAKE 1 Tablet BY MOUTH ONCE EVERY DAY, Disp: 90 tablet, Rfl: 0   lisinopril (ZESTRIL) 40 MG tablet, TAKE 1 Tablet BY MOUTH ONCE EVERY DAY, Disp: 90 tablet, Rfl: 0   metoprolol tartrate (LOPRESSOR) 100 MG tablet, Take 1 tablet (100 mg total) by mouth 2 (two) times daily., Disp: 60 tablet, Rfl: 0   omeprazole (PRILOSEC) 20 MG capsule, TAKE 1 Capsule BY MOUTH ONCE EVERY DAY, Disp: 30 capsule, Rfl: 3   fluticasone-salmeterol (ADVAIR HFA) 45-21 MCG/ACT inhaler, Inhale 2 puffs into the lungs 2 (two) times daily. (Patient not taking: Reported on 05/23/2023), Disp: 3 each, Rfl:  0   Review of Systems  Per HPI unless specifically indicated above     Objective:    BP (!) 153/83   Pulse 70   Temp 98 F (36.7 C)   SpO2 96%   Wt Readings from Last 3 Encounters:  04/21/23 218 lb (98.9 kg)  08/26/22 203 lb 12 oz (92.4 kg)  06/24/22 214 lb (97.1 kg)    Physical Exam Constitutional:      General: He is not in acute distress.    Appearance: He is not toxic-appearing.  HENT:     Head: Normocephalic and atraumatic.  Cardiovascular:     Rate and Rhythm: Normal rate and regular rhythm.  Pulmonary:     Effort: Pulmonary effort is normal. No respiratory distress.     Breath sounds: Normal breath sounds. No wheezing or rhonchi.  Musculoskeletal:     Right lower leg: No edema.     Left lower leg: No edema.  Neurological:     Mental Status: He is alert and oriented to person, place, and time.  Psychiatric:        Behavior: Behavior normal.           Assessment & Plan:    Encounter Diagnosis  Name Primary?   Essential hypertension Yes  HTN -currently on Amlodipine 10, Lisinopril 40, Metoprolol 100 -renal US 07/15/22 unremarkable -pt needs additional medication.  Will Not hydrochlorothiazide due to elevations of renal function.  Will Add clonidine 0.1 bid -pt will follow up 1 month to recheck bp.  He is to contact office sooner prn

## 2023-06-16 ENCOUNTER — Ambulatory Visit (HOSPITAL_COMMUNITY): Payer: Self-pay

## 2023-06-23 ENCOUNTER — Other Ambulatory Visit: Payer: Self-pay | Admitting: Physician Assistant

## 2023-06-23 ENCOUNTER — Encounter: Payer: Self-pay | Admitting: Physician Assistant

## 2023-06-23 ENCOUNTER — Ambulatory Visit: Payer: Self-pay | Admitting: Physician Assistant

## 2023-06-23 VITALS — BP 124/77 | HR 60 | Temp 97.1°F

## 2023-06-23 DIAGNOSIS — I10 Essential (primary) hypertension: Secondary | ICD-10-CM

## 2023-06-23 MED ORDER — AMLODIPINE BESYLATE 10 MG PO TABS: ORAL_TABLET | ORAL | 0 refills | Status: DC

## 2023-06-23 NOTE — Patient Instructions (Signed)
Check on your atorvastatin

## 2023-06-23 NOTE — Progress Notes (Signed)
BP 124/77   Pulse 60   Temp (!) 97.1 F (36.2 C)   SpO2 97%    Subjective:    Patient ID: [name], male    DOB: 02-Sep-1961, 62 y.o.   MRN: [id number]  HPI: [name] is a 62 y.o. male presenting on 06/23/2023 for Hypertension   HPI  Chief Complaint  Patient presents with   Hypertension    Pt is 61yoM who is in today to recheck bp after clonidine was added last month.  He has no complaints today.   Relevant past medical, surgical, family and social history reviewed and updated as indicated. Interim medical history since our last visit reviewed. Allergies and medications reviewed and updated.   Current Outpatient Medications:    albuterol (VENTOLIN HFA) 108 (90 Base) MCG/ACT inhaler, INHALE 2 PUFFS BY MOUTH EVERY 6 HOURS AS NEEDED FOR COUGHING, WHEEZING, OR SHORTNESS OF BREATH, Disp: 3 each, Rfl: 0   amLODipine (NORVASC) 10 MG tablet, TAKE 1 Tablet BY MOUTH ONCE EVERY DAY, Disp: 30 tablet, Rfl: 0   aspirin 81 MG chewable tablet, Chew 1 tablet (81 mg total) by mouth 2 (two) times daily. (Patient taking differently: Chew 81 mg by mouth daily.), Disp: 30 tablet, Rfl: 0   citalopram (CELEXA) 20 MG tablet, TAKE 1 Tablet BY MOUTH ONCE EVERY DAY, Disp: 90 tablet, Rfl: 0   cloNIDine (CATAPRES) 0.1 MG tablet, Take 1 tablet (0.1 mg total) by mouth 2 (two) times daily., Disp: 180 tablet, Rfl: 0   lisinopril (ZESTRIL) 40 MG tablet, TAKE 1 Tablet BY MOUTH ONCE EVERY DAY, Disp: 90 tablet, Rfl: 0   metoprolol tartrate (LOPRESSOR) 100 MG tablet, Take 1 tablet (100 mg total) by mouth 2 (two) times daily., Disp: 60 tablet, Rfl: 0   omeprazole (PRILOSEC) 20 MG capsule, TAKE 1 Capsule BY MOUTH ONCE EVERY DAY, Disp: 30 capsule, Rfl: 3   atorvastatin (LIPITOR) 80 MG tablet, Take 1 tablet (80 mg total) by mouth daily., Disp: 90 tablet, Rfl: 3   fluticasone-salmeterol (ADVAIR HFA) 45-21 MCG/ACT inhaler, Inhale 2 puffs into the lungs 2 (two) times daily. (Patient not taking: Reported on 05/23/2023),  Disp: 3 each, Rfl: 0    Review of Systems  Per HPI unless specifically indicated above     Objective:    BP 124/77   Pulse 60   Temp (!) 97.1 F (36.2 C)   SpO2 97%   Wt Readings from Last 3 Encounters:  04/21/23 218 lb (98.9 kg)  08/26/22 203 lb 12 oz (92.4 kg)  06/24/22 214 lb (97.1 kg)    Physical Exam Constitutional:      General: He is not in acute distress.    Appearance: He is not toxic-appearing.  HENT:     Head: Normocephalic and atraumatic.  Cardiovascular:     Rate and Rhythm: Normal rate and regular rhythm.  Pulmonary:     Effort: Pulmonary effort is normal. No respiratory distress.     Breath sounds: No wheezing or rhonchi.  Neurological:     Mental Status: He is alert and oriented to person, place, and time.  Psychiatric:        Behavior: Behavior normal.          Assessment & Plan:    Encounter Diagnosis  Name Primary?   Essential hypertension Yes     -BP is great today!  Pt will continue current medications -he has screening CT next week. -he submitted cafa but hasn't heard if he  was approved.  Will Check on cafa -his atorvastatin wasn't in his bag.  He is to check when he gets home that he is still taking it -pt to follow up 2 months.  He is to contact office sooner prn

## 2023-06-30 ENCOUNTER — Ambulatory Visit (HOSPITAL_COMMUNITY)
Admission: RE | Admit: 2023-06-30 | Discharge: 2023-06-30 | Disposition: A | Discharge status: Home / Self Care | Payer: Self-pay | Source: Ambulatory Visit | Attending: Physician Assistant | Admitting: Physician Assistant

## 2023-06-30 DIAGNOSIS — F17200 Nicotine dependence, unspecified, uncomplicated: Secondary | ICD-10-CM | POA: Insufficient documentation

## 2023-07-26 ENCOUNTER — Other Ambulatory Visit: Payer: Self-pay | Admitting: Physician Assistant

## 2023-08-03 ENCOUNTER — Other Ambulatory Visit: Payer: Self-pay | Admitting: Physician Assistant

## 2023-08-03 DIAGNOSIS — I10 Essential (primary) hypertension: Secondary | ICD-10-CM

## 2023-08-03 DIAGNOSIS — E785 Hyperlipidemia, unspecified: Secondary | ICD-10-CM

## 2023-08-23 ENCOUNTER — Other Ambulatory Visit (HOSPITAL_COMMUNITY)
Admission: RE | Admit: 2023-08-23 | Discharge: 2023-08-23 | Disposition: A | Discharge status: Home / Self Care | Payer: Self-pay | Source: Ambulatory Visit | Attending: Physician Assistant | Admitting: Physician Assistant

## 2023-08-23 ENCOUNTER — Encounter: Payer: Self-pay | Admitting: Physician Assistant

## 2023-08-23 ENCOUNTER — Ambulatory Visit: Payer: Self-pay | Admitting: Physician Assistant

## 2023-08-23 VITALS — BP 134/76 | HR 68 | Temp 97.8°F | Wt 225.0 lb

## 2023-08-23 DIAGNOSIS — F17200 Nicotine dependence, unspecified, uncomplicated: Secondary | ICD-10-CM

## 2023-08-23 DIAGNOSIS — J449 Chronic obstructive pulmonary disease, unspecified: Secondary | ICD-10-CM

## 2023-08-23 DIAGNOSIS — I10 Essential (primary) hypertension: Secondary | ICD-10-CM | POA: Insufficient documentation

## 2023-08-23 DIAGNOSIS — E785 Hyperlipidemia, unspecified: Secondary | ICD-10-CM | POA: Insufficient documentation

## 2023-08-23 DIAGNOSIS — Z2821 Immunization not carried out because of patient refusal: Secondary | ICD-10-CM

## 2023-08-23 LAB — COMPREHENSIVE METABOLIC PANEL
ALT: 24 U/L (ref 0–44)
AST: 23 U/L (ref 15–41)
Albumin: 3.5 g/dL (ref 3.5–5.0)
Alkaline Phosphatase: 66 U/L (ref 38–126)
Anion gap: 9 (ref 5–15)
BUN: 17 mg/dL (ref 8–23)
CO2: 25 mmol/L (ref 22–32)
Calcium: 8.4 mg/dL — ABNORMAL LOW (ref 8.9–10.3)
Chloride: 101 mmol/L (ref 98–111)
Creatinine, Ser: 1.02 mg/dL (ref 0.61–1.24)
GFR, Estimated: >60 mL/min (ref >60)
Glucose, Bld: 133 mg/dL — ABNORMAL HIGH (ref 70–99)
Potassium: 3.7 mmol/L (ref 3.5–5.1)
Sodium: 135 mmol/L (ref 135–145)
Total Bilirubin: 0.5 mg/dL (ref 0.3–1.2)
Total Protein: 6.7 g/dL (ref 6.5–8.1)

## 2023-08-23 LAB — LIPID PANEL
Cholesterol: 128 mg/dL (ref 0–200)
HDL: 27 mg/dL — ABNORMAL LOW (ref >40)
LDL Cholesterol: 65 mg/dL (ref 0–99)
Total CHOL/HDL Ratio: 4.7 {ratio}
Triglycerides: 178 mg/dL — ABNORMAL HIGH (ref <150)
VLDL: 36 mg/dL (ref 0–40)

## 2023-08-23 MED ORDER — BREZTRI AEROSPHERE 160-9-4.8 MCG/ACT IN AERO: 2.0000 | INHALATION_SPRAY | Freq: Two times a day (BID) | RESPIRATORY_TRACT | Status: DC

## 2023-08-23 NOTE — Progress Notes (Signed)
BP 134/76   Pulse 68   Temp 97.8 F (36.6 C)   Wt 225 lb (102.1 kg)   SpO2 96%   BMI 33.71 kg/m    Subjective:    Patient ID: [name], male    DOB: 1961-03-30, 62 y.o.   MRN: [id number]  HPI: [name] is a 62 y.o. male presenting on 08/23/2023 for Hypertension and Hyperlipidemia   HPI  Chief Complaint  Patient presents with   Hypertension   Hyperlipidemia     Pt is still smoking but says he Cut back to 1 ppd.  He has some sob but  No cp.  He Uses his albuterol every day.  He Didn't do well with advair so discontinued it.    He says other than that, he is doing well.  He hasn't yet gotten his labs drawn.      Relevant past medical, surgical, family and social history reviewed and updated as indicated. Interim medical history since our last visit reviewed. Allergies and medications reviewed and updated.   Current Outpatient Medications:    albuterol (VENTOLIN HFA) 108 (90 Base) MCG/ACT inhaler, INHALE 2 PUFFS BY MOUTH EVERY 6 HOURS AS NEEDED FOR COUGHING, WHEEZING, OR SHORTNESS OF BREATH, Disp: 20.1 g, Rfl: 0   amLODipine (NORVASC) 10 MG tablet, Take 1 tablet by mouth once daily, Disp: 30 tablet, Rfl: 0   aspirin 81 MG chewable tablet, Chew 1 tablet (81 mg total) by mouth 2 (two) times daily. (Patient taking differently: Chew 81 mg by mouth daily.), Disp: 30 tablet, Rfl: 0   atorvastatin (LIPITOR) 80 MG tablet, Take 1 tablet (80 mg total) by mouth daily., Disp: 90 tablet, Rfl: 3   citalopram (CELEXA) 20 MG tablet, TAKE 1 Tablet BY MOUTH ONCE EVERY DAY, Disp: 90 tablet, Rfl: 0   cloNIDine (CATAPRES) 0.1 MG tablet, TAKE 1 Tablet  BY MOUTH TWICE DAILY, Disp: 180 tablet, Rfl: 0   lisinopril (ZESTRIL) 40 MG tablet, TAKE 1 Tablet BY MOUTH ONCE EVERY DAY, Disp: 90 tablet, Rfl: 0   metoprolol tartrate (LOPRESSOR) 100 MG tablet, Take 1 tablet (100 mg total) by mouth 2 (two) times daily., Disp: 60 tablet, Rfl: 0   omeprazole (PRILOSEC) 20 MG capsule, TAKE 1 Capsule BY  MOUTH ONCE EVERY DAY, Disp: 30 capsule, Rfl: 3   fluticasone-salmeterol (ADVAIR HFA) 45-21 MCG/ACT inhaler, Inhale 2 puffs into the lungs 2 (two) times daily. (Patient not taking: Reported on 05/23/2023), Disp: 3 each, Rfl: 0   Review of Systems  Per HPI unless specifically indicated above     Objective:    BP 134/76   Pulse 68   Temp 97.8 F (36.6 C)   Wt 225 lb (102.1 kg)   SpO2 96%   BMI 33.71 kg/m   Wt Readings from Last 3 Encounters:  08/23/23 225 lb (102.1 kg)  04/21/23 218 lb (98.9 kg)  08/26/22 203 lb 12 oz (92.4 kg)    Physical Exam Vitals reviewed.  Constitutional:      General: He is not in acute distress.    Appearance: He is well-developed. He is not toxic-appearing.  HENT:     Head: Normocephalic and atraumatic.  Cardiovascular:     Rate and Rhythm: Normal rate and regular rhythm.  Pulmonary:     Effort: Pulmonary effort is normal. No respiratory distress.     Breath sounds: Wheezing present. No rhonchi or rales.     Comments: Few soft scattered exp wheezes Abdominal:  General: Bowel sounds are normal.     Palpations: Abdomen is soft.     Tenderness: There is no abdominal tenderness.  Musculoskeletal:     Cervical back: Neck supple.     Right lower leg: No edema.     Left lower leg: No edema.  Lymphadenopathy:     Cervical: No cervical adenopathy.  Skin:    General: Skin is warm and dry.  Neurological:     Mental Status: He is alert and oriented to person, place, and time.     Motor: No weakness or tremor.     Gait: Gait is intact.  Psychiatric:        Behavior: Behavior normal.           Assessment & Plan:   Encounter Diagnoses  Name Primary?   Essential hypertension Yes   Hyperlipidemia, unspecified hyperlipidemia type    Chronic obstructive pulmonary disease, unspecified COPD type (HCC)    Tobacco use disorder    Influenza vaccination declined      -pt to get fasting labs drawn.  He will be called with results -will plan to  Call pt 3-4 wk to check on how breztri is working for him -pt declined flu shot -encouraged smoking less or stopping altogether -pt to follow up in office in 3 months.  He is to contact office sooner prn

## 2023-08-23 NOTE — Patient Instructions (Signed)
Breztri 2 puffs twice daily

## 2023-09-07 ENCOUNTER — Other Ambulatory Visit: Payer: Self-pay | Admitting: Physician Assistant

## 2023-09-08 ENCOUNTER — Other Ambulatory Visit: Payer: Self-pay | Admitting: Physician Assistant

## 2023-09-14 ENCOUNTER — Telehealth: Payer: Self-pay | Admitting: Student

## 2023-09-14 NOTE — Telephone Encounter (Signed)
Pt was called to check how breztri was working for him. Pt states he has been using it 1 puff BID and feels no improvements.  PA recommends for pt to continue on Breztri and office will reach out to him again before his f/u in Jan. To check on him.  Pt verbalized understanding.

## 2023-10-06 ENCOUNTER — Encounter: Payer: Self-pay | Admitting: Physician Assistant

## 2023-10-06 ENCOUNTER — Ambulatory Visit: Payer: Self-pay | Admitting: Physician Assistant

## 2023-10-06 VITALS — BP 166/86 | HR 100 | Temp 97.4°F

## 2023-10-06 DIAGNOSIS — F32A Depression, unspecified: Secondary | ICD-10-CM

## 2023-10-06 MED ORDER — AMLODIPINE BESYLATE 10 MG PO TABS: ORAL_TABLET | ORAL | 0 refills | Status: DC

## 2023-10-06 MED ORDER — LISINOPRIL 40 MG PO TABS: ORAL_TABLET | ORAL | 0 refills | Status: DC

## 2023-10-06 MED ORDER — METOPROLOL TARTRATE 100 MG PO TABS: 100.0000 mg | ORAL_TABLET | Freq: Two times a day (BID) | ORAL | 0 refills | Status: DC

## 2023-10-06 MED ORDER — OMEPRAZOLE 20 MG PO CPDR: DELAYED_RELEASE_CAPSULE | ORAL | 0 refills | Status: DC

## 2023-10-06 MED ORDER — CITALOPRAM HYDROBROMIDE 40 MG PO TABS: 40.0000 mg | ORAL_TABLET | Freq: Every day | ORAL | 0 refills | Status: DC

## 2023-10-06 MED ORDER — ALBUTEROL SULFATE HFA 108 (90 BASE) MCG/ACT IN AERS: INHALATION_SPRAY | RESPIRATORY_TRACT | 0 refills | Status: DC

## 2023-10-06 MED ORDER — ATORVASTATIN CALCIUM 80 MG PO TABS: 80.0000 mg | ORAL_TABLET | Freq: Every day | ORAL | 0 refills | Status: DC

## 2023-10-06 MED ORDER — CLONIDINE HCL 0.1 MG PO TABS: 0.1000 mg | ORAL_TABLET | Freq: Two times a day (BID) | ORAL | 0 refills | Status: DC

## 2023-10-06 MED ORDER — BREZTRI AEROSPHERE 160-9-4.8 MCG/ACT IN AERO: 2.0000 | INHALATION_SPRAY | Freq: Two times a day (BID) | RESPIRATORY_TRACT | 0 refills | Status: AC

## 2023-10-06 NOTE — Progress Notes (Signed)
BP (!) 166/86   Pulse 100   Temp (!) 97.4 F (36.3 C)   SpO2 97%    Subjective:    Patient ID: [name], male    DOB: 12/22/1960, 62 y.o.   MRN: [id number]  HPI: [name] is a 62 y.o. male presenting on 10/06/2023 for No chief complaint on file.   HPI  Pt's wife is with him today.    Pt has been having a lot of problems for past maybe 3 months with his mood.  He has Still been going to work.  He says Work is good for him.  He says he Doesn't cry.  He is Not using etoh.  He has no SI or HI.  He just feels like he give up on life.  Like a lack of hope for the future.  He says it seemed to start when his Sister died in early 08/05/23.  Now his Step daughter (who he helped raise from a young age) is in jail.  Pt has no other siblings.    His sleep at night is disturbed- he awakens during the night. He Wants to sleep all day.  He says he is eating okay.  Daughter's legal issues still ongoing and very expensive- she may go away for long time-     Relevant past medical, surgical, family and social history reviewed and updated as indicated. Interim medical history since our last visit reviewed. Allergies and medications reviewed and updated.   Current Outpatient Medications:    albuterol (VENTOLIN HFA) 108 (90 Base) MCG/ACT inhaler, INHALE 2 PUFFS BY MOUTH EVERY 6 HOURS AS NEEDED FOR COUGHING, WHEEZING, OR SHORTNESS OF BREATH, Disp: 20.1 g, Rfl: 0   amLODipine (NORVASC) 10 MG tablet, Take 1 tablet by mouth once daily, Disp: 30 tablet, Rfl: 3   aspirin 81 MG chewable tablet, Chew 1 tablet (81 mg total) by mouth 2 (two) times daily. (Patient taking differently: Chew 81 mg by mouth daily.), Disp: 30 tablet, Rfl: 0   atorvastatin (LIPITOR) 80 MG tablet, Take 1 tablet (80 mg total) by mouth daily., Disp: 90 tablet, Rfl: 3   Budeson-Glycopyrrol-Formoterol (BREZTRI AEROSPHERE) 160-9-4.8 MCG/ACT AERO, Inhale 2 puffs into the lungs 2 (two) times daily., Disp: 1 each, Rfl:     citalopram (CELEXA) 20 MG tablet, TAKE 1 Tablet BY MOUTH ONCE EVERY DAY, Disp: 90 tablet, Rfl: 0   cloNIDine (CATAPRES) 0.1 MG tablet, TAKE 1 Tablet  BY MOUTH TWICE DAILY, Disp: 180 tablet, Rfl: 0   lisinopril (ZESTRIL) 40 MG tablet, TAKE 1 Tablet BY MOUTH ONCE EVERY DAY, Disp: 90 tablet, Rfl: 0   metoprolol tartrate (LOPRESSOR) 100 MG tablet, Take 1 tablet (100 mg total) by mouth 2 (two) times daily., Disp: 60 tablet, Rfl: 0   omeprazole (PRILOSEC) 20 MG capsule, TAKE 1 Capsule BY MOUTH ONCE EVERY DAY, Disp: 90 capsule, Rfl: 0    Review of Systems  Per HPI unless specifically indicated above     Objective:    BP (!) 166/86   Pulse 100   Temp (!) 97.4 F (36.3 C)   SpO2 97%   Wt Readings from Last 3 Encounters:  08/23/23 225 lb (102.1 kg)  04/21/23 218 lb (98.9 kg)  08/26/22 203 lb 12 oz (92.4 kg)    Physical Exam Constitutional:      General: He is not in acute distress.    Appearance: He is not toxic-appearing.  HENT:     Head: Normocephalic and atraumatic.  Pulmonary:     Effort: Pulmonary effort is normal. No respiratory distress.  Neurological:     Mental Status: He is alert and oriented to person, place, and time.  Psychiatric:        Attention and Perception: Attention normal.        Mood and Affect: Mood is depressed. Affect is not inappropriate.        Behavior: Behavior normal. Behavior is cooperative.     Comments: Pt is tearful at times.  He is engaged and participates in discussion.      GAD-7 score 20 PHQ-9 score 22      Assessment & Plan:    Encounter Diagnosis  Name Primary?   Depression, unspecified depression type Yes     -Will increase citalopram to 40mg  -He is scheduled to see [facility] on 10/11/23 -He will f/u with me 10/17/23 -Discussed things that can help him find joy like petting his dog, taking the dog for a walk or playing fetch with it.   -Discussed ways he can get help if needed during hours clinic is closed (dialing [phone], going to  ER)  -Pt is given a video about depression to watch -after talking with pt and wife for 30 minutes, pt appears more upbeat when leaves office and says he can see some hope

## 2023-10-06 NOTE — Patient Instructions (Signed)
Understanding Depression You will learn about depression, including the symptoms, diagnosis and treatment options. To view the content, go to this web address: [email or web address]  This video will expire on: 04/17/2025. If you need access to this video following this date, please reach out to the healthcare provider who assigned it to you. This information is not intended to replace advice given to you by your health care provider. Make sure you discuss any questions you have with your health care provider. Elsevier Patient Education  2024 [employer].

## 2023-10-11 ENCOUNTER — Ambulatory Visit: Payer: Self-pay

## 2023-10-17 ENCOUNTER — Ambulatory Visit: Payer: Self-pay | Admitting: Physician Assistant

## 2023-10-27 ENCOUNTER — Telehealth: Payer: Self-pay | Admitting: Physician Assistant

## 2023-10-27 NOTE — Telephone Encounter (Signed)
 Attempted to call pt to check after recent no-shows for follow up.  No answer.  Left VM asking pt to call office

## 2023-10-31 ENCOUNTER — Telehealth: Payer: Self-pay | Admitting: Physician Assistant

## 2023-10-31 ENCOUNTER — Other Ambulatory Visit: Payer: Self-pay | Admitting: Physician Assistant

## 2023-10-31 DIAGNOSIS — I10 Essential (primary) hypertension: Secondary | ICD-10-CM

## 2023-10-31 DIAGNOSIS — E785 Hyperlipidemia, unspecified: Secondary | ICD-10-CM

## 2023-10-31 NOTE — Telephone Encounter (Signed)
 Pt wife called to cancel pt appt tomorrow.  She says pt is doing much better on medication.  She says he will be in for appt 11/23/23; he is working out of town for next two weeks.

## 2023-11-01 ENCOUNTER — Ambulatory Visit: Payer: Self-pay

## 2023-11-02 ENCOUNTER — Other Ambulatory Visit: Payer: Self-pay | Admitting: Physician Assistant

## 2023-11-23 ENCOUNTER — Ambulatory Visit: Payer: Self-pay | Admitting: Physician Assistant

## 2023-12-01 ENCOUNTER — Ambulatory Visit: Payer: Self-pay | Admitting: Physician Assistant

## 2023-12-01 ENCOUNTER — Encounter: Payer: Self-pay | Admitting: Physician Assistant

## 2023-12-01 VITALS — BP 124/76 | HR 64 | Temp 98.2°F | Wt 216.0 lb

## 2023-12-01 DIAGNOSIS — F32A Depression, unspecified: Secondary | ICD-10-CM

## 2023-12-01 DIAGNOSIS — E785 Hyperlipidemia, unspecified: Secondary | ICD-10-CM

## 2023-12-01 DIAGNOSIS — I10 Essential (primary) hypertension: Secondary | ICD-10-CM

## 2023-12-01 DIAGNOSIS — F17200 Nicotine dependence, unspecified, uncomplicated: Secondary | ICD-10-CM

## 2023-12-01 DIAGNOSIS — J449 Chronic obstructive pulmonary disease, unspecified: Secondary | ICD-10-CM

## 2023-12-01 NOTE — Progress Notes (Signed)
 BP 124/76   Pulse 64   Temp 98.2 F (36.8 C)   Wt 216 lb (98 kg)   SpO2 96%   BMI 32.36 kg/m    Subjective:    Patient ID: [name], male    DOB: 1961/07/20, 63 y.o.   MRN: [id number]  HPI: [name] is a 63 y.o. male presenting on 12/01/2023 for Hypertension, Hyperlipidemia, COPD, and mood   HPI   Chief Complaint  Patient presents with   Hypertension   Hyperlipidemia   COPD   mood    Pt says he is doing well.  He has been working out of town a lot recently but hopes to be home for a while now.  He says his mood has been good.  He denies dyspnea.  He is using his inhalers but continues to smoke.  He has no complaints today.    Relevant past medical, surgical, family and social history reviewed and updated as indicated. Interim medical history since our last visit reviewed. Allergies and medications reviewed and updated.   Current Outpatient Medications:    albuterol  (VENTOLIN  HFA) 108 (90 Base) MCG/ACT inhaler, INHALE 2 PUFFS BY MOUTH EVERY 6 HOURS AS NEEDED FOR COUGHING, WHEEZING, OR SHORTNESS OF BREATH, Disp: 20.1 g, Rfl: 0   amLODipine  (NORVASC ) 10 MG tablet, Take 1 tablet by mouth once daily, Disp: 90 tablet, Rfl: 0   aspirin  81 MG chewable tablet, Chew 1 tablet (81 mg total) by mouth 2 (two) times daily. (Patient taking differently: Chew 81 mg by mouth daily.), Disp: 30 tablet, Rfl: 0   atorvastatin  (LIPITOR) 80 MG tablet, Take 1 tablet (80 mg total) by mouth daily., Disp: 90 tablet, Rfl: 0   Budeson-Glycopyrrol-Formoterol (BREZTRI  AEROSPHERE) 160-9-4.8 MCG/ACT AERO, Inhale 2 puffs into the lungs 2 (two) times daily., Disp: 3 each, Rfl: 0   citalopram  (CELEXA ) 40 MG tablet, Take 1 tablet by mouth once daily, Disp: 30 tablet, Rfl: 0   cloNIDine  (CATAPRES ) 0.1 MG tablet, Take 1 tablet (0.1 mg total) by mouth 2 (two) times daily., Disp: 180 tablet, Rfl: 0   lisinopril  (ZESTRIL ) 40 MG tablet, TAKE 1 Tablet BY MOUTH ONCE EVERY DAY, Disp: 90 tablet, Rfl: 0    metoprolol  tartrate (LOPRESSOR ) 100 MG tablet, Take 1 tablet (100 mg total) by mouth 2 (two) times daily., Disp: 180 tablet, Rfl: 0   omeprazole  (PRILOSEC ) 20 MG capsule, TAKE 1 Capsule BY MOUTH ONCE EVERY DAY, Disp: 90 capsule, Rfl: 0    Review of Systems  Per HPI unless specifically indicated above     Objective:    BP 124/76   Pulse 64   Temp 98.2 F (36.8 C)   Wt 216 lb (98 kg)   SpO2 96%   BMI 32.36 kg/m   Wt Readings from Last 3 Encounters:  12/01/23 216 lb (98 kg)  08/23/23 225 lb (102.1 kg)  04/21/23 218 lb (98.9 kg)    Physical Exam Vitals reviewed.  Constitutional:      General: He is not in acute distress.    Appearance: He is well-developed. He is not toxic-appearing.  HENT:     Head: Normocephalic and atraumatic.  Cardiovascular:     Rate and Rhythm: Normal rate and regular rhythm.  Pulmonary:     Effort: Pulmonary effort is normal. No respiratory distress.     Breath sounds: No stridor. Wheezing present. No rhonchi or rales.     Comments: Soft scattered expiratory wheezes Abdominal:  General: Bowel sounds are normal.     Palpations: Abdomen is soft.     Tenderness: There is no abdominal tenderness.  Musculoskeletal:     Cervical back: Neck supple.     Right lower leg: No edema.     Left lower leg: No edema.  Lymphadenopathy:     Cervical: No cervical adenopathy.  Skin:    General: Skin is warm and dry.  Neurological:     Mental Status: He is alert and oriented to person, place, and time.  Psychiatric:        Behavior: Behavior normal.           Assessment & Plan:    Encounter Diagnoses  Name Primary?   Essential hypertension Yes   Hyperlipidemia, unspecified hyperlipidemia type    Depression, unspecified depression type    Chronic obstructive pulmonary disease, unspecified COPD type (HCC)    Tobacco use disorder      -pt to get fasting labs drawn.  He will be called with results -pt to continue current  medications -encouraged smoking cessation -pt to follow up 3 months.  He is to contact office sooner prn

## 2024-01-05 ENCOUNTER — Encounter: Payer: Self-pay | Admitting: Physician Assistant

## 2024-01-05 ENCOUNTER — Ambulatory Visit: Payer: Self-pay | Admitting: Physician Assistant

## 2024-01-05 VITALS — BP 122/78 | HR 64 | Temp 97.8°F

## 2024-01-05 DIAGNOSIS — I700 Atherosclerosis of aorta: Secondary | ICD-10-CM

## 2024-01-05 DIAGNOSIS — F17200 Nicotine dependence, unspecified, uncomplicated: Secondary | ICD-10-CM

## 2024-01-05 DIAGNOSIS — R9431 Abnormal electrocardiogram [ECG] [EKG]: Secondary | ICD-10-CM

## 2024-01-05 DIAGNOSIS — I10 Essential (primary) hypertension: Secondary | ICD-10-CM

## 2024-01-05 DIAGNOSIS — E785 Hyperlipidemia, unspecified: Secondary | ICD-10-CM

## 2024-01-05 DIAGNOSIS — R7303 Prediabetes: Secondary | ICD-10-CM

## 2024-01-05 DIAGNOSIS — R55 Syncope and collapse: Secondary | ICD-10-CM

## 2024-01-05 DIAGNOSIS — Z125 Encounter for screening for malignant neoplasm of prostate: Secondary | ICD-10-CM

## 2024-01-05 DIAGNOSIS — J449 Chronic obstructive pulmonary disease, unspecified: Secondary | ICD-10-CM

## 2024-01-05 NOTE — Progress Notes (Signed)
 BP 122/78   Pulse 64   Temp 97.8 F (36.6 C)   SpO2 98%    Subjective:    Patient ID: [name], male    DOB: [birth year], 63 y.o.   MRN: [id number]  HPI: [name] is a 63 y.o. male presenting on 01/05/2024 for No chief complaint on file.   HPI  Pt is 62yoM in today for evaluation of syncopal episode.  Last Friday he was eating a hamburger and had LOC- lasted about a minute.  He says he feels like he was choking before he lost consciousness.  He says his friends just shook him.  Afterwards he felt a little nauseous but then he was alright.  He does not report any CP.   Pt is 62yoM with htn, dyslipidemia, copd, 66pack/year history smoking, history CVA, atherosclerosis seen on CT scan.   He gets DOE but this is unchanged with his emphysema.   He says he is feeling well today. He denies any globus sensation.   Pt did not update his labs in January as recommended.     Relevant past medical, surgical, family and social history reviewed and updated as indicated. Interim medical history since our last visit reviewed. Allergies and medications reviewed and updated.   Current Outpatient Medications:    albuterol (VENTOLIN HFA) 108 (90 Base) MCG/ACT inhaler, INHALE 2 PUFFS BY MOUTH EVERY 6 HOURS AS NEEDED FOR COUGHING, WHEEZING, OR SHORTNESS OF BREATH, Disp: 20.1 g, Rfl: 0   amLODipine (NORVASC) 10 MG tablet, Take 1 tablet by mouth once daily, Disp: 90 tablet, Rfl: 0   aspirin 81 MG chewable tablet, Chew 1 tablet (81 mg total) by mouth 2 (two) times daily. (Patient taking differently: Chew 81 mg by mouth daily.), Disp: 30 tablet, Rfl: 0   atorvastatin (LIPITOR) 80 MG tablet, Take 1 tablet (80 mg total) by mouth daily., Disp: 90 tablet, Rfl: 0   Budeson-Glycopyrrol-Formoterol (BREZTRI AEROSPHERE) 160-9-4.8 MCG/ACT AERO, Inhale 2 puffs into the lungs 2 (two) times daily., Disp: 3 each, Rfl: 0   citalopram (CELEXA) 40 MG tablet, Take 1 tablet by mouth once daily, Disp: 30 tablet, Rfl: 0    cloNIDine (CATAPRES) 0.1 MG tablet, Take 1 tablet (0.1 mg total) by mouth 2 (two) times daily., Disp: 180 tablet, Rfl: 0   lisinopril (ZESTRIL) 40 MG tablet, TAKE 1 Tablet BY MOUTH ONCE EVERY DAY, Disp: 90 tablet, Rfl: 0   metoprolol tartrate (LOPRESSOR) 100 MG tablet, Take 1 tablet (100 mg total) by mouth 2 (two) times daily., Disp: 180 tablet, Rfl: 0   omeprazole (PRILOSEC) 20 MG capsule, TAKE 1 Capsule BY MOUTH ONCE EVERY DAY, Disp: 90 capsule, Rfl: 0    Review of Systems  Per HPI unless specifically indicated above     Objective:    BP 122/78   Pulse 64   Temp 97.8 F (36.6 C)   SpO2 98%   Wt Readings from Last 3 Encounters:  12/01/23 216 lb (98 kg)  08/23/23 225 lb (102.1 kg)  04/21/23 218 lb (98.9 kg)    Physical Exam Vitals reviewed.  Constitutional:      General: He is not in acute distress.    Appearance: He is well-developed. He is not toxic-appearing.  HENT:     Head: Normocephalic and atraumatic.  Cardiovascular:     Rate and Rhythm: Normal rate and regular rhythm.  Pulmonary:     Effort: Pulmonary effort is normal.     Breath sounds: Normal breath  sounds. No wheezing.  Abdominal:     General: Bowel sounds are normal.     Palpations: Abdomen is soft.     Tenderness: There is no abdominal tenderness.  Musculoskeletal:     Cervical back: Neck supple.     Right lower leg: No edema.     Left lower leg: No edema.  Lymphadenopathy:     Cervical: No cervical adenopathy.  Skin:    General: Skin is warm and dry.  Neurological:     Mental Status: He is alert and oriented to person, place, and time.     Cranial Nerves: No facial asymmetry.     Motor: No tremor.     Gait: Gait normal.  Psychiatric:        Behavior: Behavior normal.        EKG- SR at 63bpm with left axis deviation and possible old septal infarct.  New st-t changes lateral leads compared with EKG 04/24/20     Assessment & Plan:   Encounter Diagnoses  Name Primary?   Syncope,  unspecified syncope type Yes   Essential hypertension    Hyperlipidemia, unspecified hyperlipidemia type    Chronic obstructive pulmonary disease, unspecified COPD type (HCC)    Tobacco use disorder    Prediabetes    Atherosclerosis of aorta (HCC)    Abnormal EKG    Screening for prostate cancer       -Refer to cardiology for evaluation in light of significant risk factors with LOC and EKG changes -Pt has cafa 75% through 01/26/24 -Pt has routine follow up here 03/01/24.  He is to contact office for more LOC episodes or for other changes/new symptoms

## 2024-01-11 ENCOUNTER — Other Ambulatory Visit: Payer: Self-pay | Admitting: Physician Assistant

## 2024-02-13 ENCOUNTER — Other Ambulatory Visit: Payer: Self-pay | Admitting: Physician Assistant

## 2024-03-01 ENCOUNTER — Ambulatory Visit: Payer: Self-pay | Admitting: Physician Assistant

## 2024-03-01 ENCOUNTER — Other Ambulatory Visit (HOSPITAL_COMMUNITY)
Admission: RE | Admit: 2024-03-01 | Discharge: 2024-03-01 | Disposition: A | Discharge status: Home / Self Care | Payer: Self-pay | Source: Ambulatory Visit | Attending: Physician Assistant | Admitting: Physician Assistant

## 2024-03-01 ENCOUNTER — Encounter: Payer: Self-pay | Admitting: Physician Assistant

## 2024-03-01 VITALS — BP 129/80 | HR 65 | Temp 97.8°F | Wt 217.5 lb

## 2024-03-01 DIAGNOSIS — J449 Chronic obstructive pulmonary disease, unspecified: Secondary | ICD-10-CM

## 2024-03-01 DIAGNOSIS — R7303 Prediabetes: Secondary | ICD-10-CM | POA: Insufficient documentation

## 2024-03-01 DIAGNOSIS — I10 Essential (primary) hypertension: Secondary | ICD-10-CM | POA: Insufficient documentation

## 2024-03-01 DIAGNOSIS — F17200 Nicotine dependence, unspecified, uncomplicated: Secondary | ICD-10-CM

## 2024-03-01 DIAGNOSIS — E785 Hyperlipidemia, unspecified: Secondary | ICD-10-CM

## 2024-03-01 DIAGNOSIS — Z125 Encounter for screening for malignant neoplasm of prostate: Secondary | ICD-10-CM | POA: Insufficient documentation

## 2024-03-01 DIAGNOSIS — I700 Atherosclerosis of aorta: Secondary | ICD-10-CM | POA: Insufficient documentation

## 2024-03-01 LAB — COMPREHENSIVE METABOLIC PANEL WITH GFR
ALT: 17 U/L (ref 0–44)
AST: 18 U/L (ref 15–41)
Albumin: 3.6 g/dL (ref 3.5–5.0)
Alkaline Phosphatase: 65 U/L (ref 38–126)
Anion gap: 9 (ref 5–15)
BUN: 13 mg/dL (ref 8–23)
CO2: 25 mmol/L (ref 22–32)
Calcium: 8.9 mg/dL (ref 8.9–10.3)
Chloride: 101 mmol/L (ref 98–111)
Creatinine, Ser: 1 mg/dL (ref 0.61–1.24)
GFR, Estimated: >60 mL/min (ref >60)
Glucose, Bld: 111 mg/dL — ABNORMAL HIGH (ref 70–99)
Potassium: 4.1 mmol/L (ref 3.5–5.1)
Sodium: 135 mmol/L (ref 135–145)
Total Bilirubin: 0.5 mg/dL (ref 0.0–1.2)
Total Protein: 6.8 g/dL (ref 6.5–8.1)

## 2024-03-01 LAB — LIPID PANEL
Cholesterol: 169 mg/dL (ref 0–200)
HDL: 32 mg/dL — ABNORMAL LOW (ref >40)
LDL Cholesterol: 100 mg/dL — ABNORMAL HIGH (ref 0–99)
Total CHOL/HDL Ratio: 5.3 ratio
Triglycerides: 184 mg/dL — ABNORMAL HIGH (ref <150)
VLDL: 37 mg/dL (ref 0–40)

## 2024-03-01 LAB — PSA: Prostatic Specific Antigen: 0.53 ng/mL (ref 0.00–4.00)

## 2024-03-01 NOTE — Progress Notes (Signed)
 BP 129/80   Pulse 65   Temp 97.8 F (36.6 C)   Wt 217 lb 8 oz (98.7 kg)   SpO2 98%   BMI 32.59 kg/m    Subjective:    Patient ID: [name], male    DOB: [birth year], 63 y.o.   MRN: [id number]  HPI: [name] is a 63 y.o. male presenting on 03/01/2024 for Hypertension, Hyperlipidemia, and COPD   HPI   Chief Complaint  Patient presents with   Hypertension   Hyperlipidemia   COPD    Pt says he is doing well and he has no complaints.  He continues to smoke about 1 ppd.  He says his breathing is fine as long as he uses his inhalers.  He says his mood is stable.    Relevant past medical, surgical, family and social history reviewed and updated as indicated. Interim medical history since our last visit reviewed. Allergies and medications reviewed and updated.    Current Outpatient Medications:    albuterol  (VENTOLIN  HFA) 108 (90 Base) MCG/ACT inhaler, INHALE 2 PUFFS BY MOUTH EVERY 6 HOURS AS NEEDED FOR COUGHING, WHEEZING, OR SHORTNESS OF BREATH, Disp: 20.1 g, Rfl: 0   amLODipine  (NORVASC ) 10 MG tablet, TAKE 1 Tablet BY MOUTH ONCE EVERY DAY, Disp: 90 tablet, Rfl: 0   aspirin  81 MG chewable tablet, Chew 1 tablet (81 mg total) by mouth 2 (two) times daily. (Patient taking differently: Chew 81 mg by mouth daily.), Disp: 30 tablet, Rfl: 0   atorvastatin  (LIPITOR) 80 MG tablet, TAKE 1 Tablet BY MOUTH ONCE EVERY DAY, Disp: 90 tablet, Rfl: 0   Budeson-Glycopyrrol-Formoterol (BREZTRI  AEROSPHERE) 160-9-4.8 MCG/ACT AERO, Inhale 2 puffs into the lungs 2 (two) times daily., Disp: 3 each, Rfl: 0   citalopram  (CELEXA ) 40 MG tablet, TAKE 1 Tablet BY MOUTH ONCE EVERY DAY, Disp: 90 tablet, Rfl: 0   cloNIDine  (CATAPRES ) 0.1 MG tablet, TAKE 1 Tablet  BY MOUTH TWICE DAILY, Disp: 180 tablet, Rfl: 0   lisinopril  (ZESTRIL ) 40 MG tablet, TAKE 1 Tablet BY MOUTH ONCE EVERY DAY, Disp: 90 tablet, Rfl: 0   metoprolol  tartrate (LOPRESSOR ) 100 MG tablet, TAKE 1 Tablet  BY MOUTH TWICE DAILY, Disp: 180  tablet, Rfl: 0   omeprazole  (PRILOSEC ) 20 MG capsule, TAKE 1 Capsule BY MOUTH ONCE EVERY DAY, Disp: 90 capsule, Rfl: 0   Review of Systems  Per HPI unless specifically indicated above     Objective:     BP 129/80   Pulse 65   Temp 97.8 F (36.6 C)   Wt 217 lb 8 oz (98.7 kg)   SpO2 98%   BMI 32.59 kg/m   Wt Readings from Last 3 Encounters:  03/01/24 217 lb 8 oz (98.7 kg)  12/01/23 216 lb (98 kg)  08/23/23 225 lb (102.1 kg)    Physical Exam Vitals reviewed.  Constitutional:      General: He is not in acute distress.    Appearance: He is not toxic-appearing.     Comments: Smells very strongly of smoke  HENT:     Head: Normocephalic and atraumatic.  Cardiovascular:     Rate and Rhythm: Normal rate and regular rhythm.  Pulmonary:     Effort: Pulmonary effort is normal. No respiratory distress.     Breath sounds: Wheezing present. No rhonchi.     Comments: Soft scattered exp wheezes Abdominal:     General: Bowel sounds are normal.     Palpations: Abdomen is soft.  Tenderness: There is no abdominal tenderness.  Musculoskeletal:     Cervical back: Neck supple.     Right lower leg: No edema.     Left lower leg: No edema.  Lymphadenopathy:     Cervical: No cervical adenopathy.  Skin:    General: Skin is warm and dry.     Coloration: Skin is not jaundiced.  Neurological:     Mental Status: He is alert and oriented to person, place, and time.  Psychiatric:        Behavior: Behavior normal.     Results for orders placed or performed during the hospital encounter of 03/01/24  Lipid panel   Collection Time: 03/01/24  9:25 AM  Result Value Ref Range   Cholesterol 169 0 - 200 mg/dL   Triglycerides 161 (H) <150 mg/dL   HDL 32 (L) >09 mg/dL   Total CHOL/HDL Ratio 5.3 RATIO   VLDL 37 0 - 40 mg/dL   LDL Cholesterol 604 (H) 0 - 99 mg/dL  Comprehensive metabolic panel   Collection Time: 03/01/24  9:25 AM  Result Value Ref Range   Sodium 135 135 - 145 mmol/L    Potassium 4.1 3.5 - 5.1 mmol/L   Chloride 101 98 - 111 mmol/L   CO2 25 22 - 32 mmol/L   Glucose, Bld 111 (H) 70 - 99 mg/dL   BUN 13 8 - 23 mg/dL   Creatinine, Ser 5.40 0.61 - 1.24 mg/dL   Calcium  8.9 8.9 - 10.3 mg/dL   Total Protein 6.8 6.5 - 8.1 g/dL   Albumin 3.6 3.5 - 5.0 g/dL   AST 18 15 - 41 U/L   ALT 17 0 - 44 U/L   Alkaline Phosphatase 65 38 - 126 U/L   Total Bilirubin 0.5 0.0 - 1.2 mg/dL   GFR, Estimated >98 >11 mL/min   Anion gap 9 5 - 15      Assessment & Plan:    Encounter Diagnoses  Name Primary?   Chronic obstructive pulmonary disease, unspecified COPD type (HCC) Yes   Essential hypertension    Hyperlipidemia, unspecified hyperlipidemia type    Tobacco use disorder       -reviewed labs with pt.  PSA pending -pt to continue current medications -encouraged smokiing cessation or cutting back -pt to follow up 4 months. He is to contact office sooner prn

## 2024-03-07 ENCOUNTER — Ambulatory Visit: Payer: Self-pay | Admitting: Physician Assistant

## 2024-03-09 ENCOUNTER — Ambulatory Visit: Payer: Self-pay | Attending: Internal Medicine | Admitting: Internal Medicine

## 2024-03-09 NOTE — Progress Notes (Signed)
 Erroneous encounter - please disregard.

## 2024-05-11 ENCOUNTER — Encounter: Payer: Self-pay | Admitting: Advanced Practice Midwife

## 2024-05-30 ENCOUNTER — Other Ambulatory Visit: Payer: Self-pay | Admitting: Physician Assistant

## 2024-05-30 MED ORDER — OMEPRAZOLE 20 MG PO CPDR: DELAYED_RELEASE_CAPSULE | ORAL | 0 refills | Status: DC

## 2024-05-30 MED ORDER — LISINOPRIL 40 MG PO TABS: ORAL_TABLET | ORAL | 0 refills | Status: DC

## 2024-06-26 ENCOUNTER — Telehealth: Payer: Self-pay | Admitting: Physician Assistant

## 2024-06-26 NOTE — Telephone Encounter (Signed)
 Pt's wife called stating pt was having trouble keeping his balance.  She says it started Saturday but he went to work today and he contacted her saying he is getting worse.  It was recommended that she pick up her husband from work and take him to ER for evaluation. Wife is aware that could be symptom of cva.  She says she will try to get him to go.

## 2024-07-04 ENCOUNTER — Ambulatory Visit: Payer: Self-pay | Admitting: Physician Assistant

## 2024-07-11 ENCOUNTER — Ambulatory Visit: Payer: Self-pay | Admitting: Physician Assistant

## 2024-07-19 ENCOUNTER — Encounter: Payer: Self-pay | Admitting: Physician Assistant

## 2024-07-19 ENCOUNTER — Ambulatory Visit: Payer: Self-pay | Admitting: Physician Assistant

## 2024-07-19 ENCOUNTER — Other Ambulatory Visit: Payer: Self-pay | Admitting: Physician Assistant

## 2024-07-19 VITALS — BP 130/70 | HR 76 | Temp 98.4°F | Wt 215.0 lb

## 2024-07-19 DIAGNOSIS — Z1211 Encounter for screening for malignant neoplasm of colon: Secondary | ICD-10-CM

## 2024-07-19 DIAGNOSIS — E785 Hyperlipidemia, unspecified: Secondary | ICD-10-CM

## 2024-07-19 DIAGNOSIS — J449 Chronic obstructive pulmonary disease, unspecified: Secondary | ICD-10-CM

## 2024-07-19 DIAGNOSIS — F17200 Nicotine dependence, unspecified, uncomplicated: Secondary | ICD-10-CM

## 2024-07-19 DIAGNOSIS — L089 Local infection of the skin and subcutaneous tissue, unspecified: Secondary | ICD-10-CM

## 2024-07-19 DIAGNOSIS — I10 Essential (primary) hypertension: Secondary | ICD-10-CM

## 2024-07-19 DIAGNOSIS — W540XXA Bitten by dog, initial encounter: Secondary | ICD-10-CM

## 2024-07-19 MED ORDER — SULFAMETHOXAZOLE-TRIMETHOPRIM 800-160 MG PO TABS: 1.0000 | ORAL_TABLET | Freq: Two times a day (BID) | ORAL | 0 refills | Status: AC

## 2024-07-19 MED ORDER — CLINDAMYCIN HCL 300 MG PO CAPS: 300.0000 mg | ORAL_CAPSULE | Freq: Four times a day (QID) | ORAL | 0 refills | Status: AC

## 2024-07-19 NOTE — Progress Notes (Signed)
 BP 130/70   Pulse 76   Temp 98.4 F (36.9 C)   Wt 215 lb (97.5 kg)   SpO2 97%   BMI 32.22 kg/m    Subjective:    Patient ID: [name], male    DOB: 10/03/1961, 63 y.o.   MRN: [id number]  HPI: [name] is a 63 y.o. male presenting on 07/19/2024 for COPD, Hypertension, and Mental Health Problem   HPI  Chief Complaint  Patient presents with   COPD   Hypertension   Mental Health Problem    Pt says he is doing well.  He is Using albuterol  usually once daily.   He didn't go to cardiology appointment.  He isn't having any more LOC  He got bitten by a pit bull dog about two weeks ago.  He says he knows that the dog had its rabies vaccination.  He says the dog is now deceased.  He has been pouring peroxide on the wound to keep it clean.   He says his mood has been good.      Relevant past medical, surgical, family and social history reviewed and updated as indicated. Interim medical history since our last visit reviewed. Allergies and medications reviewed and updated.   Current Outpatient Medications:    albuterol  (VENTOLIN  HFA) 108 (90 Base) MCG/ACT inhaler, INHALE 2 PUFFS BY MOUTH EVERY 6 HOURS AS NEEDED FOR COUGHING, WHEEZING, OR SHORTNESS OF BREATH, Disp: 20.1 g, Rfl: 0   amLODipine  (NORVASC ) 10 MG tablet, TAKE 1 Tablet BY MOUTH ONCE EVERY DAY, Disp: 90 tablet, Rfl: 0   aspirin  81 MG chewable tablet, Chew 1 tablet (81 mg total) by mouth 2 (two) times daily. (Patient taking differently: Chew 81 mg by mouth daily.), Disp: 30 tablet, Rfl: 0   atorvastatin  (LIPITOR) 80 MG tablet, TAKE 1 Tablet BY MOUTH ONCE EVERY DAY, Disp: 90 tablet, Rfl: 0   citalopram  (CELEXA ) 40 MG tablet, TAKE 1 Tablet BY MOUTH ONCE EVERY DAY, Disp: 90 tablet, Rfl: 0   cloNIDine  (CATAPRES ) 0.1 MG tablet, TAKE 1 Tablet  BY MOUTH TWICE DAILY, Disp: 180 tablet, Rfl: 0   lisinopril  (ZESTRIL ) 40 MG tablet, TAKE 1 Tablet BY MOUTH ONCE EVERY DAY, Disp: 30 tablet, Rfl: 0   metoprolol  tartrate (LOPRESSOR )  100 MG tablet, TAKE 1 Tablet  BY MOUTH TWICE DAILY, Disp: 180 tablet, Rfl: 0   omeprazole  (PRILOSEC ) 20 MG capsule, TAKE 1 Capsule BY MOUTH ONCE EVERY DAY, Disp: 30 capsule, Rfl: 0   Budeson-Glycopyrrol-Formoterol (BREZTRI  AEROSPHERE) 160-9-4.8 MCG/ACT AERO, Inhale 2 puffs into the lungs 2 (two) times daily. (Patient not taking: Reported on 07/19/2024), Disp: 3 each, Rfl: 0   Review of Systems  Per HPI unless specifically indicated above     Objective:    BP 130/70   Pulse 76   Temp 98.4 F (36.9 C)   Wt 215 lb (97.5 kg)   SpO2 97%   BMI 32.22 kg/m   Wt Readings from Last 3 Encounters:  07/19/24 215 lb (97.5 kg)  03/01/24 217 lb 8 oz (98.7 kg)  12/01/23 216 lb (98 kg)    Physical Exam Vitals reviewed.  Constitutional:      General: He is not in acute distress.    Appearance: He is well-developed. He is not toxic-appearing.  HENT:     Head: Normocephalic and atraumatic.  Cardiovascular:     Rate and Rhythm: Normal rate and regular rhythm.  Pulmonary:     Effort: Pulmonary effort is normal.  Breath sounds: Normal breath sounds. No wheezing.  Abdominal:     General: Bowel sounds are normal.     Palpations: Abdomen is soft.     Tenderness: There is no abdominal tenderness.  Musculoskeletal:     Cervical back: Neck supple.     Right lower leg: No edema.     Left lower leg: No edema.     Comments: Right arm with swelling and multiple scabs.  No abscess or purulent discharge  Lymphadenopathy:     Cervical: No cervical adenopathy.  Skin:    General: Skin is warm and dry.  Neurological:     Mental Status: He is alert and oriented to person, place, and time.  Psychiatric:        Behavior: Behavior normal.                Assessment & Plan:    Encounter Diagnoses  Name Primary?   Essential hypertension Yes   Chronic obstructive pulmonary disease, unspecified COPD type (HCC)    Hyperlipidemia, unspecified hyperlipidemia type    Tobacco use disorder     Dog bite, initial encounter    Wound infection    Screening for colon cancer       -pt to continue current medications -pt is given FIT test for colon cancer screening -due to pt allergy to PCN, he was given rx clindamycin  & septra  ds.  He is to discontinue H2O2.  He is to clean wounds with soap and water.  He is to contact office right away for worsening -did not notify animal control due to fact that dog has been dead for two weeks (it was put down by owner & pt) -pt to follow up four months.  Will update labs before that appointment.  He is to contact office sooner prn

## 2024-09-24 ENCOUNTER — Other Ambulatory Visit: Payer: Self-pay | Admitting: Physician Assistant

## 2024-11-07 ENCOUNTER — Other Ambulatory Visit: Payer: Self-pay | Admitting: Physician Assistant

## 2024-11-07 DIAGNOSIS — I10 Essential (primary) hypertension: Secondary | ICD-10-CM

## 2024-11-07 DIAGNOSIS — R7303 Prediabetes: Secondary | ICD-10-CM

## 2024-11-07 DIAGNOSIS — I700 Atherosclerosis of aorta: Secondary | ICD-10-CM

## 2024-11-07 DIAGNOSIS — E785 Hyperlipidemia, unspecified: Secondary | ICD-10-CM

## 2024-11-21 ENCOUNTER — Ambulatory Visit: Payer: Self-pay | Admitting: Physician Assistant

## 2024-11-29 ENCOUNTER — Ambulatory Visit: Payer: Self-pay | Admitting: Physician Assistant
